# Patient Record
Sex: Male | Born: 1960 | Race: Black or African American | Hispanic: No | Smoking: Current every day smoker
Health system: Southern US, Community
[De-identification: ages and names within clinical notes are randomized; demographics above are authoritative.]

## PROBLEM LIST (undated history)

## (undated) DIAGNOSIS — R519 Headache, unspecified: Secondary | ICD-10-CM

## (undated) DIAGNOSIS — R51 Headache: Secondary | ICD-10-CM

## (undated) DIAGNOSIS — K219 Gastro-esophageal reflux disease without esophagitis: Secondary | ICD-10-CM

## (undated) DIAGNOSIS — R351 Nocturia: Secondary | ICD-10-CM

## (undated) DIAGNOSIS — R972 Elevated prostate specific antigen [PSA]: Secondary | ICD-10-CM

## (undated) DIAGNOSIS — Z973 Presence of spectacles and contact lenses: Secondary | ICD-10-CM

## (undated) DIAGNOSIS — C61 Malignant neoplasm of prostate: Secondary | ICD-10-CM

## (undated) DIAGNOSIS — I10 Essential (primary) hypertension: Secondary | ICD-10-CM

## (undated) HISTORY — PX: HERNIA REPAIR: SHX51

---

## 2002-01-31 ENCOUNTER — Encounter: Payer: Self-pay | Admitting: Emergency Medicine

## 2002-01-31 ENCOUNTER — Emergency Department (HOSPITAL_COMMUNITY): Admission: EM | Admit: 2002-01-31 | Discharge: 2002-01-31 | Payer: Self-pay | Admitting: *Deleted

## 2002-11-10 ENCOUNTER — Emergency Department (HOSPITAL_COMMUNITY): Admission: EM | Admit: 2002-11-10 | Discharge: 2002-11-10 | Payer: Self-pay | Admitting: Emergency Medicine

## 2008-01-19 ENCOUNTER — Emergency Department (HOSPITAL_COMMUNITY): Admission: EM | Admit: 2008-01-19 | Discharge: 2008-01-19 | Payer: Self-pay | Admitting: Emergency Medicine

## 2008-10-30 ENCOUNTER — Emergency Department (HOSPITAL_COMMUNITY): Admission: EM | Admit: 2008-10-30 | Discharge: 2008-10-30 | Payer: Self-pay | Admitting: Emergency Medicine

## 2008-11-04 ENCOUNTER — Emergency Department (HOSPITAL_COMMUNITY): Admission: EM | Admit: 2008-11-04 | Discharge: 2008-11-04 | Payer: Self-pay | Admitting: Emergency Medicine

## 2008-12-03 ENCOUNTER — Emergency Department (HOSPITAL_COMMUNITY): Admission: EM | Admit: 2008-12-03 | Discharge: 2008-12-03 | Payer: Self-pay | Admitting: Emergency Medicine

## 2014-10-09 ENCOUNTER — Other Ambulatory Visit: Payer: Self-pay | Admitting: Urology

## 2014-10-14 ENCOUNTER — Encounter (HOSPITAL_BASED_OUTPATIENT_CLINIC_OR_DEPARTMENT_OTHER): Payer: Self-pay | Admitting: *Deleted

## 2014-10-14 NOTE — Progress Notes (Addendum)
NPO AFTER MN. ARRIVE AT 0945. NEEDS ISTAT AND EKG. WILL DO FLEET ENEMA AM DOS. PT TO CALL BACK WITH NAME OF BP MED. NAME.  PT CALLED BACK WITH BP NAME.  HE WILL TAKE NORVASC AM DOS W/ SIP OF WATER.

## 2014-10-16 ENCOUNTER — Ambulatory Visit (HOSPITAL_BASED_OUTPATIENT_CLINIC_OR_DEPARTMENT_OTHER): Admission: RE | Admit: 2014-10-16 | Payer: 59 | Source: Ambulatory Visit | Admitting: Urology

## 2014-10-16 HISTORY — DX: Presence of spectacles and contact lenses: Z97.3

## 2014-10-16 HISTORY — DX: Essential (primary) hypertension: I10

## 2014-10-16 HISTORY — DX: Nocturia: R35.1

## 2014-10-16 HISTORY — DX: Gastro-esophageal reflux disease without esophagitis: K21.9

## 2014-10-16 HISTORY — DX: Elevated prostate specific antigen (PSA): R97.20

## 2014-10-16 SURGERY — BIOPSY, PROSTATE, RECTAL APPROACH, WITH US GUIDANCE
Anesthesia: General

## 2016-02-11 ENCOUNTER — Ambulatory Visit (INDEPENDENT_AMBULATORY_CARE_PROVIDER_SITE_OTHER): Payer: 59 | Admitting: Physician Assistant

## 2016-02-11 VITALS — BP 120/78 | HR 58 | Temp 100.2°F | Resp 16 | Ht 66.0 in | Wt 196.8 lb

## 2016-02-11 DIAGNOSIS — R059 Cough, unspecified: Secondary | ICD-10-CM

## 2016-02-11 DIAGNOSIS — R05 Cough: Secondary | ICD-10-CM | POA: Diagnosis not present

## 2016-02-11 DIAGNOSIS — J0100 Acute maxillary sinusitis, unspecified: Secondary | ICD-10-CM | POA: Diagnosis not present

## 2016-02-11 MED ORDER — AMOXICILLIN-POT CLAVULANATE 875-125 MG PO TABS: 1.0000 | ORAL_TABLET | Freq: Two times a day (BID) | ORAL | Status: DC

## 2016-02-11 MED ORDER — FLUTICASONE PROPIONATE 50 MCG/ACT NA SUSP: 2.0000 | Freq: Every day | NASAL | Status: DC

## 2016-02-11 MED ORDER — GUAIFENESIN ER 1200 MG PO TB12: 1.0000 | ORAL_TABLET | Freq: Two times a day (BID) | ORAL | Status: AC

## 2016-02-11 MED ORDER — HYDROCOD POLST-CPM POLST ER 10-8 MG/5ML PO SUER: 5.0000 mL | Freq: Two times a day (BID) | ORAL | Status: DC | PRN

## 2016-02-11 NOTE — Progress Notes (Signed)
   Erik Gill  MRN: [id number] DOB: 1961-11-29  Subjective:  Pt presents to clinic with cold symptoms for about 2 weeks.  Started with cough about 2 weeks and congestion but it has continued to get worse.  He is having headaches and sinus pressure and dizziness.  Home treatment - afrin, cough medications Flu vaccine - no  There are no active problems to display for this patient.   Current Outpatient Prescriptions on File Prior to Visit  Medication Sig Dispense Refill  . amLODipine (NORVASC) 10 MG tablet Take 10 mg by mouth every morning.    Marland Kitchen aspirin EC 81 MG tablet Take 81 mg by mouth daily.    . Ca Carbonate-Mag Hydroxide (ROLAIDS PO) Take by mouth as needed.     No current facility-administered medications on file prior to visit.    No Known Allergies  Review of Systems  Constitutional: Positive for chills. Negative for fever.  HENT: Positive for congestion, postnasal drip and rhinorrhea (yellow). Negative for dental problem and sore throat.   Gastrointestinal: Negative.   Musculoskeletal: Negative for myalgias.  Neurological: Positive for headaches.   Objective:  BP 120/78 mmHg  Pulse 58  Temp(Src) 100.2 F (37.9 C) (Oral)  Resp 16  Ht 5\' 6"  (1.676 m)  Wt 196 lb 12.8 oz (89.268 kg)  BMI 31.78 kg/m2  SpO2 98%  Physical Exam  Constitutional: He is oriented to person, place, and time and well-developed, well-nourished, and in no distress.  HENT:  Head: Normocephalic and atraumatic.  Right Ear: Hearing, tympanic membrane, external ear and ear canal normal.  Left Ear: Hearing, tympanic membrane, external ear and ear canal normal.  Nose: Mucosal edema (red) and rhinorrhea present.  Mouth/Throat: Uvula is midline, oropharynx is clear and moist and mucous membranes are normal.  Eyes: Conjunctivae are normal.  Neck: Normal range of motion.  Cardiovascular: Normal rate, regular rhythm and normal heart sounds.   Pulmonary/Chest: Effort normal and breath sounds normal.  He has no wheezes.  Lymphadenopathy:       Head (right side): No tonsillar adenopathy present.       Head (left side): No tonsillar adenopathy present.    He has no cervical adenopathy.       Right: No supraclavicular adenopathy present.       Left: No supraclavicular adenopathy present.  Neurological: He is alert and oriented to person, place, and time. Gait normal.  Skin: Skin is warm and dry.  Psychiatric: Mood, memory, affect and judgment normal.    Assessment and Plan :  Acute maxillary sinusitis, recurrence not specified - Plan: fluticasone (FLONASE) 50 MCG/ACT nasal spray, Guaifenesin (MUCINEX MAXIMUM STRENGTH) 1200 MG TB12, amoxicillin-clavulanate (AUGMENTIN) 875-125 MG tablet, Care order/instruction  Cough - Plan: chlorpheniramine-HYDROcodone (TUSSIONEX PENNKINETIC ER) 10-8 MG/5ML SUER   Symptomatic care discussed with pt to do in combination to the above medications.  He will stop the Afrin as he has already used it for 4 days.  Windell Hummingbird PA-C  [facility] 02/11/2016 7:25 PM

## 2016-02-11 NOTE — Patient Instructions (Signed)
Please push fluids.  Tylenol and Motrin for fever and body aches.    A humidifier can help especially when the air is dry -if you do not have a humidifier you can boil a pot of water on the stove in your home to help with the dry air.

## 2016-02-15 ENCOUNTER — Telehealth: Payer: Self-pay

## 2016-02-15 DIAGNOSIS — R059 Cough, unspecified: Secondary | ICD-10-CM

## 2016-02-15 DIAGNOSIS — R05 Cough: Secondary | ICD-10-CM

## 2016-02-15 NOTE — Telephone Encounter (Signed)
Pt was seen here on 02/11/16 by Weber for Acute maxillary sinusitis, recurrence not specified - Primary. He would like a refill on his chlorpheniramine-HYDROcodone [facility] ER) 10-8 MG/5ML Latanya Presser [id number]. He would like [place] to use Pharmacy:  [id number] - [place], [place]. CB # [phone]

## 2016-02-16 MED ORDER — HYDROCOD POLST-CPM POLST ER 10-8 MG/5ML PO SUER: 5.0000 mL | Freq: Two times a day (BID) | ORAL | Status: DC | PRN

## 2016-02-16 NOTE — Telephone Encounter (Signed)
Pt has used to much medication - he was given a 7 day supply that he used in 5 days -   I am happy to give him some more cough medication but this has to be picked up and he really needs to follow the instructions on the bottle - I have printed it out - if he does not want to pick up the Rx he can get some [facility] over the counter - he just has to ask for it behind the counter.

## 2016-02-16 NOTE — Telephone Encounter (Signed)
  Pt notified and understand.

## 2016-07-12 ENCOUNTER — Ambulatory Visit (INDEPENDENT_AMBULATORY_CARE_PROVIDER_SITE_OTHER): Payer: 59 | Admitting: Family Medicine

## 2016-07-12 VITALS — BP 134/86 | HR 85 | Temp 98.5°F | Resp 17 | Ht 68.5 in | Wt 206.0 lb

## 2016-07-12 DIAGNOSIS — R05 Cough: Secondary | ICD-10-CM | POA: Diagnosis not present

## 2016-07-12 DIAGNOSIS — J0100 Acute maxillary sinusitis, unspecified: Secondary | ICD-10-CM

## 2016-07-12 DIAGNOSIS — Z720 Tobacco use: Secondary | ICD-10-CM | POA: Diagnosis not present

## 2016-07-12 DIAGNOSIS — F17200 Nicotine dependence, unspecified, uncomplicated: Secondary | ICD-10-CM

## 2016-07-12 DIAGNOSIS — J069 Acute upper respiratory infection, unspecified: Secondary | ICD-10-CM

## 2016-07-12 DIAGNOSIS — R059 Cough, unspecified: Secondary | ICD-10-CM

## 2016-07-12 MED ORDER — GUAIFENESIN ER 600 MG PO TB12: 1200.0000 mg | ORAL_TABLET | Freq: Two times a day (BID) | ORAL | 1 refills | Status: DC

## 2016-07-12 MED ORDER — AMOXICILLIN-POT CLAVULANATE 875-125 MG PO TABS: 1.0000 | ORAL_TABLET | Freq: Two times a day (BID) | ORAL | 0 refills | Status: DC

## 2016-07-12 NOTE — Progress Notes (Signed)
Patient ID: Erik Gill, male    DOB: May 01, 1961, 55 y.o.   MRN: [id number]  PCP: No PCP Per Patient  Chief Complaint  Patient presents with  . Cough  . Sinusitis    Subjective:   HPI Presents for evaluation of headache and congestion times 1 week. Nagging headache unrelieved with BC powders and Sudafed.  Complains of post-nasal drainage, which worsens while lying flat.  Cough with small amount phlegm. Denies fever, wheezing, or shortness of breath. Patient is a current everyday smoker and reports a chronic history of recurrent sinus infections and upper respiratory infections.  . Social History   Social History  . Marital status: Single    Spouse name: N/A  . Number of children: N/A  . Years of education: N/A   Occupational History  . Not on file.   Social History Main Topics  . Smoking status: Current Some Day Smoker    Packs/day: 0.50    Years: 1.00    Types: Cigarettes  . Smokeless tobacco: Never Used     Comment: 0.5 PP2D  . Alcohol use No     Comment: OCCASIONAL  . Drug use: No  . Sexual activity: Not on file   Other Topics Concern  . Not on file   Social History Narrative  . No narrative on file   History reviewed. No pertinent family history.   Review of Systems  Constitutional: Negative.   HENT: Positive for congestion, postnasal drip and sinus pressure. Negative for ear pain, mouth sores and sore throat.        See HPI  Respiratory: Positive for cough.        See HPI  Cardiovascular: Negative.   Neurological: Positive for headaches.       See HPI     There are no active problems to display for this patient.    Prior to Admission medications   Medication Sig Start Date End Date Taking? Authorizing Provider  amLODipine (NORVASC) 10 MG tablet Take 10 mg by mouth every morning.   Yes Historical Provider, MD  amoxicillin-clavulanate (AUGMENTIN) 875-125 MG tablet Take 1 tablet by mouth 2 (two) times daily. 02/11/16  Yes Mancel Bale, PA-C    aspirin EC 81 MG tablet Take 81 mg by mouth daily.   Yes Historical Provider, MD  Ca Carbonate-Mag Hydroxide (ROLAIDS PO) Take by mouth as needed.   Yes Historical Provider, MD  fluticasone (FLONASE) 50 MCG/ACT nasal spray Place 2 sprays into both nostrils daily. 02/11/16  Yes Mancel Bale, PA-C  guaiFENesin (MUCINEX) 600 MG 12 hr tablet Take by mouth 2 (two) times daily.   Yes Historical Provider, MD  chlorpheniramine-HYDROcodone (TUSSIONEX PENNKINETIC ER) 10-8 MG/5ML SUER Take 5 mLs by mouth 2 (two) times daily as needed for cough. Patient not taking: Reported on 07/12/2016 02/16/16   Mancel Bale, PA-C     No Known Allergies     Objective:  Physical Exam  Constitutional: He is oriented to person, place, and time.  Cardiovascular: Normal rate, regular rhythm, normal heart sounds and intact distal pulses.   Pulmonary/Chest: Effort normal. No respiratory distress. He has no wheezes. He has no rales. He exhibits tenderness.  Dimnished lung sounds. Rhonchi present anterior upper chest wall.  Clear with cough.  Musculoskeletal: Normal range of motion.  Neurological: He is alert and oriented to person, place, and time.  Skin: Skin is warm and dry.    . Vitals:   07/12/16 1408  BP: 134/86  Pulse: 85  Resp: 17  Temp: 98.5 F (36.9 C)   Assessment & Plan:  1. Acute upper respiratory infection 2. Cough Encourage Smoking Cessation Start taking Guaifenesin 1200 mg, twice daily as needed.   3. Acute maxillary sinusitis, recurrence not specified If sinusitis symptoms continue to persist for 48 hours, facial pressure, headache, nasal congestion, fill prescription and start antibiotic - Amoxicillin-clavulanate (AUGMENTIN) 875-125 MG tablet; Take 1 tablet by mouth 2 (two) times daily.     Follow-up if symptoms worsen or do not improve.  Carroll Sage. Kenton Kingfisher, MSN, FNP-C [facility]

## 2016-07-12 NOTE — Patient Instructions (Addendum)
Start taking Guaifenesin 1200 mg twice daily for cough.  If symptoms do not resolve by Friday 07/13/16 or if you develop of fever, fill prescription for Augmentin antibiotic and start taking 1 tablet by mount two times daily.   IF you received an x-ray today, you will receive an invoice from [facility]. Please contact [facility] at [phone] with questions or concerns regarding your invoice.   IF you received labwork today, you will receive an invoice from [employer]. Please contact [facility] at [phone] with questions or concerns regarding your invoice.   Our billing staff will not be able to assist you with questions regarding bills from these companies.  You will be contacted with the lab results as soon as they are available. The fastest way to get your results is to activate your My Chart account. Instructions are located on the last page of this paperwork. If you have not heard from [place] regarding the results in 2 weeks, please contact this office.     Upper Respiratory Infection, Adult Most upper respiratory infections (URIs) are a viral infection of the air passages leading to the lungs. A URI affects the nose, throat, and upper air passages. The most common type of URI is nasopharyngitis and is typically referred to as "the common cold." URIs run their course and usually go away on their own. Most of the time, a URI does not require medical attention, but sometimes a bacterial infection in the upper airways can follow a viral infection. This is called a secondary infection. Sinus and middle ear infections are common types of secondary upper respiratory infections. Bacterial pneumonia can also complicate a URI. A URI can worsen asthma and chronic obstructive pulmonary disease (COPD). Sometimes, these complications can require emergency medical care and may be life threatening.  CAUSES Almost all URIs are caused by viruses. A virus is a  type of germ and can spread from one person to another.  RISKS FACTORS You may be at risk for a URI if:   You smoke.   You have chronic heart or lung disease.  You have a weakened defense (immune) system.   You are very young or very old.   You have nasal allergies or asthma.  You work in crowded or poorly ventilated areas.  You work in health care facilities or schools. SIGNS AND SYMPTOMS  Symptoms typically develop 2-3 days after you come in contact with a cold virus. Most viral URIs last 7-10 days. However, viral URIs from the influenza virus (flu virus) can last 14-18 days and are typically more severe. Symptoms may include:   Runny or stuffy (congested) nose.   Sneezing.   Cough.   Sore throat.   Headache.   Fatigue.   Fever.   Loss of appetite.   Pain in your forehead, behind your eyes, and over your cheekbones (sinus pain).  Muscle aches.  DIAGNOSIS  Your health care provider may diagnose a URI by:  Physical exam.  Tests to check that your symptoms are not due to another condition such as:  Strep throat.  Sinusitis.  Pneumonia.  Asthma. TREATMENT  A URI goes away on its own with time. It cannot be cured with medicines, but medicines may be prescribed or recommended to relieve symptoms. Medicines may help:  Reduce your fever.  Reduce your cough.  Relieve nasal congestion. HOME CARE INSTRUCTIONS   Take medicines only as directed by your health care provider.   Gargle warm saltwater or take  cough drops to comfort your throat as directed by your health care provider.  Use a warm mist humidifier or inhale steam from a shower to increase air moisture. This may make it easier to breathe.  Drink enough fluid to keep your urine clear or pale yellow.   Eat soups and other clear broths and maintain good nutrition.   Rest as needed.   Return to work when your temperature has returned to normal or as your health care provider  advises. You may need to stay home longer to avoid infecting others. You can also use a face mask and careful hand washing to prevent spread of the virus.  Increase the usage of your inhaler if you have asthma.   Do not use any tobacco products, including cigarettes, chewing tobacco, or electronic cigarettes. If you need help quitting, ask your health care provider. PREVENTION  The best way to protect yourself from getting a cold is to practice good hygiene.   Avoid oral or hand contact with people with cold symptoms.   Wash your hands often if contact occurs.  There is no clear evidence that vitamin C, vitamin E, echinacea, or exercise reduces the chance of developing a cold. However, it is always recommended to get plenty of rest, exercise, and practice good nutrition.  SEEK MEDICAL CARE IF:   You are getting worse rather than better.   Your symptoms are not controlled by medicine.   You have chills.  You have worsening shortness of breath.  You have brown or red mucus.  You have yellow or brown nasal discharge.  You have pain in your face, especially when you bend forward.  You have a fever.  You have swollen neck glands.  You have pain while swallowing.  You have white areas in the back of your throat. SEEK IMMEDIATE MEDICAL CARE IF:   You have severe or persistent:  Headache.  Ear pain.  Sinus pain.  Chest pain.  You have chronic lung disease and any of the following:  Wheezing.  Prolonged cough.  Coughing up blood.  A change in your usual mucus.  You have a stiff neck.  You have changes in your:  Vision.  Hearing.  Thinking.  Mood. MAKE SURE YOU:   Understand these instructions.  Will watch your condition.  Will get help right away if you are not doing well or get worse.   This information is not intended to replace advice given to you by your health care provider. Make sure you discuss any questions you have with your health care  provider.   Document Released: 05/16/2001 Document Revised: 04/06/2015 Document Reviewed: 02/25/2014 Elsevier Interactive Patient Education [employer].

## 2016-07-15 DIAGNOSIS — J329 Chronic sinusitis, unspecified: Secondary | ICD-10-CM | POA: Insufficient documentation

## 2016-07-15 DIAGNOSIS — F17200 Nicotine dependence, unspecified, uncomplicated: Secondary | ICD-10-CM | POA: Insufficient documentation

## 2016-07-15 DIAGNOSIS — I10 Essential (primary) hypertension: Secondary | ICD-10-CM | POA: Insufficient documentation

## 2016-10-23 ENCOUNTER — Ambulatory Visit (INDEPENDENT_AMBULATORY_CARE_PROVIDER_SITE_OTHER): Payer: 59 | Admitting: Urgent Care

## 2016-10-23 VITALS — BP 150/82 | HR 75 | Temp 98.6°F | Resp 20 | Ht 66.5 in | Wt 202.8 lb

## 2016-10-23 DIAGNOSIS — J029 Acute pharyngitis, unspecified: Secondary | ICD-10-CM | POA: Diagnosis not present

## 2016-10-23 DIAGNOSIS — I10 Essential (primary) hypertension: Secondary | ICD-10-CM

## 2016-10-23 DIAGNOSIS — J069 Acute upper respiratory infection, unspecified: Secondary | ICD-10-CM | POA: Diagnosis not present

## 2016-10-23 DIAGNOSIS — R05 Cough: Secondary | ICD-10-CM

## 2016-10-23 DIAGNOSIS — R059 Cough, unspecified: Secondary | ICD-10-CM

## 2016-10-23 DIAGNOSIS — R030 Elevated blood-pressure reading, without diagnosis of hypertension: Secondary | ICD-10-CM | POA: Diagnosis not present

## 2016-10-23 DIAGNOSIS — R0981 Nasal congestion: Secondary | ICD-10-CM

## 2016-10-23 DIAGNOSIS — B9789 Other viral agents as the cause of diseases classified elsewhere: Secondary | ICD-10-CM | POA: Diagnosis not present

## 2016-10-23 MED ORDER — HYDROCODONE-HOMATROPINE 5-1.5 MG/5ML PO SYRP: 5.0000 mL | ORAL_SOLUTION | Freq: Every evening | ORAL | 0 refills | Status: DC | PRN

## 2016-10-23 MED ORDER — PSEUDOEPHEDRINE HCL 60 MG PO TABS: 60.0000 mg | ORAL_TABLET | Freq: Three times a day (TID) | ORAL | 0 refills | Status: DC | PRN

## 2016-10-23 MED ORDER — BENZONATATE 100 MG PO CAPS: 100.0000 mg | ORAL_CAPSULE | Freq: Three times a day (TID) | ORAL | 0 refills | Status: DC | PRN

## 2016-10-23 NOTE — Progress Notes (Signed)
    MRN: [id number] DOB: 11-Jul-1961  Subjective:   Erik Gill is a 55 y.o. male presenting for chief complaint of Cough (some wheezing  x 2 days) and Sinusitis (congestion)  Reports 2 day history of mildly productive cough, nasal congestion, sinus pain, post-nasal drainage, scratchy throat, wheezing, chest congestion, subjective fever once. Has tried Mucinex, Allegra, Flonase, NyQuil with minimal relief. Denies fever, chest pain, shob, n/v, abdominal pain, rashes, body aches. Admits history of allergies. Did not have flu shot this year. Denies history of asthma. Smokes 1/2ppd ~10 years. Has occasional alcohol drink. His HTN managed by Dr. Kenton Kingfisher with [facility].  Erik Gill has a current medication list which includes the following prescription(s): amlodipine, aspirin ec, fexofenadine, fluticasone, and guaifenesin. Also has No Known Allergies.  Erik Gill  has a past medical history of Elevated PSA; GERD (gastroesophageal reflux disease); Hypertension; Nocturia; and Wears glasses. Also  has no past surgical history on file.  Objective:   Vitals: BP (!) 150/82 (BP Location: Right Arm, Patient Position: Sitting, Cuff Size: Large)   Pulse 75   Temp 98.6 F (37 C) (Oral)   Resp 20   Ht 5' 6.5" (1.689 m)   Wt 202 lb 12.8 oz (92 kg)   SpO2 98%   BMI 32.24 kg/m   BP Readings from Last 3 Encounters:  10/23/16 (!) 150/82  07/12/16 134/86  02/11/16 120/78    Physical Exam  Constitutional: He is oriented to person, place, and time. He appears well-developed and well-nourished.  HENT:  TM's intact bilaterally but no effusions or erythema. Nasal turbinates pink and moist without sinus tenderness. Postnasal drip present but without oropharyngeal exudates, erythema or abscesses.  Eyes: Right eye exhibits no discharge. Left eye exhibits no discharge. No scleral icterus.  Neck: Normal range of motion. Neck supple.  Cardiovascular: Normal rate, regular rhythm and intact distal pulses.  Exam reveals no  gallop and no friction rub.   No murmur heard. Pulmonary/Chest: No respiratory distress. He has no wheezes. He has no rales.  Lymphadenopathy:    He has no cervical adenopathy.  Neurological: He is alert and oriented to person, place, and time.  Skin: Skin is warm and dry.   Assessment and Plan :   1. Viral URI with cough 2. Cough 3. Sore throat 4. Nasal congestion - Start supportive care, Hycodan and Tessalon for cough suppression. Continue allergy management with Allegra and Flonase. - If no improvement or symptoms do not resolve return to clinic in 1 week  5. Elevated blood pressure reading 6. Essential hypertension - Recommended patient f/u with PCP for his BP management.  Jaynee Eagles, PA-C [facility] [phone] 10/23/2016 2:55 PM

## 2016-10-23 NOTE — Patient Instructions (Addendum)
Upper Respiratory Infection, Adult Most upper respiratory infections (URIs) are a viral infection of the air passages leading to the lungs. A URI affects the nose, throat, and upper air passages. The most common type of URI is nasopharyngitis and is typically referred to as "the common cold." URIs run their course and usually go away on their own. Most of the time, a URI does not require medical attention, but sometimes a bacterial infection in the upper airways can follow a viral infection. This is called a secondary infection. Sinus and middle ear infections are common types of secondary upper respiratory infections. Bacterial pneumonia can also complicate a URI. A URI can worsen asthma and chronic obstructive pulmonary disease (COPD). Sometimes, these complications can require emergency medical care and may be life threatening. What are the causes? Almost all URIs are caused by viruses. A virus is a type of germ and can spread from one person to another. What increases the risk? You may be at risk for a URI if:  You smoke.  You have chronic heart or lung disease.  You have a weakened defense (immune) system.  You are very young or very old.  You have nasal allergies or asthma.  You work in crowded or poorly ventilated areas.  You work in health care facilities or schools.  What are the signs or symptoms? Symptoms typically develop 2-3 days after you come in contact with a cold virus. Most viral URIs last 7-10 days. However, viral URIs from the influenza virus (flu virus) can last 14-18 days and are typically more severe. Symptoms may include:  Runny or stuffy (congested) nose.  Sneezing.  Cough.  Sore throat.  Headache.  Fatigue.  Fever.  Loss of appetite.  Pain in your forehead, behind your eyes, and over your cheekbones (sinus pain).  Muscle aches.  How is this diagnosed? Your health care provider may diagnose a URI by:  Physical exam.  Tests to check that your  symptoms are not due to another condition such as: ? Strep throat. ? Sinusitis. ? Pneumonia. ? Asthma.  How is this treated? A URI goes away on its own with time. It cannot be cured with medicines, but medicines may be prescribed or recommended to relieve symptoms. Medicines may help:  Reduce your fever.  Reduce your cough.  Relieve nasal congestion.  Follow these instructions at home:  Take medicines only as directed by your health care provider.  Gargle warm saltwater or take cough drops to comfort your throat as directed by your health care provider.  Use a warm mist humidifier or inhale steam from a shower to increase air moisture. This may make it easier to breathe.  Drink enough fluid to keep your urine clear or pale yellow.  Eat soups and other clear broths and maintain good nutrition.  Rest as needed.  Return to work when your temperature has returned to normal or as your health care provider advises. You may need to stay home longer to avoid infecting others. You can also use a face mask and careful hand washing to prevent spread of the virus.  Increase the usage of your inhaler if you have asthma.  Do not use any tobacco products, including cigarettes, chewing tobacco, or electronic cigarettes. If you need help quitting, ask your health care provider. How is this prevented? The best way to protect yourself from getting a cold is to practice good hygiene.  Avoid oral or hand contact with people with cold symptoms.  Wash your   occurs. There is no clear evidence that vitamin C, vitamin E, echinacea, or exercise reduces the chance of developing a cold. However, it is always recommended to get plenty of rest, exercise, and practice good nutrition. Contact a health care provider if:  You are getting worse rather than better.  Your symptoms are not controlled by medicine.  You have chills.  You have worsening shortness of breath.  You have brown  or red mucus.  You have yellow or brown nasal discharge.  You have pain in your face, especially when you bend forward.  You have a fever.  You have swollen neck glands.  You have pain while swallowing.  You have white areas in the back of your throat. Get help right away if:  You have severe or persistent:  Headache.  Ear pain.  Sinus pain.  Chest pain.  You have chronic lung disease and any of the following:  Wheezing.  Prolonged cough.  Coughing up blood.  A change in your usual mucus.  You have a stiff neck.  You have changes in your:  Vision.  Hearing.  Thinking.  Mood. This information is not intended to replace advice given to you by your health care provider. Make sure you discuss any questions you have with your health care provider. Document Released: 05/16/2001 Document Revised: 07/23/2016 Document Reviewed: 02/25/2014 Elsevier Interactive Patient Education  2017 [employer].     IF you received an x-ray today, you will receive an invoice from [facility]. Please contact [facility] at [phone] with questions or concerns regarding your invoice.   IF you received labwork today, you will receive an invoice from [facility] [employer]. Please contact [facility] at [phone] with questions or concerns regarding your invoice.   Our billing staff will not be able to assist you with questions regarding bills from these companies.  You will be contacted with the lab results as soon as they are available. The fastest way to get your results is to activate your My Chart account. Instructions are located on the last page of this paperwork. If you have not heard from [place] regarding the results in 2 weeks, please contact this office.

## 2016-11-28 ENCOUNTER — Ambulatory Visit (INDEPENDENT_AMBULATORY_CARE_PROVIDER_SITE_OTHER): Payer: 59

## 2016-11-28 ENCOUNTER — Ambulatory Visit (INDEPENDENT_AMBULATORY_CARE_PROVIDER_SITE_OTHER): Payer: 59 | Admitting: Physician Assistant

## 2016-11-28 VITALS — BP 150/84 | HR 86 | Temp 97.3°F | Resp 17 | Ht 66.5 in | Wt 208.0 lb

## 2016-11-28 DIAGNOSIS — M545 Low back pain, unspecified: Secondary | ICD-10-CM

## 2016-11-28 LAB — POCT URINALYSIS DIP (MANUAL ENTRY)
Bilirubin, UA: NEGATIVE
GLUCOSE UA: NEGATIVE
Ketones, POC UA: NEGATIVE
Leukocytes, UA: NEGATIVE
Nitrite, UA: NEGATIVE
PROTEIN UA: NEGATIVE
RBC UA: NEGATIVE
UROBILINOGEN UA: 0.2
pH, UA: 7

## 2016-11-28 LAB — POC MICROSCOPIC URINALYSIS (UMFC): MUCUS RE: ABSENT

## 2016-11-28 MED ORDER — CYCLOBENZAPRINE HCL 5 MG PO TABS: 5.0000 mg | ORAL_TABLET | Freq: Three times a day (TID) | ORAL | 0 refills | Status: DC | PRN

## 2016-11-28 MED ORDER — MELOXICAM 15 MG PO TABS: 15.0000 mg | ORAL_TABLET | Freq: Every day | ORAL | 1 refills | Status: DC

## 2016-11-28 NOTE — Progress Notes (Signed)
Erik Gill  MRN: [id number] DOB: 1961/01/10  Subjective:  Erik Gill is a 55 y.o. male seen in office today for a chief complaint of constant bilateral back pain x 5 days. Has associated intermittent radiculopathy. Denies hematuria, bladder/bowel incontinence, or acute injury. Pt does work for [employer] and is constantly lifting heavy objects at work. He was on vacation a week ago and then went back and noticed the pain soon after returning. It is worsened by sitting for long periods of time and made better with lying down. He has not tried anything for relief.   Review of Systems  Constitutional: Negative for chills and fever.  Gastrointestinal: Negative for diarrhea, nausea and vomiting.  Genitourinary: Negative for dysuria, flank pain and urgency.  Neurological: Negative for weakness and numbness.    Patient Active Problem List   Diagnosis Date Noted  . Sinusitis, chronic 07/15/2016  . Smoker 07/15/2016  . Essential hypertension 07/15/2016    Current Outpatient Prescriptions on File Prior to Visit  Medication Sig Dispense Refill  . amLODipine-benazepril (LOTREL) 10-40 MG capsule Take 1 capsule by mouth daily.    Marland Kitchen aspirin EC 81 MG tablet Take 81 mg by mouth daily.    Marland Kitchen CIALIS 20 MG tablet Take 1 tablet by mouth daily as needed.    . fexofenadine (ALLEGRA) 60 MG tablet Take 60 mg by mouth 2 (two) times daily.    . fluticasone (FLONASE) 50 MCG/ACT nasal spray Place 2 sprays into both nostrils daily. 16 g 6   No current facility-administered medications on file prior to visit.     No Known Allergies     Social History   Social History  . Marital status: Single    Spouse name: N/A  . Number of children: N/A  . Years of education: N/A   Occupational History  . Not on file.   Social History Main Topics  . Smoking status: Current Some Day Smoker    Packs/day: 0.50    Years: 1.00    Types: Cigarettes  . Smokeless tobacco: Never Used     Comment: 0.5  PP2D  . Alcohol use No     Comment: OCCASIONAL  . Drug use: No  . Sexual activity: Not on file   Other Topics Concern  . Not on file   Social History Narrative  . No narrative on file    Objective:  BP (!) 150/84   Pulse 86   Temp 97.3 F (36.3 C) (Oral)   Resp 17   Ht 5' 6.5" (1.689 m)   Wt 208 lb (94.3 kg)   SpO2 98%   BMI 33.07 kg/m   Physical Exam  Constitutional: He is oriented to person, place, and time and well-developed, well-nourished, and in no distress.  HENT:  Head: Normocephalic and atraumatic.  Eyes: Conjunctivae are normal.  Neck: Normal range of motion.  Cardiovascular: Exam reveals no gallop.   Pulmonary/Chest: Effort normal.  Abdominal: There is no CVA tenderness.  Musculoskeletal:       Lumbar back: He exhibits tenderness ( bilateral musculature) and bony tenderness. He exhibits normal range of motion and no swelling.  Neurological: He is alert and oriented to person, place, and time. He has normal strength. He has a normal Straight Leg Raise Test. Gait normal. Gait normal.  Reflex Scores:      Patellar reflexes are 2+ on the right side and 2+ on the left side. Skin: Skin is warm and dry.  Psychiatric: Affect normal.  Vitals reviewed.   Dg Lumbar Spine Complete  Result Date: 11/28/2016 CLINICAL DATA:  Back pain. EXAM: LUMBAR SPINE - COMPLETE 4+ VIEW COMPARISON:  No prior . FINDINGS: Diffuse multilevel degenerative change. No acute abnormality. 5 mm anterolisthesis L4-L5 . 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 Electronically Signed   By: Marcello Moores  Register   On: 11/28/2016 12:32    Results for orders placed or performed in visit on 11/28/16 (from the past 24 hour(s))  POCT urinalysis dipstick     Status: None   Collection Time: 11/28/16 12:27 PM  Result Value Ref Range   Color, UA yellow yellow   Clarity, UA clear clear   Glucose, UA negative negative   Bilirubin, UA negative negative   Ketones,  POC UA negative negative   Spec Grav, UA <=1.005    Blood, UA negative negative   pH, UA 7.0    Protein Ur, POC negative negative   Urobilinogen, UA 0.2    Nitrite, UA Negative Negative   Leukocytes, UA Negative Negative  POCT Microscopic Urinalysis ([facility])     Status: Abnormal   Collection Time: 11/28/16 12:33 PM  Result Value Ref Range   WBC,UR,HPF,POC None None WBC/hpf   RBC,UR,HPF,POC None None RBC/hpf   Bacteria None None, Too numerous to count   Mucus Absent Absent   Epithelial Cells, UR Per Microscopy Few (A) None, Too numerous to count cells/hpf     Assessment and Plan :   1. Acute bilateral low back pain without sciatica Will treat conservatively with NSAIDs, muscle relaxant, ice, rest, and stretching. - POCT urinalysis dipstick - POCT Microscopic Urinalysis ([facility]) - DG Lumbar Spine Complete; Future - meloxicam (MOBIC) 15 MG tablet; Take 1 tablet (15 mg total) by mouth daily.  Dispense: 30 tablet; Refill: 1 - cyclobenzaprine (FLEXERIL) 5 MG tablet; Take 1 tablet (5 mg total) by mouth 3 (three) times daily as needed for muscle spasms.  Dispense: 60 tablet; Refill: 0 -Return to clinic if symptoms worsen, do not improve in 2 weeks, or as needed   Tenna Delaine PA-C  [facility] 11/28/2016 12:50 PM

## 2016-11-28 NOTE — Patient Instructions (Addendum)
I recommend resting today. However, tomorrow I would begin walking and moving around as much as tolerated. Begin stretching in a couple of days. The worse thing you can do for low back pain is lie in bed all day or sit down all day. Use medications as needed.   Just to know, flexeril can cause side effects that may impair your thinking or reactions. Be careful if you drive or do anything that requires you to be awake and alert. void drinking alcohol, which can increase some of the side effects of Flexeril.  NSAIDs like meloxicam have common side effects of heartburn, stomach pain, indigestion, and headache. Could lead to renal insufficiency, stroke, or GI bleed if taken excess amounts outside of what is recommended on label long term.    You should avoid heavy lifting or strenuous repetitive activity to prevent recurrence of event. Experiment with both ice and heat and choose whichever feels best for you.  Use heat pad or ice pack, do not apply directly to skin, use barrier such as towel over the skin. Leave on for 15-20 minutes, 3-4 times a day.  Please perform exercises below. Stretches are to be performed for 2 sets, holding 10-15 seconds each. Recommended to perform this rehab twice daily within pain tolerance for 2 weeks.   FLEXION RANGE OF MOTION AND STRETCHING EXERCISES: STRETCH - Flexion, Single Knee to Chest   Lie on a firm bed or floor with both legs extended in front of you.  Keeping one leg in contact with the floor, bring your opposite knee to your chest. Hold your leg in place by either grabbing behind your thigh or at your knee.  Pull until you feel a gentle stretch in your lower back.   Slowly release your grasp and repeat the exercise with the opposite side.  STRETCH - Flexion, Double Knee to Chest   Lie on a firm bed or floor with both legs extended in front of you.  Keeping one leg in contact with the floor, bring your opposite knee to your chest.  Tense your stomach  muscles to support your back and then lift your other knee to your chest. Hold your legs in place by either grabbing behind your thighs or at your knees.  Pull both knees toward your chest until you feel a gentle stretch in your lower back.   Tense your stomach muscles and slowly return one leg at a time to the floor.  STRETCH - Low Trunk Rotation  Lie on a firm bed or floor. Keeping your legs in front of you, bend your knees so they are both pointed toward the ceiling and your feet are flat on the floor.  Extend your arms out to the side. This will stabilize your upper body by keeping your shoulders in contact with the floor.  Gently and slowly drop both knees together to one side until you feel a gentle stretch in your lower back.   Tense your stomach muscles to support your lower back as you bring your knees back to the starting position. Repeat the exercise to the other side.   EXTENSION RANGE OF MOTION AND FLEXIBILITY EXERCISES: STRETCH - Extension, Prone on Elbows   Lie on your stomach on the floor, a bed will be too soft. Place your palms about shoulder width apart and at the height of your head.  Place your elbows under your shoulders. If this is too painful, stack pillows under your chest.  Allow your body to relax   relax so that your hips drop lower and make contact more completely with the floor.  Slowly return to lying flat on the floor.  RANGE OF MOTION - Extension, Prone Press Ups  Lie on your stomach on the floor, a bed will be too soft. Place your palms about shoulder width apart and at the height of your head.  Keeping your back as relaxed as possible, slowly straighten your elbows while keeping your hips on the floor. You may adjust the placement of your hands to maximize your comfort. As you gain motion, your hands will come more underneath your shoulders.  Slowly return to lying flat on the floor.  RANGE OF MOTION- Quadruped, Neutral Spine   Assume a hands  and knees position on a firm surface. Keep your hands under your shoulders and your knees under your hips. You may place padding under your knees for comfort.  Drop your head and point your tail bone toward the ground below you. This will round out your lower back like an angry cat.    Slowly lift your head and release your tail bone so that your back sags into a large arch, like an old horse.  Repeat this until you feel limber in your lower back.  Now, find your "sweet spot." This will be the most comfortable position somewhere between the two previous positions. This is your neutral spine. Once you have found this position, tense your stomach muscles to support your lower back.  STRENGTHENING EXERCISES - Low Back Strain These exercises may help you when beginning to rehabilitate your injury. These exercises should be done near your "sweet spot." This is the neutral, low-back arch, somewhere between fully rounded and fully arched, that is your least painful position. When performed in this safe range of motion, these exercises can be used for people who have either a flexion or extension based injury. These exercises may resolve your symptoms with or without further involvement from your physician, physical therapist or athletic trainer. While completing these exercises, remember:   Muscles can gain both the endurance and the strength needed for everyday activities through controlled exercises.  Complete these exercises as instructed by your physician, physical therapist or athletic trainer. Increase the resistance and repetitions only as guided.  You may experience muscle soreness or fatigue, but the pain or discomfort you are trying to eliminate should never worsen during these exercises. If this pain does worsen, stop and make certain you are following the directions exactly. If the pain is still present after adjustments, discontinue the exercise until you can discuss the trouble with your  caregiver.  STRENGTHENING - Deep Abdominals, Pelvic Tilt  Lie on a firm bed or floor. Keeping your legs in front of you, bend your knees so they are both pointed toward the ceiling and your feet are flat on the floor.  Tense your lower abdominal muscles to press your lower back into the floor. This motion will rotate your pelvis so that your tail bone is scooping upwards rather than pointing at your feet or into the floor.  STRENGTHENING - Abdominals, Crunches   Lie on a firm bed or floor. Keeping your legs in front of you, bend your knees so they are both pointed toward the ceiling and your feet are flat on the floor. Cross your arms over your chest.  Slightly tip your chin down without bending your neck.  Tense your abdominals and slowly lift your trunk high enough to just clear your shoulder blades.  Lifting higher can put excessive stress on the lower back and does not further strengthen your abdominal muscles.  Control your return to the starting position.  STRENGTHENING - Quadruped, Opposite UE/LE Lift   Assume a hands and knees position on a firm surface. Keep your hands under your shoulders and your knees under your hips. You may place padding under your knees for comfort.  Find your neutral spine and gently tense your abdominal muscles so that you can maintain this position. Your shoulders and hips should form a rectangle that is parallel with the floor and is not twisted.  Keeping your trunk steady, lift your right hand no higher than your shoulder and then your left leg no higher than your hip. Make sure you are not holding your breath.   Continuing to keep your abdominal muscles tense and your back steady, slowly return to your starting position. Repeat with the opposite arm and leg.  STRENGTHENING - Lower Abdominals, Double Knee Lift  Lie on a firm bed or floor. Keeping your legs in front of you, bend your knees so they are both pointed toward the ceiling and your feet are  flat on the floor.  Tense your abdominal muscles to brace your lower back and slowly lift both of your knees until they come over your hips. Be certain not to hold your breath.  POSTURE AND BODY MECHANICS CONSIDERATIONS - Low Back Strain Keeping correct posture when sitting, standing or completing your activities will reduce the stress put on different body tissues, allowing injured tissues a chance to heal and limiting painful experiences. The following are general guidelines for improved posture. Your physician or physical therapist will provide you with any instructions specific to your needs. While reading these guidelines, remember:  The exercises prescribed by your provider will help you have the flexibility and strength to maintain correct postures.  The correct posture provides the best environment for your joints to work. All of your joints have less wear and tear when properly supported by a spine with good posture. This means you will experience a healthier, less painful body.  Correct posture must be practiced with all of your activities, especially prolonged sitting and standing. Correct posture is as important when doing repetitive low-stress activities (typing) as it is when doing a single heavy-load activity (lifting). RESTING POSITIONS Consider which positions are most painful for you when choosing a resting position. If you have pain with flexion-based activities (sitting, bending, stooping, squatting), choose a position that allows you to rest in a less flexed posture. You would want to avoid curling into a fetal position on your side. If your pain worsens with extension-based activities (prolonged standing, working overhead), avoid resting in an extended position such as sleeping on your stomach. Most people will find more comfort when they rest with their spine in a more neutral position, neither too rounded nor too arched. Lying on a non-sagging bed on your side with a pillow  between your knees, or on your back with a pillow under your knees will often provide some relief. Keep in mind, being in any one position for a prolonged period of time, no matter how correct your posture, can still lead to stiffness. PROPER SITTING POSTURE In order to minimize stress and discomfort on your spine, you must sit with correct posture. Sitting with good posture should be effortless for a healthy body. Returning to good posture is a gradual process. Many people can work toward this most comfortably by using various   supports until they have the flexibility and strength to maintain this posture on their own. When sitting with proper posture, your ears will fall over your shoulders and your shoulders will fall over your hips. You should use the back of the chair to support your upper back. Your lower back will be in a neutral position, just slightly arched. You may place a small pillow or folded towel at the base of your lower back for support.  When working at a desk, create an environment that supports good, upright posture. Without extra support, muscles tire, which leads to excessive strain on joints and other tissues. Keep these recommendations in mind: CHAIR:  A chair should be able to slide under your desk when your back makes contact with the back of the chair. This allows you to work closely.  The chair's height should allow your eyes to be level with the upper part of your monitor and your hands to be slightly lower than your elbows. BODY POSITION  Your feet should make contact with the floor. If this is not possible, use a foot rest.  Keep your ears over your shoulders. This will reduce stress on your neck and lower back. INCORRECT SITTING POSTURES  If you are feeling tired and unable to assume a healthy sitting posture, do not slouch or slump. This puts excessive strain on your back tissues, causing more damage and pain. Healthier options include:  Using more support, like a  lumbar pillow.  Switching tasks to something that requires you to be upright or walking.  Talking a brief walk.  Lying down to rest in a neutral-spine position. PROLONGED STANDING WHILE SLIGHTLY LEANING FORWARD  When completing a task that requires you to lean forward while standing in one place for a long time, place either foot up on a stationary 2-4 inch high object to help maintain the best posture. When both feet are on the ground, the lower back tends to lose its slight inward curve. If this curve flattens (or becomes too large), then the back and your other joints will experience too much stress, tire more quickly, and can cause pain. CORRECT STANDING POSTURES Proper standing posture should be assumed with all daily activities, even if they only take a few moments, like when brushing your teeth. As in sitting, your ears should fall over your shoulders and your shoulders should fall over your hips. You should keep a slight tension in your abdominal muscles to brace your spine. Your tailbone should point down to the ground, not behind your body, resulting in an over-extended swayback posture.  INCORRECT STANDING POSTURES  Common incorrect standing postures include a forward head, locked knees and/or an excessive swayback. WALKING Walk with an upright posture. Your ears, shoulders and hips should all line-up. PROLONGED ACTIVITY IN A FLEXED POSITION When completing a task that requires you to bend forward at your waist or lean over a low surface, try to find a way to stabilize 3 out of 4 of your limbs. You can place a hand or elbow on your thigh or rest a knee on the surface you are reaching across. This will provide you more stability so that your muscles do not fatigue as quickly. By keeping your knees relaxed, or slightly bent, you will also reduce stress across your lower back. CORRECT LIFTING TECHNIQUES DO :   Assume a wide stance. This will provide you more stability and the opportunity  to get as close as possible to the object which you   lifting.  Tense your abdominals to brace your spine. Bend at the knees and hips. Keeping your back locked in a neutral-spine position, lift using your leg muscles. Lift with your legs, keeping your back straight.  Test the weight of unknown objects before attempting to lift them.  Try to keep your elbows locked down at your sides in order get the best strength from your shoulders when carrying an object.  Always ask for help when lifting heavy or awkward objects. INCORRECT LIFTING TECHNIQUES DO NOT:   Lock your knees when lifting, even if it is a small object.  Bend and twist. Pivot at your feet or move your feet when needing to change directions.  Assume that you can safely pick up even a paper clip without proper posture.       IF you received an x-ray today, you will receive an invoice from [facility]. Please contact [facility] at [phone] with questions or concerns regarding your invoice.   IF you received labwork today, you will receive an invoice from [facility]. Please contact [facility] at [phone] with questions or concerns regarding your invoice.   Our billing staff will not be able to assist you with questions regarding bills from these companies.  You will be contacted with the lab results as soon as they are available. The fastest way to get your results is to activate your My Chart account. Instructions are located on the last page of this paperwork. If you have not heard from us regarding the results in 2 weeks, please contact this office.     

## 2016-12-14 ENCOUNTER — Other Ambulatory Visit: Payer: Self-pay | Admitting: Physician Assistant

## 2016-12-14 DIAGNOSIS — J0100 Acute maxillary sinusitis, unspecified: Secondary | ICD-10-CM

## 2017-01-30 ENCOUNTER — Other Ambulatory Visit: Payer: Self-pay | Admitting: Physician Assistant

## 2017-01-30 DIAGNOSIS — J0100 Acute maxillary sinusitis, unspecified: Secondary | ICD-10-CM

## 2017-04-11 ENCOUNTER — Other Ambulatory Visit: Payer: Self-pay | Admitting: Physician Assistant

## 2017-04-11 DIAGNOSIS — J0100 Acute maxillary sinusitis, unspecified: Secondary | ICD-10-CM

## 2017-05-22 ENCOUNTER — Other Ambulatory Visit: Payer: Self-pay | Admitting: Urgent Care

## 2017-05-22 DIAGNOSIS — J0100 Acute maxillary sinusitis, unspecified: Secondary | ICD-10-CM

## 2017-06-18 ENCOUNTER — Other Ambulatory Visit: Payer: Self-pay | Admitting: Urgent Care

## 2017-06-18 DIAGNOSIS — J0100 Acute maxillary sinusitis, unspecified: Secondary | ICD-10-CM

## 2017-07-03 ENCOUNTER — Other Ambulatory Visit: Payer: Self-pay | Admitting: Urgent Care

## 2017-07-03 DIAGNOSIS — J0100 Acute maxillary sinusitis, unspecified: Secondary | ICD-10-CM

## 2017-07-10 ENCOUNTER — Other Ambulatory Visit: Payer: Self-pay | Admitting: Urgent Care

## 2017-07-10 DIAGNOSIS — J0100 Acute maxillary sinusitis, unspecified: Secondary | ICD-10-CM

## 2017-08-15 ENCOUNTER — Other Ambulatory Visit (HOSPITAL_COMMUNITY): Payer: Self-pay | Admitting: Urology

## 2017-08-15 ENCOUNTER — Other Ambulatory Visit: Payer: Self-pay | Admitting: Urology

## 2017-08-15 DIAGNOSIS — R972 Elevated prostate specific antigen [PSA]: Secondary | ICD-10-CM

## 2017-08-23 ENCOUNTER — Encounter (HOSPITAL_BASED_OUTPATIENT_CLINIC_OR_DEPARTMENT_OTHER): Payer: Self-pay | Admitting: *Deleted

## 2017-08-23 NOTE — Progress Notes (Signed)
Pt instructed npo pmn 9/24 to [facility] 9/26 @ 0600.  Needs istat , ?ekg on arrival.  Pt aware to do fleets enema am of surgery.

## 2017-08-28 ENCOUNTER — Other Ambulatory Visit: Payer: Self-pay | Admitting: General Surgery

## 2017-08-28 NOTE — H&P (Signed)
Urology Preoperative H&P   Chief Complaint: Elevated PSA  History of Present Illness: Erik Gill is a 56 y.o. male with an elevated PSA value of 23.2 (06/2017).  He denies urinary urgency/freqeuncy, recurrent UTIs, dysuria or hematuria.  He has a strong family history of prostate cancer involving his father and his brother.  The patient was previously scheduled to undergo a prostate biopsy in 2015 when his PSA value was 10.6 (09/2014).    Past Medical History:  Diagnosis Date  . Elevated PSA   . GERD (gastroesophageal reflux disease)   . Headache    sinus related  . Hypertension   . Nocturia   . Wears glasses     Past Surgical History:  Procedure Laterality Date  . HERNIA REPAIR Right     Allergies: No Known Allergies  History reviewed. No pertinent family history.  Social History:  reports that he has been smoking Cigarettes.  He has a 0.50 pack-year smoking history. He has never used smokeless tobacco. He reports that he drinks alcohol. He reports that he does not use drugs.  ROS: A complete review of systems was performed.  All systems are negative except for pertinent findings as noted.  Physical Exam:  Vital signs in last 24 hours:   Constitutional:  Alert and oriented, No acute distress Cardiovascular: Regular rate and rhythm, No JVD Respiratory: Normal respiratory effort, Lungs clear bilaterally GI: Abdomen is soft, nontender, nondistended, no abdominal masses GU: No CVA tenderness Lymphatic: No lymphadenopathy Neurologic: Grossly intact, no focal deficits Psychiatric: Normal mood and affect  Laboratory Data:  No results for input(s): WBC, HGB, HCT, PLT in the last 72 hours.  No results for input(s): NA, K, CL, GLUCOSE, BUN, CALCIUM, CREATININE in the last 72 hours.  Invalid input(s): CO3   No results found for this or any previous visit (from the past 24 hour(s)). No results found for this or any previous visit (from the past 240 hour(s)).  Renal  Function: No results for input(s): CREATININE in the last 168 hours. CrCl cannot be calculated (No order found.).  Radiologic Imaging: No results found.  I independently reviewed the above imaging studies.  Assessment and Plan Erik Gill is a 56 y.o. male with an elevated PSA value   -The risks, benefits and alternatives of transrectal ultrasound guided prostate needle biopsy was discussed with the patient.  Risks included, but are not limited to persistent bleeding per rectum, hematuria, hematospermia, UTI/urosepsis requiring prolonged antibiotics, urinary retention and dysuria.  The patient voices understanding and wishes to proceed.    Ellison Hughs, MD 08/28/2017, 1:39 PM  [facility] Pager: [phone]

## 2017-08-28 NOTE — Anesthesia Preprocedure Evaluation (Signed)
Anesthesia Evaluation  Patient identified by MRN, date of birth, ID band Patient awake    Reviewed: Allergy & Precautions, NPO status , Patient's Chart, lab work & pertinent test results  Airway Mallampati: II  TM Distance: >3 FB Neck ROM: Full    Dental no notable dental hx.    Pulmonary neg pulmonary ROS, Current Smoker,    Pulmonary exam normal breath sounds clear to auscultation       Cardiovascular hypertension, Normal cardiovascular exam Rhythm:Regular Rate:Normal     Neuro/Psych negative neurological ROS  negative psych ROS   GI/Hepatic negative GI ROS, Neg liver ROS,   Endo/Other  negative endocrine ROS  Renal/GU negative Renal ROS  negative genitourinary   Musculoskeletal negative musculoskeletal ROS (+)   Abdominal   Peds negative pediatric ROS (+)  Hematology negative hematology ROS (+)   Anesthesia Other Findings   Reproductive/Obstetrics negative OB ROS                             Anesthesia Physical Anesthesia Plan  ASA: II  Anesthesia Plan: MAC   Post-op Pain Management:    Induction: Intravenous  PONV Risk Score and Plan: 0  Airway Management Planned: Simple Face Mask  Additional Equipment:   Intra-op Plan:   Post-operative Plan:   Informed Consent: I have reviewed the patients History and Physical, chart, labs and discussed the procedure including the risks, benefits and alternatives for the proposed anesthesia with the patient or authorized representative who has indicated his/her understanding and acceptance.   Dental advisory given  Plan Discussed with: CRNA and Surgeon  Anesthesia Plan Comments:         Anesthesia Quick Evaluation

## 2017-08-29 ENCOUNTER — Encounter (HOSPITAL_BASED_OUTPATIENT_CLINIC_OR_DEPARTMENT_OTHER): Payer: Self-pay | Admitting: *Deleted

## 2017-08-29 ENCOUNTER — Ambulatory Visit (HOSPITAL_BASED_OUTPATIENT_CLINIC_OR_DEPARTMENT_OTHER): Payer: 59 | Admitting: Anesthesiology

## 2017-08-29 ENCOUNTER — Ambulatory Visit (HOSPITAL_COMMUNITY)
Admission: RE | Admit: 2017-08-29 | Discharge: 2017-08-29 | Disposition: A | Discharge status: Home / Self Care | Payer: 59 | Source: Ambulatory Visit | Attending: Urology | Admitting: Urology

## 2017-08-29 ENCOUNTER — Encounter (HOSPITAL_BASED_OUTPATIENT_CLINIC_OR_DEPARTMENT_OTHER)
Admission: RE | Disposition: A | Discharge status: Home / Self Care | Payer: Self-pay | Source: Ambulatory Visit | Attending: Urology

## 2017-08-29 DIAGNOSIS — K219 Gastro-esophageal reflux disease without esophagitis: Secondary | ICD-10-CM | POA: Diagnosis not present

## 2017-08-29 DIAGNOSIS — Z8042 Family history of malignant neoplasm of prostate: Secondary | ICD-10-CM | POA: Diagnosis not present

## 2017-08-29 DIAGNOSIS — R972 Elevated prostate specific antigen [PSA]: Secondary | ICD-10-CM | POA: Diagnosis present

## 2017-08-29 DIAGNOSIS — C61 Malignant neoplasm of prostate: Secondary | ICD-10-CM | POA: Insufficient documentation

## 2017-08-29 DIAGNOSIS — I10 Essential (primary) hypertension: Secondary | ICD-10-CM | POA: Insufficient documentation

## 2017-08-29 HISTORY — PX: PROSTATE BIOPSY: SHX241

## 2017-08-29 HISTORY — DX: Headache: R51

## 2017-08-29 HISTORY — DX: Headache, unspecified: R51.9

## 2017-08-29 LAB — POCT I-STAT 4, (NA,K, GLUC, HGB,HCT)
Glucose, Bld: 117 mg/dL — ABNORMAL HIGH (ref 65–99)
HEMATOCRIT: 42 % (ref 39.0–52.0)
HEMOGLOBIN: 14.3 g/dL (ref 13.0–17.0)
Potassium: 4 mmol/L (ref 3.5–5.1)
SODIUM: 140 mmol/L (ref 135–145)

## 2017-08-29 SURGERY — BIOPSY, PROSTATE, RECTAL APPROACH, WITH US GUIDANCE
Anesthesia: Monitor Anesthesia Care | Site: Prostate

## 2017-08-29 MED ORDER — FENTANYL CITRATE (PF) 100 MCG/2ML IJ SOLN
INTRAMUSCULAR | Status: AC
Start: 2017-08-29 — End: 2017-08-29
  Filled 2017-08-29: qty 2

## 2017-08-29 MED ORDER — CIPROFLOXACIN IN D5W 400 MG/200ML IV SOLN
400.0000 mg | Freq: Once | INTRAVENOUS | Status: AC
  Administered 2017-08-29: 400 mg via INTRAVENOUS
  Filled 2017-08-29: qty 200

## 2017-08-29 MED ORDER — CEFTRIAXONE SODIUM 2 G IJ SOLR
INTRAMUSCULAR | Status: AC
  Filled 2017-08-29: qty 2

## 2017-08-29 MED ORDER — LIDOCAINE HCL 2 % IJ SOLN
INTRAMUSCULAR | Status: DC | PRN
  Administered 2017-08-29: 10 mL

## 2017-08-29 MED ORDER — LIDOCAINE 2% (20 MG/ML) 5 ML SYRINGE
INTRAMUSCULAR | Status: DC | PRN
  Administered 2017-08-29: 40 mg via INTRAVENOUS

## 2017-08-29 MED ORDER — PROPOFOL 500 MG/50ML IV EMUL
INTRAVENOUS | Status: AC
  Filled 2017-08-29: qty 50

## 2017-08-29 MED ORDER — DEXTROSE 5 % IV SOLN
1.0000 g | Freq: Once | INTRAVENOUS | Status: AC
  Administered 2017-08-29: 2 g via INTRAVENOUS
  Filled 2017-08-29: qty 10

## 2017-08-29 MED ORDER — HYDROCODONE-ACETAMINOPHEN 5-325 MG PO TABS: 1.0000 | ORAL_TABLET | Freq: Four times a day (QID) | ORAL | 0 refills | Status: DC | PRN

## 2017-08-29 MED ORDER — DEXTROSE 5 % IV SOLN
INTRAVENOUS | Status: AC
  Filled 2017-08-29: qty 50

## 2017-08-29 MED ORDER — DEXAMETHASONE SODIUM PHOSPHATE 4 MG/ML IJ SOLN
INTRAMUSCULAR | Status: DC | PRN
  Administered 2017-08-29: 5 mg via INTRAVENOUS

## 2017-08-29 MED ORDER — ONDANSETRON HCL 4 MG/2ML IJ SOLN
INTRAMUSCULAR | Status: AC
  Filled 2017-08-29: qty 2

## 2017-08-29 MED ORDER — CIPROFLOXACIN IN D5W 400 MG/200ML IV SOLN
INTRAVENOUS | Status: AC
  Filled 2017-08-29: qty 200

## 2017-08-29 MED ORDER — ARTIFICIAL TEARS OPHTHALMIC OINT
TOPICAL_OINTMENT | OPHTHALMIC | Status: AC
  Filled 2017-08-29: qty 3.5

## 2017-08-29 MED ORDER — FENTANYL CITRATE (PF) 100 MCG/2ML IJ SOLN
25.0000 ug | INTRAMUSCULAR | Status: DC | PRN
  Filled 2017-08-29: qty 1

## 2017-08-29 MED ORDER — SODIUM CHLORIDE 0.9 % IV SOLN
INTRAVENOUS | Status: DC
  Administered 2017-08-29: 06:00:00 via INTRAVENOUS
  Filled 2017-08-29: qty 1000

## 2017-08-29 MED ORDER — LIDOCAINE 2% (20 MG/ML) 5 ML SYRINGE
INTRAMUSCULAR | Status: AC
  Filled 2017-08-29: qty 5

## 2017-08-29 MED ORDER — FENTANYL CITRATE (PF) 100 MCG/2ML IJ SOLN
INTRAMUSCULAR | Status: DC | PRN
  Administered 2017-08-29: 25 ug via INTRAVENOUS

## 2017-08-29 MED ORDER — PROPOFOL 500 MG/50ML IV EMUL
INTRAVENOUS | Status: DC | PRN
  Administered 2017-08-29: 300 ug/kg/min via INTRAVENOUS

## 2017-08-29 MED ORDER — MIDAZOLAM HCL 5 MG/5ML IJ SOLN
INTRAMUSCULAR | Status: DC | PRN
  Administered 2017-08-29: 2 mg via INTRAVENOUS

## 2017-08-29 MED ORDER — PROMETHAZINE HCL 25 MG/ML IJ SOLN
6.2500 mg | INTRAMUSCULAR | Status: DC | PRN
  Filled 2017-08-29: qty 1

## 2017-08-29 MED ORDER — FLEET ENEMA 7-19 GM/118ML RE ENEM
1.0000 | ENEMA | Freq: Once | RECTAL | Status: DC
  Filled 2017-08-29: qty 1

## 2017-08-29 MED ORDER — DEXAMETHASONE SODIUM PHOSPHATE 10 MG/ML IJ SOLN
INTRAMUSCULAR | Status: AC
  Filled 2017-08-29: qty 1

## 2017-08-29 MED ORDER — EPHEDRINE 5 MG/ML INJ
INTRAVENOUS | Status: AC
  Filled 2017-08-29: qty 10

## 2017-08-29 MED ORDER — ONDANSETRON HCL 4 MG/2ML IJ SOLN
INTRAMUSCULAR | Status: DC | PRN
  Administered 2017-08-29: 4 mg via INTRAVENOUS

## 2017-08-29 MED ORDER — MIDAZOLAM HCL 2 MG/2ML IJ SOLN
INTRAMUSCULAR | Status: AC
  Filled 2017-08-29: qty 2

## 2017-08-29 SURGICAL SUPPLY — 11 items
CLOTH BEACON ORANGE TIMEOUT ST (SAFETY) IMPLANT
INST BIOPSY MAXCORE 18GX25 (NEEDLE) ×2 IMPLANT
INSTR BIOPSY MAXCORE 18GX20 (NEEDLE) IMPLANT
KIT RM TURNOVER CYSTO AR (KITS) ×2 IMPLANT
NDL SAFETY ECLIPSE 18X1.5 (NEEDLE) ×1 IMPLANT
NEEDLE HYPO 18GX1.5 SHARP (NEEDLE) ×1
NEEDLE SPNL 22GX7 QUINCKE BK (NEEDLE) ×2 IMPLANT
NS IRRIG 500ML POUR BTL (IV SOLUTION) ×2 IMPLANT
PAD PREP 24X48 CUFFED NSTRL (MISCELLANEOUS) IMPLANT
SYR CONTROL 10ML LL (SYRINGE) ×2 IMPLANT
UNDERPAD 30X30 INCONTINENT (UNDERPADS AND DIAPERS) ×2 IMPLANT

## 2017-08-29 NOTE — Anesthesia Postprocedure Evaluation (Signed)
Anesthesia Post Note  Patient: Erik Gill  Procedure(s) Performed: Procedure(s) (LRB): BIOPSY TRANSRECTAL ULTRASONIC PROSTATE (TUBP) (N/A)     Patient location during evaluation: PACU Anesthesia Type: MAC Level of consciousness: awake and alert Pain management: pain level controlled Vital Signs Assessment: post-procedure vital signs reviewed and stable Respiratory status: spontaneous breathing, nonlabored ventilation, respiratory function stable and patient connected to nasal cannula oxygen Cardiovascular status: stable and blood pressure returned to baseline Postop Assessment: no apparent nausea or vomiting Anesthetic complications: no    Last Vitals:  Vitals:   08/29/17 0830 08/29/17 0903  BP: (!) 159/99 (!) 172/97  Pulse: 71 78  Resp: 15 16  Temp:  36.5 C  SpO2: 94% 100%    Last Pain:  Vitals:   08/29/17 0903  TempSrc: Oral                 [name]

## 2017-08-29 NOTE — Discharge Instructions (Signed)
Transrectal Ultrasound-Guided Biopsy A transrectal ultrasound-guided biopsy is a procedure to remove samples of tissue from your prostate using ultrasound images to guide the procedure. The procedure is usually done to evaluate the prostate gland of men who have an elevated prostate-specific antigen (PSA). PSA is a blood test to screen for prostate cancer. The biopsy samples are taken to check for prostate cancer. Tell a health care provider about:  Any allergies you have.  All medicines you are taking, including vitamins, herbs, eye drops, creams, and over-the-counter medicines.  Any problems you or family members have had with anesthetic medicines.  Any blood disorders you have.  Any surgeries you have had.  Any medical conditions you have. What are the risks? Generally, this is a safe procedure. However, as with any procedure, problems can occur. Possible problems include:  Infection of your prostate.  Bleeding from your rectum or blood in your urine.  Difficulty urinating.  Nerve damage (this is usually temporary).  Damage to surrounding structures such as blood vessels, organs, and muscles, which would require other procedures.  What happens before the procedure?  Do not eat or drink anything after midnight on the night before the procedure or as directed by your health care provider.  Take medicines only as directed by your health care provider.  Your health care provider may have you stop taking certain medicines 5-7 days before the procedure.  You will be given an enema before the procedure. During an enema, a liquid is injected into your rectum to clear out waste.  You may have lab tests the day of your procedure.  Plan to have someone take you home after the procedure. What happens during the procedure?  You will be given medicine to help you relax (sedative) before the procedure. An IV tube will be inserted into one of your veins and used to give fluids and  medicine.  You will be given antibiotic medicine to reduce the risk of an infection.  You will be placed on your side for the procedure.  A probe with lubricated gel will be placed into your rectum, and images will be taken of your prostate and surrounding structures.  Numbing medicine will be injected into the prostate before the biopsy samples are taken.  A biopsy needle will then be inserted and guided to your prostate with the use of the ultrasound images.  Samples of prostate tissue will be taken, and the needle will then be removed.  The biopsy samples will be sent to a lab to be analyzed. Results are usually back in 2-3 days. What happens after the procedure?  You will be taken to a recovery area where you will be monitored.  You may have some discomfort in the rectal area. You will be given pain medicines to control this.  You may be allowed to go home the same day, or you may need to stay in the hospital overnight. This information is not intended to replace advice given to you by your health care provider. Make sure you discuss any questions you have with your health care provider. Document Released: 04/06/2014 Document Revised: 04/27/2016 Document Reviewed: 07/09/2013 Elsevier Interactive Patient Education  2018 [facility] Instructions  Activity: Get plenty of rest for the remainder of the day. A responsible individual must stay with you for 24 hours following the procedure.  For the next 24 hours, DO NOT: -Drive a car -Paediatric nurse -Drink alcoholic beverages -Take any medication unless instructed by  your physician -Make any legal decisions or sign important papers.  Meals: Start with liquid foods such as gelatin or soup. Progress to regular foods as tolerated. Avoid greasy, spicy, heavy foods. If nausea and/or vomiting occur, drink only clear liquids until the nausea and/or vomiting subsides. Call your physician if vomiting  continues.  Special Instructions/Symptoms: Your throat may feel dry or sore from the anesthesia or the breathing tube placed in your throat during surgery. If this causes discomfort, gargle with warm salt water. The discomfort should disappear within 24 hours.  If you had a scopolamine patch placed behind your ear for the management of post- operative nausea and/or vomiting:  1. The medication in the patch is effective for 72 hours, after which it should be removed.  Wrap patch in a tissue and discard in the trash. Wash hands thoroughly with soap and water. 2. You may remove the patch earlier than 72 hours if you experience unpleasant side effects which may include dry mouth, dizziness or visual disturbances. 3. Avoid touching the patch. Wash your hands with soap and water after contact with the patch.

## 2017-08-29 NOTE — Interval H&P Note (Signed)
History and Physical Interval Note:  08/29/2017 7:26 AM  Erik Gill  has presented today for surgery, with the diagnosis of ELEVATED PROSTATE SPECIFIC ANTIGEN  The various methods of treatment have been discussed with the patient and family. After consideration of risks, benefits and other options for treatment, the patient has consented to  Procedure(s): BIOPSY TRANSRECTAL ULTRASONIC PROSTATE (TUBP) (N/A) as a surgical intervention .  The patient's history has been reviewed, patient examined, no change in status, stable for surgery.  I have reviewed the patient's chart and labs.  Questions were answered to the patient's satisfaction.     [name] [name]

## 2017-08-29 NOTE — Op Note (Signed)
Operative Note  Preoperative diagnosis:  1. Elevated PSA  Postoperative diagnosis: 1. Elevated PSA  Procedure(s): 1. Transrectal ultrasound guided prostate biopsy  Surgeon: Ellison Hughs, MD  Assistants: None  Anesthesia: MAC  Complications: None  EBL: <5 mL  Specimens: 1. 12 prostate biopsies  Drains/Catheters: 1. None  Intraoperative findings: Prostate volume= 23.5 grams  Indication: Erik Gill is a 56 y.o. male with an elevated PSA value of 23.2 (06/2017).  He denies urinary urgency/freqeuncy, recurrent UTIs, dysuria or hematuria.  He has a strong family history of prostate cancer involving his father and his brother.  The patient was previously scheduled to undergo a prostate biopsy in 2015 when his PSA value was 10.6 (09/2014).  Description of procedure:  After informed consent was obtained, the patient was brought to the operating room and MAC/local anesthesia was administered. The patient was then placed in the right lateral decubitus position and prepped in the usual fashion. A timeout was performed. A rectal ultrasound probe was then carefully inserted into the rectum. A prostate nerve block was then administered with 2% lidocaine without epinephrine. Measurements of the prostate were then obtained revealing a volume of 23.5 g. Total 12 biopsies were then taken, 2 from each sextant and sent to pathology for analysis. The ultrasound probe was then removed. There did not appear to be any overt signs of rectal bleeding at the conclusion of the case. He tolerated the procedure well and was transferred to the postanesthesia in stable condition.  Plan:  Follow up on 09/13/2017 to discuss his biopsy results.

## 2017-08-29 NOTE — Transfer of Care (Signed)
  Last Vitals:  Vitals:   08/29/17 0600  BP: 138/90  Pulse: 91  Resp: 19  Temp: 37.2 C  SpO2: 100%    Last Pain:  Vitals:   08/29/17 0600  TempSrc: Oral      Patients Stated Pain Goal: 6 (08/29/17 3009)  Immediate Anesthesia Transfer of Care Note  Patient: Erik Gill  Procedure(s) Performed: Procedure(s) (LRB): BIOPSY TRANSRECTAL ULTRASONIC PROSTATE (TUBP) (N/A)  Patient Location: PACU  Anesthesia Type: MAC   Level of Consciousness: awake, alert  and oriented  Airway & Oxygen Therapy: Patient Spontanous Breathing and Patient connected to nasal cannula oxygen  Post-op Assessment: Report given to PACU RN and Post -op Vital signs reviewed and stable  Post vital signs: Reviewed and stable  Complications: No apparent anesthesia complications

## 2017-08-30 ENCOUNTER — Encounter (HOSPITAL_BASED_OUTPATIENT_CLINIC_OR_DEPARTMENT_OTHER): Payer: Self-pay | Admitting: Urology

## 2017-09-18 ENCOUNTER — Encounter: Payer: Self-pay | Admitting: Radiation Oncology

## 2017-09-26 ENCOUNTER — Other Ambulatory Visit (HOSPITAL_COMMUNITY): Payer: Self-pay | Admitting: Urology

## 2017-09-26 DIAGNOSIS — C61 Malignant neoplasm of prostate: Secondary | ICD-10-CM

## 2017-10-08 ENCOUNTER — Ambulatory Visit
Admission: RE | Admit: 2017-10-08 | Discharge: 2017-10-08 | Disposition: A | Discharge status: Home / Self Care | Payer: 59 | Source: Ambulatory Visit | Attending: Radiation Oncology | Admitting: Radiation Oncology

## 2017-10-08 ENCOUNTER — Encounter: Payer: Self-pay | Admitting: Radiation Oncology

## 2017-10-08 VITALS — BP 156/94 | HR 75 | Temp 98.4°F | Ht 68.0 in | Wt 198.6 lb

## 2017-10-08 DIAGNOSIS — Z9889 Other specified postprocedural states: Secondary | ICD-10-CM | POA: Insufficient documentation

## 2017-10-08 DIAGNOSIS — F17210 Nicotine dependence, cigarettes, uncomplicated: Secondary | ICD-10-CM | POA: Insufficient documentation

## 2017-10-08 DIAGNOSIS — R351 Nocturia: Secondary | ICD-10-CM | POA: Diagnosis not present

## 2017-10-08 DIAGNOSIS — I10 Essential (primary) hypertension: Secondary | ICD-10-CM | POA: Insufficient documentation

## 2017-10-08 DIAGNOSIS — Z79891 Long term (current) use of opiate analgesic: Secondary | ICD-10-CM | POA: Diagnosis not present

## 2017-10-08 DIAGNOSIS — Z7982 Long term (current) use of aspirin: Secondary | ICD-10-CM | POA: Insufficient documentation

## 2017-10-08 DIAGNOSIS — C61 Malignant neoplasm of prostate: Secondary | ICD-10-CM

## 2017-10-08 DIAGNOSIS — Z79899 Other long term (current) drug therapy: Secondary | ICD-10-CM | POA: Insufficient documentation

## 2017-10-08 DIAGNOSIS — Z8042 Family history of malignant neoplasm of prostate: Secondary | ICD-10-CM | POA: Insufficient documentation

## 2017-10-08 DIAGNOSIS — K219 Gastro-esophageal reflux disease without esophagitis: Secondary | ICD-10-CM | POA: Insufficient documentation

## 2017-10-08 DIAGNOSIS — Z809 Family history of malignant neoplasm, unspecified: Secondary | ICD-10-CM

## 2017-10-08 HISTORY — DX: Malignant neoplasm of prostate: C61

## 2017-10-08 NOTE — Progress Notes (Signed)
See progress note under physician encounter. 

## 2017-10-08 NOTE — Progress Notes (Addendum)
GU Location of Tumor / Histology: prostatic adenocarcinoma  If Prostate Cancer, Gleason Score is (4 + 3) and PSA is (23.1). Prostate volume: 23.2 as of 06/2017.   Erik Gill PSA was 9.62 in March 2015. He cancelled prostate u/s and biopsy 4 times. Dr. Azalia Bilis (PCP) referred the patient back to [facility], Dr. Gilford Rile, in September 2018 for further evaluation of elevated PSA.  Diagnosis 1. Prostate, needle biopsy(ies), right base lateral - BENIGN PROSTATIC TISSUE - NO CARCINOMA IDENTIFIED 2. Prostate, needle biopsy(ies), right base medial - BENIGN PROSTATIC TISSUE - NO CARCINOMA IDENTIFIED 3. Prostate, needle biopsy(ies), right mid lateral - BENIGN PROSTATIC TISSUE - NO CARCINOMA IDENTIFIED 4. Prostate, needle biopsy(ies), right mid medial - PROSTATIC ADENOCARCINOMA, GLEASON SCORE 3+4=7 (GRADE GROUP 2), INVOLVING 10% OF THE BIOPSY - SEE COMMENT 5. Prostate, needle biopsy(ies), right apex lateral - PROSTATIC ADENOCARCINOMA, GLEASON SCORE 4+3=7 (GRADE GROUP 3), INVOLVING 90% OF THE BIOPSY - SEE COMMENT 6. Prostate, needle biopsy(ies), right apex medial - BENIGN PROSTATIC TISSUE - NO CARCINOMA IDENTIFIED 7. Prostate, needle biopsy(ies), left base lateral - BENIGN PROSTATIC TISSUE - NO CARCINOMA IDENTIFIED 8. Prostate, needle biopsy(ies), left base medial - PROSTATIC ADENOCARCINOMA, GLEASON SCORE 3+3=6 (GRADE GROUP 1), INVOLVING 40% OF THE BIOPSY DISCONTINUOUSLY 9. Prostate, needle biopsy(ies), left mid lateral - PROSTATIC ADENOCARCINOMA, GLEASON SCORE 3+3=6 (GRADE GROUP 1), INVOLVING 10% OF THE BIOPSY DISCONTINUOUSLY 10. Prostate, needle biopsy(ies), left mid medial - PROSTATIC ADENOCARCINOMA, GLEASON SCORE 3+3=6 (GRADE GROUP 1), INVOLVING <5% OF THE BIOPSY 11. Prostate, needle biopsy(ies), left apex lateral 12. Prostate, needle biopsy(ies), left apex medial - PROSTATIC ADENOCARCINOMA, GLEASON SCORE 3+3=6 (GRADE GROUP 1), INVOLVING 10% OF THE  BIOPSY  Past/Anticipated interventions by urology, if any: prostate biopsy, ordered CT of abdomen (not done yet), bone scan scheduled for 10/23/2017  Past/Anticipated interventions by medical oncology, if any: no  Weight changes, if any: no  Bowel/Bladder complaints, if any: Some urgency/frequency throughout the day, nocturia x 3-4, ED. Denies urinary leakage or incontinence. Denies dysuria or hematuria.   Nausea/Vomiting, if any: no  Pain issues, if any: Denies.   SAFETY ISSUES:  Prior radiation? no  Pacemaker/ICD? no  Possible current pregnancy? no  Is the patient on methotrexate?no  Current Complaints / other details:  56 year old male. 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 Father and two brothers with hx of prostate cancer. Younger brother had a prostatectomy and so did his first cousin. Patient reports his older brother had radiation. Single. Smokes occasionally. Has 2 daughters and 2 sons. Productive cough with clear sputum.

## 2017-10-08 NOTE — Progress Notes (Signed)
Radiation Oncology         [phone] ________________________________  Initial Outpatient Consultation  Name: Erik Gill MRN: [id number]  Date: 10/08/2017  DOB: 03/24/1961  IF:[id number], Gwyndolyn Saxon, MD  Davis Gourd*   REFERRING PHYSICIAN: Davis Gourd*  DIAGNOSIS: 56 y.o. gentleman with high risk, Stage T1c adenocarcinoma of the prostate with Gleason Score of 4+3, and PSA of 23.1    ICD-10-CM   1. Malignant neoplasm of prostate ([place]) C61     HISTORY OF PRESENT ILLNESS: Erik Gill is a 56 y.o. male seen at the request of Dr. Lovena Neighbours for a diagnosis of prostate cancer. He was noted to have an elevated PSA of 23.1 in July of this year by his primary care physician, Dr. Shirline Frees. He was previously seen for elevated PSA in 2015 and canceled his prostate biopsy four times. His PSA was in the 9-10 range at that time. Accordingly, he was referred for evaluation in urology by Dr. Ellison Hughs on 08/10/2017, where a digital rectal examination was performed at that time revealing no prostate nodules.  The patient proceeded to transrectal ultrasound with 12 biopsies of the prostate on 08/29/2017.  The prostate volume measured 23.55 cc.  Out of 12 core biopsies, 6 were positive.  The maximum Gleason score was 4+3, and this was seen in right apex lateral. 3+4 was seen in right mid medial. 3+3 was seen in left base medial, left mid lateral, left mid medial, and left apex medial.  The patient reviewed the biopsy results with his urologist, and imaging was recommended. Bone scan is scheduled for 10/23/2017 with CT Abdomen/Pelvis pending. The patient has kindly been referred today for discussion of potential radiation treatment options. He is reluctant to have surgery at this time.  PREVIOUS RADIATION THERAPY: No  PAST MEDICAL HISTORY:  Past Medical History:  Diagnosis Date  . Elevated PSA   . GERD (gastroesophageal reflux disease)   . Headache    sinus related  .  Hypertension   . Nocturia   . Prostate cancer ([place])   . Wears glasses       PAST SURGICAL HISTORY: Past Surgical History:  Procedure Laterality Date  . HERNIA REPAIR Right     FAMILY HISTORY:  Family History  Problem Relation Age of Onset  . Cancer Father        prostate  . Cancer Brother        prostate  . Cancer Brother        prostate  . Cancer Cousin        prostate cancer/first cousin    SOCIAL HISTORY:  Social History   Socioeconomic History  . Marital status: Single    Spouse name: Not on file  . Number of children: Not on file  . Years of education: Not on file  . Highest education level: Not on file  Social Needs  . Financial resource strain: Not on file  . Food insecurity - worry: Not on file  . Food insecurity - inability: Not on file  . Transportation needs - medical: Not on file  . Transportation needs - non-medical: Not on file  Occupational History    Comment: works night shift  Tobacco Use  . Smoking status: Current Some Day Smoker    Packs/day: 0.50    Years: 1.00    Pack years: 0.50    Types: Cigarettes  . Smokeless tobacco: Never Used  . Tobacco comment: 0.5 PP2D  Substance and Sexual Activity  .  Alcohol use: Yes    Alcohol/week: 0.0 oz    Comment: OCCASIONAL  . Drug use: No  . Sexual activity: Yes  Other Topics Concern  . Not on file  Social History Narrative   Father and two brothers with hx of prostate cancer. Single. Has 2 daughters and 2 sons.  The patient is single and lives in [place]. He works night shift in a Stage [employer].   ALLERGIES: Patient has no known allergies.  MEDICATIONS:  Current Outpatient Medications  Medication Sig Dispense Refill  . amLODipine-benazepril (LOTREL) 10-40 MG capsule Take 1 capsule by mouth daily.    Marland Kitchen aspirin EC 81 MG tablet Take 81 mg by mouth daily.    Marland Kitchen CIALIS 20 MG tablet Take 1 tablet by mouth daily as needed.    . fexofenadine (ALLEGRA) 60 MG tablet Take 60 mg by mouth 2  (two) times daily as needed.     . fluticasone (FLONASE) 50 MCG/ACT nasal spray USE 2 SPRAYS INTO BOTH NOSTRILS DAILY 16 g 0  . HYDROcodone-acetaminophen (NORCO/VICODIN) 5-325 MG tablet Take 1 tablet by mouth every 6 (six) hours as needed for moderate pain. 3 tablet 0   No current facility-administered medications for this encounter.     REVIEW OF SYSTEMS:  On review of systems, the patient reports that he is doing well overall but is feeling very anxious about his diagnosis and treatment. He denies any chest pain, shortness of breath, fevers, chills, night sweats, or unintended weight changes. He reports a productive cough with clear sputum that he relates to intermittent sinus issues for which he takes Allegra and herbal remedies. He denies any bowel disturbances, and denies abdominal pain, nausea or vomiting. He denies any new musculoskeletal or joint aches or pains. His IPSS was 15, indicating moderate urinary symptoms, including some urgency and frequency throughout the day as well as nocturia x 3-4. He denies any dysuria, hematuria, leakage or incontinence. He is able to complete sexual activity with most attempts with Cialis. A complete review of systems is obtained and is otherwise negative.    PHYSICAL EXAM:  Wt Readings from Last 3 Encounters:  10/08/17 198 lb 9.6 oz (90.1 kg)  08/29/17 199 lb (90.3 kg)  11/28/16 208 lb (94.3 kg)   Temp Readings from Last 3 Encounters:  10/08/17 98.4 F (36.9 C)  08/29/17 97.7 F (36.5 C) (Oral)  11/28/16 97.3 F (36.3 C) (Oral)   BP Readings from Last 3 Encounters:  10/08/17 (!) 156/94  08/29/17 (!) 172/97  11/28/16 (!) 150/84   Pulse Readings from Last 3 Encounters:  10/08/17 75  08/29/17 78  11/28/16 86      In general this is a well appearing African-American man in no acute distress. He is alert and oriented x4 and appropriate throughout the examination. HEENT reveals that the patient is normocephalic, atraumatic. Skin is intact  without any evidence of gross lesions. Cardiovascular exam reveals a regular rate and rhythm, no clicks rubs or murmurs are auscultated. Chest is clear to auscultation bilaterally. Lymphatic assessment is performed and does not reveal any adenopathy in the cervical, supraclavicular, axillary, or inguinal chains. Abdomen has active bowel sounds in all quadrants and is intact. The abdomen is soft, non tender, non distended. No edema is note of the lower extremities.   KPS = 100  100 - Normal; no complaints; no evidence of disease. 90   - Able to carry on normal activity; minor signs or symptoms of disease. 80   -  Normal activity with effort; some signs or symptoms of disease. 78   - Cares for self; unable to carry on normal activity or to do active work. 60   - Requires occasional assistance, but is able to care for most of his personal needs. 50   - Requires considerable assistance and frequent medical care. 32   - Disabled; requires special care and assistance. 63   - Severely disabled; hospital admission is indicated although death not imminent. 34   - Very sick; hospital admission necessary; active supportive treatment necessary. 10   - Moribund; fatal processes progressing rapidly. 0     - Dead  Karnofsky DA, Abelmann [place], Craver LS and Burchenal [facility] [phone]) The use of the nitrogen mustards in the palliative treatment of carcinoma: with particular reference to bronchogenic carcinoma Cancer 1 634-56  LABORATORY DATA:  Lab Results  Component Value Date   HGB 14.3 08/29/2017   HCT 42.0 08/29/2017   Lab Results  Component Value Date   NA 140 08/29/2017   K 4.0 08/29/2017   No results found for: ALT, AST, GGT, ALKPHOS, BILITOT   RADIOGRAPHY: No results found.    IMPRESSION/PLAN: 1. 56 y.o. gentleman with high risk, Stage T1c adenocarcinoma of the prostate with Gleason Score of 4+3, and PSA of 23.1. Dr. Tammi Klippel discussed the patient's workup and outlines the nature of prostate cancer in  this setting. The patient's PSA puts him into the high risk group. Accordingly, he is eligible for  2 years of ADT with 8 weeks of external radiation or 2 years of ADT with brachytherapy boost followed by 5 weeks of external radiation. We discussed the available radiation techniques, and focused on the details and logistics and delivery. We also discussed the placement of SpaceOAR gel at the time of either fiducial marker placement alone in the OR, versus with seed implant and fiducial marker placement. We discussed the role of waiting to start external radiotherapy 2 months after ADT injections if he chose 8 weeks of treatment. We discussed and outlined the risks, benefits, short and long-term effects associated with radiotherapy and compared and contrasted these with prostatectomy. He is not ready to commit to treatment. We will follow up with the results of his scans that are due on 10/23/17, and with Dr. Lovena Neighbours regarding his decisions moving forward. 2. Possible genetic predisposition to malignancy. The patient is offered genetic counseling referral. He's not ready to commit to this at this time either.    Carola Rhine, [facility]   Page Me  Seen with  _____________________________________  Sheral Apley Tammi Klippel, M.D.  This document serves as a record of services personally performed by Tyler Pita, MD and Shona Simpson, PA-C. It was created on their behalf by Rae Lips, a trained medical scribe. The creation of this record is based on the scribe's personal observations and the providers' statements to them. This document has been checked and approved by the attending providers.

## 2017-10-18 ENCOUNTER — Telehealth: Payer: Self-pay | Admitting: Medical Oncology

## 2017-10-18 NOTE — Telephone Encounter (Signed)
I called [name] to introduce myself as the prostate nurse navigator and my role. I was unable to meet him the day he consulted with Dr. Tammi Klippel. He is scheduled for a bone scan 10/23/17 and he confirmed he is aware of this appointment.We discussed the importance of treatment with his PSA and Gleason. He voiced understanding. I will continue to follow. I asked him to call me with questions or concerns.

## 2017-10-23 ENCOUNTER — Encounter (HOSPITAL_COMMUNITY)
Admission: RE | Admit: 2017-10-23 | Discharge: 2017-10-23 | Disposition: A | Discharge status: Home / Self Care | Payer: 59 | Source: Ambulatory Visit | Attending: Urology | Admitting: Urology

## 2017-10-23 DIAGNOSIS — C61 Malignant neoplasm of prostate: Secondary | ICD-10-CM

## 2017-10-23 MED ORDER — TECHNETIUM TC 99M MEDRONATE IV KIT
21.7000 | PACK | Freq: Once | INTRAVENOUS | Status: AC | PRN
  Administered 2017-10-23: 21.7 via INTRAVENOUS

## 2017-11-01 ENCOUNTER — Telehealth: Payer: Self-pay | Admitting: Radiation Oncology

## 2017-11-01 NOTE — Telephone Encounter (Addendum)
I called the patient to review his scans and to find out if he is ready to proceed with 8 wks plus ADT versus ADT plus 5 weeks and boost. I had to leave a message asking him to return my call.

## 2017-11-08 ENCOUNTER — Telehealth: Payer: Self-pay | Admitting: Radiation Oncology

## 2017-11-08 NOTE — Telephone Encounter (Signed)
-----   Message from Hayden Pedro, PA-C sent at 10/08/2017  1:43 PM EST ----- Regarding: 10/23/17 Follow up to see results of scans. 8 wks plus ADT versus ADT plus 5 weeks and boost.

## 2017-11-08 NOTE — Telephone Encounter (Signed)
I called the patient and left a message asking him to call me back at his convenience.

## 2017-11-14 ENCOUNTER — Telehealth: Payer: Self-pay | Admitting: Radiation Oncology

## 2017-11-14 NOTE — Telephone Encounter (Addendum)
I called to follow up on results of scans, but could not reach the patient or his mother. His options include 8 wks XRT plus ADT versus ADT plus 5 weeks XRT and seed boost. I left another message asking him to return my call.

## 2017-11-29 ENCOUNTER — Telehealth: Payer: Self-pay | Admitting: Radiation Oncology

## 2017-11-29 NOTE — Telephone Encounter (Signed)
LM for pt to return my call regarding decision making for treatment.

## 2017-12-11 ENCOUNTER — Telehealth: Payer: Self-pay | Admitting: Medical Oncology

## 2017-12-11 NOTE — Telephone Encounter (Signed)
Left a voice mail requesting a return call to discuss his  treatment decision. I stressed the importance of making a decision and not delaying.

## 2017-12-26 ENCOUNTER — Telehealth: Payer: Self-pay | Admitting: Radiation Oncology

## 2017-12-26 NOTE — Telephone Encounter (Signed)
LM again for patient to check to see if he has made a decision regarding treatment for his prostate cancer.

## 2018-02-22 ENCOUNTER — Telehealth: Payer: Self-pay | Admitting: Medical Oncology

## 2018-02-22 NOTE — Telephone Encounter (Signed)
Left message requesting a return call to discuss treatment for his prostate cancer. I stressed the importance of receiving treatment sooner than later.

## 2018-03-07 ENCOUNTER — Telehealth: Payer: Self-pay | Admitting: Medical Oncology

## 2018-03-07 NOTE — Telephone Encounter (Signed)
Left a message requesting a return call to discuss treatment for his prostate cancer. I stressed the importance of getting treatment and not to continue to delay.

## 2018-04-25 ENCOUNTER — Telehealth: Payer: Self-pay | Admitting: Radiation Oncology

## 2018-04-25 NOTE — Telephone Encounter (Signed)
Yes please

## 2018-04-25 NOTE — Telephone Encounter (Signed)
-----   Message from Hayden Pedro, [place] sent at 04/25/2018  4:29 PM EDT ----- Do you mind trying to help me reach out to him too? He won't return my calls ----- Message ----- From: Hayden Pedro, PA-C Sent: 11/29/2017   9:02 AM To: Hayden Pedro, PA-C Subject: 12/13/17                                         ----- Message ----- From: Hayden Pedro, PA-C Sent: 11/14/2017   3:52 PM To: Hayden Pedro, PA-C Subject: 11/26/17                                       Meeting with borden to discuss surgery on 11/20/17 ----- Message ----- From: Hayden Pedro, PA-C Sent: 10/08/2017   1:43 PM To: Hayden Pedro, PA-C Subject: 10/23/17                                       Follow up to see results of scans. 8 wks plus ADT versus ADT plus 5 weeks and boost.

## 2018-04-25 NOTE — Telephone Encounter (Signed)
Patient works night shift. Waited as late in the day as possible to phone his home. No answer. Left detailed message with my direct line. Explained if he did not reach me to please leave a detailed message about his decision. Expressed our concern. Awaiting return call.

## 2018-05-01 ENCOUNTER — Encounter: Payer: Self-pay | Admitting: Radiation Oncology

## 2018-06-11 ENCOUNTER — Telehealth: Payer: Self-pay | Admitting: Medical Oncology

## 2018-06-11 NOTE — Telephone Encounter (Signed)
Left message requesting patient return my call to discuss treatment for his prostate cancer.  I stressed the importance of treatment and I will help him get an appointment for radiation or surgery.

## 2019-01-18 ENCOUNTER — Encounter: Payer: Self-pay | Admitting: Osteopathic Medicine

## 2019-01-18 ENCOUNTER — Ambulatory Visit: Payer: Managed Care, Other (non HMO) | Admitting: Osteopathic Medicine

## 2019-01-18 ENCOUNTER — Other Ambulatory Visit: Payer: Self-pay

## 2019-01-18 VITALS — BP 137/80 | HR 77 | Temp 98.5°F | Resp 20 | Ht 66.54 in | Wt 202.8 lb

## 2019-01-18 DIAGNOSIS — L501 Idiopathic urticaria: Secondary | ICD-10-CM

## 2019-01-18 MED ORDER — METHYLPREDNISOLONE SODIUM SUCC 125 MG IJ SOLR
125.0000 mg | Freq: Once | INTRAMUSCULAR | Status: AC
  Administered 2019-01-18: 125 mg via INTRAMUSCULAR

## 2019-01-18 MED ORDER — HYDROXYZINE HCL 50 MG PO TABS: 25.0000 mg | ORAL_TABLET | Freq: Three times a day (TID) | ORAL | 0 refills | Status: DC | PRN

## 2019-01-18 MED ORDER — TRIAMCINOLONE ACETONIDE 0.1 % EX CREA: 1.0000 "application " | TOPICAL_CREAM | Freq: Two times a day (BID) | CUTANEOUS | 0 refills | Status: DC

## 2019-01-18 NOTE — Progress Notes (Signed)
HPI: Erik Gill is a 58 y.o. male who  has a past medical history of Elevated PSA, GERD (gastroesophageal reflux disease), Headache, Hypertension, Nocturia, Prostate cancer (Erik Gill), and Wears glasses.  he presents to [facility] today, 01/18/19,  for chief complaint of:  Skin problem  . Context: no known exposures to new foods/meds, no new lotions, soaps, detergents, etc  . Location: arm, hand, abdomen, chest  . Quality: itching . Duration: 3-4 days . Modifying factors: old tube triamcinolone ointment was helping a bit      Past medical history, surgical history, and family history reviewed.  Current medication list and allergy/intolerance information reviewed.   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     Meds ordered this encounter  Medications  . methylPREDNISolone sodium succinate (SOLU-MEDROL) 125 mg/2 mL injection 125 mg  . triamcinolone cream (KENALOG) 0.1 %    Sig: Apply 1 application topically 2 (two) times daily. To affected area(s) as needed    Dispense:  30 g    Refill:  0  . hydrOXYzine (ATARAX/VISTARIL) 50 MG tablet    Sig: Take 0.5-1 tablets (25-50 mg total) by mouth 3 (three) times daily as needed for itching. For itching.    Dispense:  30 tablet    Refill:  0    Patient Instructions   Can take prescribed medications as directed: Triamcinolone cream as needed to itching spots of skin Hydroxyzine as needed for itching / sleep   Can also add Zyrtec if itching is mild.   Can use lotions: anything for Eczema as these are non-scented and will moisturize the best - Aveeno, CeraVe, Cetaphil.   If not getting better, would consider biopsy of rash +/- referral to dermatologist. Please let us know if you're not getting better!       If you have lab work done today you will be contacted with your lab results within the next 2 weeks.  If you have not  heard from [place] then please contact us. The fastest way to get your results is to register for My Chart.   IF you received an x-ray today, you will receive an invoice from [facility]. Please contact [facility] at [phone] with questions or concerns regarding your invoice.   IF you received labwork today, you will receive an invoice from [facility]. Please contact [facility] at [phone] with questions or concerns regarding your invoice.   Our billing staff will not be able to assist you with questions regarding bills from these companies.  You will be contacted with the lab results as soon as they are available. The fastest way to get your results is to activate your My Chart account. Instructions are located on the last page of this paperwork. If you have not heard from [place] regarding the results in 2 weeks, please contact this office.       Follow-up plan: Return if symptoms worsen or fail to improve.                                            ############################################ ############################################ ############################################ ############################################    Outpatient Encounter Medications as of 01/18/2019  Medication Sig Note  . amLODipine-benazepril (LOTREL) 10-40 MG capsule Take 1 capsule by mouth daily. 10/23/2016: Received from: External Pharmacy  .  aspirin EC 81 MG tablet Take 81 mg by mouth daily.   Marland Kitchen CIALIS 20 MG tablet Take 1 tablet by mouth daily as needed. 10/23/2016: Received from: External Pharmacy  . fexofenadine (ALLEGRA) 60 MG tablet Take 60 mg by mouth 2 (two) times daily as needed.    . fluticasone (FLONASE) 50 MCG/ACT nasal spray USE 2 SPRAYS INTO BOTH NOSTRILS DAILY   . HYDROcodone-acetaminophen (NORCO/VICODIN) 5-325 MG tablet Take 1 tablet by mouth every 6 (six) hours as needed for moderate pain.   . [DISCONTINUED] triamcinolone ointment  (KENALOG) 0.5 % Apply 1 application topically 2 (two) times daily.   . hydrOXYzine (ATARAX/VISTARIL) 50 MG tablet Take 0.5-1 tablets (25-50 mg total) by mouth 3 (three) times daily as needed for itching. For itching.   . triamcinolone cream (KENALOG) 0.1 % Apply 1 application topically 2 (two) times daily. To affected area(s) as needed   . [EXPIRED] methylPREDNISolone sodium succinate (SOLU-MEDROL) 125 mg/2 mL injection 125 mg     No facility-administered encounter medications on file as of 01/18/2019.    No Known Allergies    Review of Systems:  Constitutional: No recent illness  Cardiac: No  chest pain, No  pressure, No palpitations  Respiratory:  No  shortness of breath. No  Cough  Musculoskeletal: No new myalgia/arthralgia  Skin: +Rash  Neurologic: No  weakness, No  Dizziness   Exam:  BP 137/80   Pulse 77   Temp 98.5 F (36.9 C) (Oral)   Resp 20   Ht 5' 6.54" (1.69 m)   Wt 202 lb 12.8 oz (92 kg)   SpO2 98%   BMI 32.21 kg/m   Constitutional: VS see above. General Appearance: alert, well-developed, well-nourished, NAD  Eyes: Normal lids and conjunctive, non-icteric sclera  Ears, Nose, Mouth, Throat: MMM, Normal external inspection ears/nares/mouth/lips/gums.  Neck: No masses, trachea midline.   Respiratory: Normal respiratory effort.  Musculoskeletal: Gait normal. Symmetric and independent movement of all extremities  Neurological: Normal balance/coordination. No tremor.  Skin: warm, dry, intact. Some excoriations on hands d/t scratching. Very faint maculopapular rash on upper arms, chest.   Psychiatric: Normal judgment/insight. Normal mood and affect. Oriented x3.   Visit summary with medication list and pertinent instructions was printed for patient to review, advised to alert [place] if any changes needed. All questions at time of visit were answered - patient instructed to contact office with any additional concerns. ER/RTC precautions were reviewed with the  patient and understanding verbalized.   Follow-up plan: Return if symptoms worsen or fail to improve.     Please note: voice recognition software was used to produce this document, and typos may escape review. Please contact Dr. Sheppard Coil for any needed clarifications.

## 2019-01-18 NOTE — Patient Instructions (Addendum)
Can take prescribed medications as directed: Triamcinolone cream as needed to itching spots of skin Hydroxyzine as needed for itching / sleep   Can also add Zyrtec if itching is mild.   Can use lotions: anything for Eczema as these are non-scented and will moisturize the best - Aveeno, CeraVe, Cetaphil.   If not getting better, would consider biopsy of rash +/- referral to dermatologist. Please let us know if you're not getting better!       If you have lab work done today you will be contacted with your lab results within the next 2 weeks.  If you have not heard from [place] then please contact us. The fastest way to get your results is to register for My Chart.   IF you received an x-ray today, you will receive an invoice from [facility]. Please contact [facility] at [phone] with questions or concerns regarding your invoice.   IF you received labwork today, you will receive an invoice from [facility]. Please contact [facility] at [phone] with questions or concerns regarding your invoice.   Our billing staff will not be able to assist you with questions regarding bills from these companies.  You will be contacted with the lab results as soon as they are available. The fastest way to get your results is to activate your My Chart account. Instructions are located on the last page of this paperwork. If you have not heard from [place] regarding the results in 2 weeks, please contact this office.

## 2019-10-03 ENCOUNTER — Other Ambulatory Visit (HOSPITAL_COMMUNITY): Payer: Self-pay | Admitting: Urology

## 2019-10-03 ENCOUNTER — Other Ambulatory Visit: Payer: Self-pay | Admitting: Urology

## 2019-10-03 DIAGNOSIS — C61 Malignant neoplasm of prostate: Secondary | ICD-10-CM

## 2019-10-15 ENCOUNTER — Ambulatory Visit (HOSPITAL_COMMUNITY)
Admission: RE | Admit: 2019-10-15 | Discharge: 2019-10-15 | Disposition: A | Discharge status: Home / Self Care | Payer: Managed Care, Other (non HMO) | Source: Ambulatory Visit | Attending: Urology | Admitting: Urology

## 2019-10-15 ENCOUNTER — Other Ambulatory Visit: Payer: Self-pay

## 2019-10-15 DIAGNOSIS — C61 Malignant neoplasm of prostate: Secondary | ICD-10-CM | POA: Diagnosis not present

## 2019-10-15 MED ORDER — TECHNETIUM TC 99M MEDRONATE IV KIT
21.2000 | PACK | Freq: Once | INTRAVENOUS | Status: AC | PRN
  Administered 2019-10-15: 21.2 via INTRAVENOUS

## 2021-02-14 ENCOUNTER — Ambulatory Visit (HOSPITAL_COMMUNITY)
Admission: EM | Admit: 2021-02-14 | Discharge: 2021-02-14 | Disposition: A | Discharge status: Home / Self Care | Payer: Managed Care, Other (non HMO) | Attending: Urgent Care | Admitting: Urgent Care

## 2021-02-14 ENCOUNTER — Other Ambulatory Visit: Payer: Self-pay

## 2021-02-14 ENCOUNTER — Ambulatory Visit (INDEPENDENT_AMBULATORY_CARE_PROVIDER_SITE_OTHER): Payer: Managed Care, Other (non HMO)

## 2021-02-14 ENCOUNTER — Encounter (HOSPITAL_COMMUNITY): Payer: Self-pay | Admitting: Emergency Medicine

## 2021-02-14 DIAGNOSIS — F17200 Nicotine dependence, unspecified, uncomplicated: Secondary | ICD-10-CM

## 2021-02-14 DIAGNOSIS — R059 Cough, unspecified: Secondary | ICD-10-CM

## 2021-02-14 DIAGNOSIS — R0989 Other specified symptoms and signs involving the circulatory and respiratory systems: Secondary | ICD-10-CM

## 2021-02-14 DIAGNOSIS — J3089 Other allergic rhinitis: Secondary | ICD-10-CM

## 2021-02-14 DIAGNOSIS — R0981 Nasal congestion: Secondary | ICD-10-CM

## 2021-02-14 MED ORDER — MONTELUKAST SODIUM 10 MG PO TABS: 10.0000 mg | ORAL_TABLET | Freq: Every day | ORAL | 0 refills | Status: DC

## 2021-02-14 MED ORDER — PREDNISONE 10 MG PO TABS: 30.0000 mg | ORAL_TABLET | Freq: Every day | ORAL | 0 refills | Status: DC

## 2021-02-14 NOTE — ED Provider Notes (Signed)
[place]   MRN: [id number] DOB: 01-25-1961  Subjective:   Erik Gill is a 60 y.o. male presenting for 2 to 3-day history of acute onset recurrent sinus congestion, postnasal drainage, cough, head and chest congestion.  Denies chest pain, shortness of breath.  Patient just came back from [place], was there for about 10 days.  States that he had a hard time with his allergies out there.  Takes Zyrtec every day, has also used Mucinex.  He is COVID vaccinated, has had his booster.  States that he did and at home test and was negative, does not want a test here in the clinic.  He is also a smoker, smokes 1/2-1ppd.  No current facility-administered medications for this encounter.  Current Outpatient Medications:  .  amLODipine-benazepril (LOTREL) 10-40 MG capsule, Take 1 capsule by mouth daily., Disp: , Rfl:  .  aspirin EC 81 MG tablet, Take 81 mg by mouth daily., Disp: , Rfl:  .  CIALIS 20 MG tablet, Take 1 tablet by mouth daily as needed., Disp: , Rfl:  .  fexofenadine (ALLEGRA) 60 MG tablet, Take 60 mg by mouth 2 (two) times daily as needed. , Disp: , Rfl:  .  fluticasone (FLONASE) 50 MCG/ACT nasal spray, USE 2 SPRAYS INTO BOTH NOSTRILS DAILY, Disp: 16 g, Rfl: 0 .  hydrOXYzine (ATARAX/VISTARIL) 50 MG tablet, Take 0.5-1 tablets (25-50 mg total) by mouth 3 (three) times daily as needed for itching. For itching., Disp: 30 tablet, Rfl: 0 .  triamcinolone cream (KENALOG) 0.1 %, Apply 1 application topically 2 (two) times daily. To affected area(s) as needed, Disp: 30 g, Rfl: 0   No Known Allergies  Past Medical History:  Diagnosis Date  . Elevated PSA   . GERD (gastroesophageal reflux disease)   . Headache    sinus related  . Hypertension   . Nocturia   . Prostate cancer ([place])   . Wears glasses      Past Surgical History:  Procedure Laterality Date  . HERNIA REPAIR Right   . PROSTATE BIOPSY N/A 08/29/2017   Procedure: BIOPSY TRANSRECTAL ULTRASONIC PROSTATE  (TUBP);  Surgeon: Ceasar Mons, MD;  Location: [facility];  Service: Urology;  Laterality: N/A;    Family History  Problem Relation Age of Onset  . Cancer Father        prostate  . Cancer Brother        prostate  . Cancer Brother        prostate  . Cancer Cousin        prostate cancer/first cousin    Social History   Tobacco Use  . Smoking status: Current Some Day Smoker    Packs/day: 0.50    Years: 1.00    Pack years: 0.50    Types: Cigarettes  . Smokeless tobacco: Never Used  . Tobacco comment: 0.5 PP2D  Vaping Use  . Vaping Use: Never used  Substance Use Topics  . Alcohol use: Yes    Alcohol/week: 0.0 standard drinks    Comment: OCCASIONAL  . Drug use: No    ROS   Objective:   Vitals: BP (!) 184/116 (BP Location: Right Arm)   Pulse 69   Temp 98 F (36.7 C) (Oral)   Resp 18   SpO2 97%   BP Readings from Last 3 Encounters:  02/14/21 (!) 184/116  01/18/19 137/80  10/08/17 (!) 156/94   BP 154/102 on recheck by PA-Pier Laux.   Physical Exam  Constitutional:      General: He is not in acute distress.    Appearance: Normal appearance. He is well-developed. He is not ill-appearing, toxic-appearing or diaphoretic.  HENT:     Head: Normocephalic and atraumatic.     Right Ear: External ear normal.     Left Ear: External ear normal.     Nose: Congestion and rhinorrhea present.     Mouth/Throat:     Mouth: Mucous membranes are moist.     Pharynx: No oropharyngeal exudate or posterior oropharyngeal erythema.  Eyes:     General: No scleral icterus.       Right eye: No discharge.        Left eye: No discharge.     Extraocular Movements: Extraocular movements intact.     Conjunctiva/sclera: Conjunctivae normal.     Pupils: Pupils are equal, round, and reactive to light.  Cardiovascular:     Rate and Rhythm: Normal rate and regular rhythm.     Heart sounds: Normal heart sounds. No murmur heard. No friction rub. No gallop.    Pulmonary:     Effort: Pulmonary effort is normal. No respiratory distress.     Breath sounds: Normal breath sounds. No stridor. No wheezing, rhonchi or rales.  Neurological:     Mental Status: He is alert and oriented to person, place, and time.     Motor: No weakness.     Coordination: Coordination normal.     Gait: Gait normal.     Deep Tendon Reflexes: Reflexes normal.  Psychiatric:        Mood and Affect: Mood normal.        Behavior: Behavior normal.        Thought Content: Thought content normal.        Judgment: Judgment normal.     DG Chest 2 View  Result Date: 02/14/2021 CLINICAL DATA:  Cough, congestion EXAM: CHEST - 2 VIEW COMPARISON:  01/19/2008 chest radiograph. FINDINGS: In Stable cardiomediastinal silhouette with normal heart size. No pneumothorax. No pleural effusion. Lungs appear clear, with no acute consolidative airspace disease and no pulmonary edema. IMPRESSION: No active cardiopulmonary disease. Electronically Signed   By: Ilona Sorrel M.D.   On: 02/14/2021 11:46    Assessment and Plan :   PDMP not reviewed this encounter.  1. Allergic rhinitis due to other allergic trigger, unspecified seasonality   2. Cough   3. Chest congestion   4. Smoker     Recommended oral prednisone course to help with his acute on chronic sinusitis, rhinitis.  Emphasized need to quit smoking.  Maintain Zyrtec, add Singulair.  Patient refused COVID-19 testing. Counseled patient on potential for adverse effects with medications prescribed/recommended today, ER and return-to-clinic precautions discussed, patient verbalized understanding.    Jaynee Eagles, PA-C 02/14/21 1236

## 2021-02-14 NOTE — ED Triage Notes (Signed)
Pt presents with cough, drainage and head congestion xs 2 days. States recently traveled to [place] for [employer] and symptoms started after returning.   States has used Mucinex DM and Tylenol sinus with some relief.

## 2021-09-29 ENCOUNTER — Other Ambulatory Visit (HOSPITAL_COMMUNITY): Payer: Self-pay | Admitting: Urology

## 2021-09-29 DIAGNOSIS — C61 Malignant neoplasm of prostate: Secondary | ICD-10-CM

## 2021-10-06 ENCOUNTER — Encounter (HOSPITAL_COMMUNITY)
Admission: RE | Admit: 2021-10-06 | Discharge: 2021-10-06 | Disposition: A | Discharge status: Home / Self Care | Payer: Managed Care, Other (non HMO) | Source: Ambulatory Visit | Attending: Urology | Admitting: Urology

## 2021-10-06 ENCOUNTER — Inpatient Hospital Stay (HOSPITAL_COMMUNITY): Admission: RE | Admit: 2021-10-06 | Payer: Managed Care, Other (non HMO) | Source: Ambulatory Visit

## 2021-10-06 ENCOUNTER — Other Ambulatory Visit: Payer: Self-pay

## 2021-10-06 DIAGNOSIS — C61 Malignant neoplasm of prostate: Secondary | ICD-10-CM | POA: Diagnosis present

## 2021-10-06 MED ORDER — PIFLIFOLASTAT F 18 (PYLARIFY) INJECTION
9.0000 | Freq: Once | INTRAVENOUS | Status: AC
  Administered 2021-10-06: 9.53 via INTRAVENOUS

## 2021-10-13 ENCOUNTER — Other Ambulatory Visit: Payer: Self-pay | Admitting: Urology

## 2021-10-13 DIAGNOSIS — C61 Malignant neoplasm of prostate: Secondary | ICD-10-CM

## 2021-11-01 NOTE — Progress Notes (Addendum)
PCP - Dr. Shirline Frees  [facility] Cardiologist - no  PPM/ICD -  Device Orders -  Rep Notified -   Chest x-ray - 02-14-21 epic EKG -  Stress Test -  ECHO -  Cardiac Cath -   Sleep Study -  CPAP -   Fasting Blood Sugar -  Checks Blood Sugar _____ times a day  Blood Thinner Instructions:81mg  asa stop 5 days Aspirin Instructions:  ERAS Protcol - PRE-SURGERY Ensure or G2-   COVID TEST- 11-10-21 COVID vaccine -  Activity--Able to walk a flight of stairs without SOB Anesthesia review: HTN  Patient denies shortness of breath, fever, cough and chest pain at PAT appointment   All instructions explained to the patient, with a verbal understanding of the material. Patient agrees to go over the instructions while at home for a better understanding. Patient also instructed to self quarantine after being tested for COVID-19. The opportunity to ask questions was provided.

## 2021-11-01 NOTE — Patient Instructions (Signed)
DUE TO COVID-19 ONLY ONE VISITOR IS ALLOWED TO COME WITH YOU AND STAY IN THE WAITING ROOM ONLY DURING PRE OP AND PROCEDURE DAY OF SURGERY.   Up to two visitors ages 16+ are allowed at one time in a patient's room.  The visitors may rotate out with other people throughout the day.  Additionally, up to two children between the ages of 69 and 59 are allowed and do not count toward the number of allowed visitors.  Children within this age range must be accompanied by an adult visitor.  One adult visitor may remain with the patient overnight and must be in the room by 8 PM.  YOU NEED TO HAVE A COVID 19 TEST ON___12-8-22____ between 8am-3pm______, THIS TEST MUST BE DONE BEFORE SURGERY,     Please bring completed form with you to the COVID testing site   COVID TESTING SITE [place] TEST IS COMPLETED,  PLEASE Wear a mask when in public           Your procedure is scheduled on: 11-14-21   Report to [facility] Main  Entrance   Report to admitting at      [place]  AM     Call this number if you have problems the morning of surgery [phone]   Remember: Follow a clear liquid diet the day before surgery . Follow bowel prep per MD orders. Miralax 17 g in 4 oz of water by noon day before surgery. 1 fleets enema the night before surgery    CLEAR LIQUID DIET                                                                    water Black Coffee and tea, regular and decaf No Creamer                            Plain Jell-O any favor except red or purple                                  Fruit ices (not with fruit pulp)                                      Iced Popsicles                                     Carbonated beverages, regular and diet                                    Cranberry, grape and apple juices Sports drinks like Gatorade Lightly seasoned clear broth or consume(fat free) Sugar, honey syrup  Sample  Menu Breakfast  Lunch                                     Supper Cranberry juice                    Beef broth                            Chicken broth Jell-O                                     Grape juice                           Apple juice Coffee or tea                        Jell-O                                      Popsicle                                                Coffee or tea                        Coffee or tea  _____________________________________________________________________     BRUSH YOUR TEETH MORNING OF SURGERY AND RINSE YOUR MOUTH OUT, NO CHEWING GUM CANDY OR MINTS.     Take these medicines the morning of surgery with A SIP OF WATER: metoprolol                                 You may not have any metal on your body including hair pins and              piercings  Do not wear jewelry, lotions, powders,perfumes,        deodorant                     Men may shave face and neck.   Do not bring valuables to the hospital. [place].  Contacts, dentures or bridgework may not be worn into surgery.  You may bring a small overnight bag with you     Patients discharged the day of surgery will not be allowed to drive home. IF YOU ARE HAVING SURGERY AND GOING HOME THE SAME DAY, YOU MUST HAVE AN ADULT TO DRIVE YOU HOME AND BE WITH YOU FOR 24 HOURS. YOU MAY GO HOME BY TAXI OR UBER OR ORTHERWISE, BUT AN ADULT MUST ACCOMPANY YOU HOME AND STAY WITH YOU FOR 24 HOURS.  Name and phone number of your driver:  Special Instructions: N/A              Please read over the following fact sheets you were given: _____________________________________________________________________  [place] - Preparing for Surgery Before surgery, you can play an important role.  Because skin is not sterile, your skin needs to be as free of germs as possible.  You can reduce the number of germs on your skin  by washing with CHG (chlorahexidine gluconate) soap before surgery.  CHG is an antiseptic cleaner which kills germs and bonds with the skin to continue killing germs even after washing. Please DO NOT use if you have an allergy to CHG or antibacterial soaps.  If your skin becomes reddened/irritated stop using the CHG and inform your nurse when you arrive at Short Stay. Do not shave (including legs and underarms) for at least 48 hours prior to the first CHG shower.  You may shave your face/neck. Please follow these instructions carefully:  1.  Shower with CHG Soap the night before surgery and the  morning of Surgery.  2.  If you choose to wash your hair, wash your hair first as usual with your  normal  shampoo.  3.  After you shampoo, rinse your hair and body thoroughly to remove the  shampoo.                           4.  Use CHG as you would any other liquid soap.  You can apply chg directly  to the skin and wash                       Gently with a scrungie or clean washcloth.  5.  Apply the CHG Soap to your body ONLY FROM THE NECK DOWN.   Do not use on face/ open                           Wound or open sores. Avoid contact with eyes, ears mouth and genitals (private parts).                       Wash face,  Genitals (private parts) with your normal soap.             6.  Wash thoroughly, paying special attention to the area where your surgery  will be performed.  7.  Thoroughly rinse your body with warm water from the neck down.  8.  DO NOT shower/wash with your normal soap after using and rinsing off  the CHG Soap.                9.  Pat yourself dry with a clean towel.            10.  Wear clean pajamas.            11.  Place clean sheets on your bed the night of your first shower and do not  sleep with pets. Day of Surgery : Do not apply any lotions/deodorants the morning of surgery.  Please wear clean clothes to the hospital/surgery center.  FAILURE TO FOLLOW THESE INSTRUCTIONS MAY RESULT IN  THE CANCELLATION OF YOUR SURGERY PATIENT SIGNATURE_________________________________  NURSE SIGNATURE__________________________________  ________________________________________________________________________    Erik Gill  An incentive spirometer is a tool that can help keep your lungs clear and active. This tool measures how well you are filling your lungs with each breath. Taking long deep breaths may help reverse or decrease the chance of developing breathing (pulmonary) problems (especially infection) following: A long period of  time when you are unable to move or be active. BEFORE THE PROCEDURE  If the spirometer includes an indicator to show your best effort, your nurse or respiratory therapist will set it to a desired goal. If possible, sit up straight or lean slightly forward. Try not to slouch. Hold the incentive spirometer in an upright position. INSTRUCTIONS FOR USE  Sit on the edge of your bed if possible, or sit up as far as you can in bed or on a chair. Hold the incentive spirometer in an upright position. Breathe out normally. Place the mouthpiece in your mouth and seal your lips tightly around it. Breathe in slowly and as deeply as possible, raising the piston or the ball toward the top of the column. Hold your breath for 3-5 seconds or for as long as possible. Allow the piston or ball to fall to the bottom of the column. Remove the mouthpiece from your mouth and breathe out normally. Rest for a few seconds and repeat Steps 1 through 7 at least 10 times every 1-2 hours when you are awake. Take your time and take a few normal breaths between deep breaths. The spirometer may include an indicator to show your best effort. Use the indicator as a goal to work toward during each repetition. After each set of 10 deep breaths, practice coughing to be sure your lungs are clear. If you have an incision (the cut made at the time of surgery), support your incision when  coughing by placing a pillow or rolled up towels firmly against it. Once you are able to get out of bed, walk around indoors and cough well. You may stop using the incentive spirometer when instructed by your caregiver.  RISKS AND COMPLICATIONS Take your time so you do not get dizzy or light-headed. If you are in pain, you may need to take or ask for pain medication before doing incentive spirometry. It is harder to take a deep breath if you are having pain. AFTER USE Rest and breathe slowly and easily. It can be helpful to keep track of a log of your progress. Your caregiver can provide you with a simple table to help with this. If you are using the spirometer at home, follow these instructions: [place] IF:  You are having difficultly using the spirometer. You have trouble using the spirometer as often as instructed. Your pain medication is not giving enough relief while using the spirometer. You develop fever of 100.5 F (38.1 C) or higher. SEEK IMMEDIATE MEDICAL CARE IF:  You cough up bloody sputum that had not been present before. You develop fever of 102 F (38.9 C) or greater. You develop worsening pain at or near the incision site. MAKE SURE YOU:  Understand these instructions. Will watch your condition. Will get help right away if you are not doing well or get worse. Document Released: 04/02/2007 Document Revised: 02/12/2012 Document Reviewed: 06/03/2007 [facility] Patient Information 2014 [place], [place].   ________________________________________________________________________

## 2021-11-03 ENCOUNTER — Encounter (HOSPITAL_COMMUNITY): Payer: Self-pay

## 2021-11-03 ENCOUNTER — Encounter (HOSPITAL_COMMUNITY)
Admission: RE | Admit: 2021-11-03 | Discharge: 2021-11-03 | Disposition: A | Discharge status: Home / Self Care | Payer: Managed Care, Other (non HMO) | Source: Ambulatory Visit | Attending: Urology | Admitting: Urology

## 2021-11-03 ENCOUNTER — Ambulatory Visit: Payer: Managed Care, Other (non HMO) | Attending: Urology | Admitting: Physical Therapy

## 2021-11-03 ENCOUNTER — Encounter: Payer: Self-pay | Admitting: Physical Therapy

## 2021-11-03 ENCOUNTER — Other Ambulatory Visit: Payer: Self-pay

## 2021-11-03 DIAGNOSIS — I10 Essential (primary) hypertension: Secondary | ICD-10-CM | POA: Diagnosis not present

## 2021-11-03 DIAGNOSIS — Z01818 Encounter for other preprocedural examination: Secondary | ICD-10-CM | POA: Insufficient documentation

## 2021-11-03 DIAGNOSIS — M6281 Muscle weakness (generalized): Secondary | ICD-10-CM | POA: Insufficient documentation

## 2021-11-03 LAB — CBC
HCT: 41.5 % (ref 39.0–52.0)
Hemoglobin: 13.8 g/dL (ref 13.0–17.0)
MCH: 27.7 pg (ref 26.0–34.0)
MCHC: 33.3 g/dL (ref 30.0–36.0)
MCV: 83.2 fL (ref 80.0–100.0)
Platelets: 291 10*3/uL (ref 150–400)
RBC: 4.99 MIL/uL (ref 4.22–5.81)
RDW: 14.7 % (ref 11.5–15.5)
WBC: 8.9 10*3/uL (ref 4.0–10.5)
nRBC: 0 % (ref 0.0–0.2)

## 2021-11-03 LAB — BASIC METABOLIC PANEL
Anion gap: 8 (ref 5–15)
BUN: 16 mg/dL (ref 6–20)
CO2: 26 mmol/L (ref 22–32)
Calcium: 9.6 mg/dL (ref 8.9–10.3)
Chloride: 100 mmol/L (ref 98–111)
Creatinine, Ser: 1.15 mg/dL (ref 0.61–1.24)
GFR, Estimated: >60 mL/min (ref >60)
Glucose, Bld: 106 mg/dL — ABNORMAL HIGH (ref 70–99)
Potassium: 4 mmol/L (ref 3.5–5.1)
Sodium: 134 mmol/L — ABNORMAL LOW (ref 135–145)

## 2021-11-03 LAB — TYPE AND SCREEN
ABO/RH(D): O POS
Antibody Screen: NEGATIVE

## 2021-11-03 NOTE — Therapy (Signed)
[facility] @ [place], [place], [address] Phone: [phone]   Fax:  [phone]  Physical Therapy Evaluation  Patient Details  Name: Erik Gill MRN: [id number] Date of Birth: 09/29/1961 Referring Provider (PT): Raynelle Bring, MD  Encounter Date: 11/03/2021   PT End of Session - 11/03/21 1233     Visit Number 1    Date for PT Re-Evaluation 01/26/22    PT Start Time 1101    PT Stop Time 1145    PT Time Calculation (min) 44 min    Activity Tolerance Patient tolerated treatment well    Behavior During Therapy [facility] for tasks assessed/performed             Past Medical History:  Diagnosis Date   Elevated PSA    GERD (gastroesophageal reflux disease)    Headache    sinus related   Hypertension    Nocturia    Prostate cancer ([place])    Wears glasses     Past Surgical History:  Procedure Laterality Date   HERNIA REPAIR Right    PROSTATE BIOPSY N/A 08/29/2017   Procedure: BIOPSY TRANSRECTAL ULTRASONIC PROSTATE (TUBP);  Surgeon: Ceasar Mons, MD;  Location: [facility];  Service: Urology;  Laterality: N/A;    There were no vitals filed for this visit.    Subjective Assessment - 11/03/21 1107     Subjective Pt is having prosatectomy 11/14/21. Pt does some lifting at work and sometimes lifting up to 20-25 lb.  He is wanting to know what do after surgery.    Currently in Pain? No/denies                [facility] PT Assessment - 11/04/21 0001       Assessment   Medical Diagnosis C61 (ICD-10-CM) - Malignant neoplasm of prostate    Referring Provider (PT) Raynelle Bring, MD    Onset Date/Surgical Date --   surgery 11/14/21     Precautions   Precautions None      Restrictions   Weight Bearing Restrictions No      Balance Screen   Has the patient fallen in the past 6 months No      [place] residence    Living Arrangements  Spouse/significant other      Prior Function   Level of Independence Independent    Vocation Full time employment      Cognition   Overall Cognitive Status Within Functional Limits for tasks assessed      ROM / Strength   AROM / PROM / Strength AROM;PROM;Strength      AROM   Overall AROM Comments lumbar normal      PROM   Overall PROM Comments bil hip ER 75%      Strength   Overall Strength Comments hip grossly 5/5      Flexibility   Soft Tissue Assessment /Muscle Length yes    Hamstrings 80% Lt; 90% Rt      Palpation   Palpation comment bulging ab and holding breath with kegel and core activation - did training to do correctly      Ambulation/Gait   Gait Pattern Within Functional Limits                        Objective measurements completed on examination: See above findings.     Pelvic Floor Special Questions - 11/04/21 0001  External Palpation external outside of clothing teaching how to kegel and correctly do exercises              [facility] Adult PT Treatment/Exercise - 11/04/21 0001       Self-Care   Self-Care Other Self-Care Comments    Other Self-Care Comments  post prostatectomy aftercare; basic strength and ex; log roll                     PT Education - 11/03/21 1232     Education Details Access Code: [email or web address], intial protocol    Person(s) Educated Patient    Methods Explanation;Demonstration;Tactile cues;Verbal cues;Handout    Comprehension Verbalized understanding;Returned demonstration              PT Short Term Goals - 11/03/21 1233       PT SHORT TERM GOAL #1   Title ind with initial exercise plan for post prostatectomy    Time 1    Period Days    Status Achieved    Target Date 11/03/21               PT Long Term Goals - 11/04/21 0751       PT LONG TERM GOAL #1   Title Pt will be ind with advanced HEP to return to work related activities without leakage    Time 12    Period Weeks     Status New    Target Date 01/26/22      PT LONG TERM GOAL #2   Title Pt will be knowledgeable on how to manage pain post surgery and not have pain limiting any normal activities    Time 12    Period Weeks    Status New    Target Date 01/27/22                    Plan - 11/04/21 0753     Clinical Impression Statement Pt presents to clinic due to upcoming surgery for prostate removal.  Pt has tight hamstrings and decreased hip ER. Pt has some tension in diaphragm and using diaphragm when engaging the core and pelvic floor. Pt was educated in post surgical protocol and basic exercises, log rolling, hydration and bowel and bladder health.  Pt is recommended to schedule appointment to re-assess after surgery to ensure he is strengthening correctly and able to safely return to normal activities.    Personal Factors and Comorbidities Comorbidity 1    Comorbidities prostatectomy    Examination-Activity Limitations Lift;Carry;Reach Overhead    Examination-Participation Restrictions Community Activity;Occupation    Stability/Clinical Decision Making Evolving/Moderate complexity    Clinical Decision Making Low    PT Frequency 1x / week   maybe one more prior to surgery then will not return until post surgery   PT Duration 12 weeks    PT Treatment/Interventions ADLs/Self Care Home Management;Biofeedback;Cryotherapy;Electrical Stimulation;Moist Heat;Therapeutic activities;Therapeutic exercise;Neuromuscular re-education;Taping;Dry needling;Passive range of motion;Patient/family education;Manual techniques    PT Next Visit Plan re-assess and progress HEP    Consulted and Agree with Plan of Care Patient             Patient will benefit from skilled therapeutic intervention in order to improve the following deficits and impairments:  Postural dysfunction, Impaired flexibility, Decreased strength, Decreased coordination  Visit Diagnosis: Muscle weakness (generalized)     Problem  List Patient Active Problem List   Diagnosis Date Noted   Malignant neoplasm of prostate ([place]) 10/08/2017   Sinusitis,  chronic 07/15/2016   Smoker 07/15/2016   Essential hypertension 07/15/2016    Erik Gill, PT 11/04/2021, 9:14 AM  [facility] @ [place], [place], [address] Phone: [phone]   Fax:  [phone]  Name: Erik Gill MRN: [id number] Date of Birth: 07-26-1961

## 2021-11-03 NOTE — Patient Instructions (Addendum)
Pre-Prostectomy Physical Therapy Program  Water Intake: You should be drinking 8 glasses of water to decrease the irritants in the bladder and to promote healing. Reduce the number of bladder irritants 0-1 glasses per day. Incontinence Products: Briefs-underwear that have a padded area in the front Penile Clamp- clamp you place on the penis that is cushioned to stop the flow of urine Incontinence pads that is disposable Men's acticuf- pouch that goes around penis to manage light to moderate incontinence. Strengthening Program: Gluteal squeeze-lie on your back or sit in a chair. Squeeze your buttocks for 5 seconds 5 reps. every hour. Transverse Abdominus Contraction - lay on your back, push your belly button down to the mat, hands in front and curl upward to your shoulder blade 5x 3x per day. Pelvic floor contraction Piriformis stretch- supine and hip rotation Sitting hamstring stretch  After surgery  Catheter- after surgery a catheter will stay in for 7-10 days. After the catheter is removed you are able to resume the gluteal contraction, abdominal contractions and pelvic floor contractions Bed positioning- pillows to support right hip and knee in sidely. Caution: 1. Blood in urine 2. Increased pain in right leg at night   Access Code: [email or web address] URL: [email or web address] Date: 11/03/2021 Prepared by: Jari Favre  Exercises Hooklying Small March - 1 x daily - 7 x weekly - 2 sets - 10 reps Supine Hip Adductor Squeeze with Small Ball - 3 x daily - 7 x weekly - 1 sets - 10 reps - 3 sec hold Supine Bridge with Mini Swiss Ball Between Knees - 1 x daily - 7 x weekly - 3 sets - 10 reps Supine Hamstring Stretch with Strap - 1 x daily - 7 x weekly - 1 sets - 3 reps - 30 sec hold Supine Figure 4 Piriformis Stretch - 1 x daily - 7 x weekly - 1 sets - 3 reps - 30 sec hold Supine Gluteal Sets - 3 x daily - 7 x weekly - 1 sets - 10 reps - 5 sec hold Supine Transversus  Abdominis Bracing with Pelvic Floor Contraction - 1 x daily - 7 x weekly - 1 sets - 15 reps - 3 sec hold

## 2021-11-05 ENCOUNTER — Inpatient Hospital Stay: Admission: RE | Admit: 2021-11-05 | Payer: Managed Care, Other (non HMO) | Source: Ambulatory Visit

## 2021-11-05 DIAGNOSIS — R972 Elevated prostate specific antigen [PSA]: Secondary | ICD-10-CM | POA: Insufficient documentation

## 2021-11-08 ENCOUNTER — Other Ambulatory Visit: Payer: Self-pay | Admitting: Urology

## 2021-11-08 DIAGNOSIS — C61 Malignant neoplasm of prostate: Secondary | ICD-10-CM

## 2021-11-09 ENCOUNTER — Other Ambulatory Visit: Payer: Managed Care, Other (non HMO)

## 2021-11-09 ENCOUNTER — Ambulatory Visit
Admission: RE | Admit: 2021-11-09 | Discharge: 2021-11-09 | Disposition: A | Discharge status: Home / Self Care | Payer: Managed Care, Other (non HMO) | Source: Ambulatory Visit | Attending: Urology | Admitting: Urology

## 2021-11-09 ENCOUNTER — Other Ambulatory Visit: Payer: Self-pay

## 2021-11-09 DIAGNOSIS — C61 Malignant neoplasm of prostate: Secondary | ICD-10-CM

## 2021-11-09 MED ORDER — GADOBENATE DIMEGLUMINE 529 MG/ML IV SOLN
20.0000 mL | Freq: Once | INTRAVENOUS | Status: AC | PRN
  Administered 2021-11-09: 20 mL via INTRAVENOUS

## 2021-11-10 ENCOUNTER — Other Ambulatory Visit: Payer: Self-pay | Admitting: Urology

## 2021-11-10 LAB — SARS CORONAVIRUS 2 (TAT 6-24 HRS): SARS Coronavirus 2: NEGATIVE

## 2021-11-11 NOTE — H&P (Signed)
Office Visit Report     11/08/2021   --------------------------------------------------------------------------------   Erik Gill  MRN: [id number]  DOB: [birth year], 60 year old Male  SSN: [id number]   PRIMARY CARE:    REFERRING:  Minette Brine, MD  PROVIDER:  Raynelle Bring, M.D.  LOCATION:  [facility] [phone]     --------------------------------------------------------------------------------   CC/HPI: CC: Prostate Cancer   Erik Gill is a 60 year old gentleman with a strong family history of prostate cancer with a cousin and two brothers who have had prostate cancer. He reportedly had an elevated PSA of 10.6 in 2015 and was recommended to have a prostate biopsy but declined. His PSA further increased to 23.1 and he was seen by Dr. Aleen Campi and underwent a TRUS biopsy on 08/29/17 confirming Gleason 4+3=7 adenocarcinoma of the prostate with 7 out of 12 biopsy cores positive for cancer. He was seen by me in December of 2018 in consultation and was recommended to undergo therapy of curative intent for his high-risk prostate cancer. He has also been evaluated by Dr. Tammi Klippel to discuss radiotherapy options. He ultimately did not proceed with treatment as recommended. He finally followed up with Dr. Lovena Neighbours again in October of 2020 at which time his PSA was found to be further elevated at 38.1. He did undergo repeat staging imaging with a bone scan and CT scan of the abdomen and pelvis. The studies fortunately did not demonstrate evidence of obvious metastatic disease. He again saw me in consultation in December 2020 and was strongly advised to proceed with curative therapy. He again elected not to proceed with treatment as recommended. He has not followed up with urology since December 2020 and was seen by his PCP in July at which time his PSA was now noted to have increased to 74.37. He has remained fearful of proceeding with treatment opposed now sufficiently  concern to want to consider therapy if he has not progressed to incurable, metastatic disease. He presented to me again in September 2022 and eventually agreed to proceed with a PSMA PET scan. His repeat PSA was 59.8 and a PSMA PET scan in November 2022 was negative for metastatic disease. He confirmed his decision to proceed with surgical therapy and follows up today in preparation and to review his recent MRI of the prostate to determine appropriateness of nerve sparing.   Unfortunately, he was scheduled to have his MRI performed on 11/05/2021. He apparently did not show for this appointment.   Family history: Two brothers.   Imaging studies:  CT scan abd/pelvis (10/15/19): No metastatic disease, incidental coronary artery calcifications.  Bone scan (10/15/19): Scattered uptake consistent with degenerative changes without any strong suspicion of metastatic lesions.   PMH: He has a history of hypertension.  PSH: Inguinal hernia repair.   TNM stage: cT1c N0 M0  PSA: 74.37  Gleason score: 4+3=7  Biopsy (08/29/17): 7/12 cores positive  Left: L lateral apex (<5%, 3+3=6), L apex (10%, 3+3=6), L lateral mid (10%, 3+3=6), L mid (<5%, 3+3=6), L base (40%, 3+3=6)  Right: R lateral apex (90%, 4+3=7), R mid (10%, 3+4=7)  Prostate volume: 23.5 cc    Urinary function: IPSS is 16.  Erectile function: SHIM score is 12. He estimates that he can get an adequate erection for intercourse approximately 3 or 4 times out of 10. He will occasionally use Cialis with some help a tries not to use this all the time. He is concerned about his erectile  function.     ALLERGIES: No Allergies    MEDICATIONS: Alegra  Aspirin Ec 81 mg tablet, delayed release  Benzonatate  Cialis 20 mg tablet Oral  Lotrel 5 mg-20 mg capsule Oral     GU PSH: Locm 300-399Mg /Ml Iodine,1Ml - 2020, 2018 Prostate Needle Biopsy - 2018       PSH Notes: Inguinal Hernia Repair   NON-GU PSH: No Non-GU PSH    GU PMH: Prostate Cancer -  08/23/2021, [place] High Risk prostate cancer., - 2018, - 2018 ED due to arterial insufficiency - 2018 Elevated PSA - 2018, Elevated prostate specific antigen (PSA), - 2015 BPH w/LUTS, Benign localized prostatic hyperplasia with lower urinary tract symptoms (LUTS) - 2015    NON-GU PMH: Encounter for general adult medical examination without abnormal findings, Encounter for preventive health examination - 2015 Personal history of other diseases of the circulatory system, History of hypertension - 2015 Hypertension    FAMILY HISTORY: 2 daughters - Daughter 2 sons - Son Hypertension - Runs in Family malignant neoplasm of brain - Runs In Family   SOCIAL HISTORY: Marital Status: Single Preferred Language: English; Ethnicity: Not Hispanic Or Latino; Race: Black or African American Current Smoking Status: Patient smokes occasionally. Has smoked since 08/04/2014. Smokes 1/2 pack per day.   Tobacco Use Assessment Completed: Used Tobacco in last 30 days? Drinks 2 drinks per day.  Does not drink caffeine.     Notes: Former smoker, Father deceased, Single, Occupation, History of smoking, Caffeine use, Number of children, Alcohol use, Mother alive and healthy   REVIEW OF SYSTEMS:    GU Review Male:   Patient denies frequent urination, hard to postpone urination, burning/ pain with urination, get up at night to urinate, leakage of urine, stream starts and stops, trouble starting your streams, and have to strain to urinate .  Gastrointestinal (Upper):   Patient denies nausea and vomiting.  Gastrointestinal (Lower):   Patient denies diarrhea and constipation.  Constitutional:   Patient denies fever, night sweats, weight loss, and fatigue.  Skin:   Patient denies itching and skin rash/ lesion.  Eyes:   Patient denies blurred vision and double vision.  Ears/ Nose/ Throat:   Patient denies sore throat and sinus problems.  Hematologic/Lymphatic:   Patient denies swollen glands and easy bruising.   Cardiovascular:   Patient denies leg swelling and chest pains.  Respiratory:   Patient denies cough and shortness of breath.  Endocrine:   Patient denies excessive thirst.  Musculoskeletal:   Patient denies back pain and joint pain.  Neurological:   Patient denies headaches and dizziness.  Psychologic:   Patient denies depression and anxiety.   VITAL SIGNS:      11/08/2021 08:06 AM  Weight 207 lb / 93.89 kg  Height 68 in / 172.72 cm  BP 188/109 mmHg  Pulse 80 /min  Temperature 99.1 F / 37.2 C  BMI 31.5 kg/m   MULTI-SYSTEM PHYSICAL EXAMINATION:    Constitutional: Well-nourished. No physical deformities. Normally developed. Good grooming.  Neck: Neck symmetrical, not swollen. Normal tracheal position.  Respiratory: No labored breathing, no use of accessory muscles. Clear bilaterally.  Cardiovascular: Normal temperature, normal extremity pulses, no swelling, no varicosities. Regular rate and rhythm.  Lymphatic: No enlargement of neck, axillae, groin.  Skin: No paleness, no jaundice, no cyanosis. No lesion, no ulcer, no rash.  Neurologic / Psychiatric: Oriented to time, oriented to place, oriented to person. No depression, no anxiety, no agitation.  Gastrointestinal: No mass, no tenderness, no  rigidity, non obese abdomen.  Eyes: Normal conjunctivae. Normal eyelids.  Ears, Nose, Mouth, and Throat: Left ear no scars, no lesions, no masses. Right ear no scars, no lesions, no masses. Nose no scars, no lesions, no masses. Normal hearing. Normal lips.  Musculoskeletal: Normal gait and station of head and neck.     Complexity of Data:  Lab Test Review:   PSA  Records Review:   Pathology Reports, Previous Patient Records  X-Ray Review: PET Scan: Reviewed Films.     08/23/21 10/03/19 09/16/19 09/04/14  PSA  Total PSA 59.80 ng/mL 38.10 ng/mL 35.22 ng/dl 10.63    Notes:                     CLINICAL DATA: Prostate carcinoma with biochemical recurrence.   EXAM:  NUCLEAR MEDICINE PET SKULL  BASE TO THIGH   TECHNIQUE:  9.53 mCi F18 Piflufolastat (Pylarify) was injected intravenously.  Full-ring PET imaging was performed from the skull base to thigh  after the radiotracer. CT data was obtained and used for attenuation  correction and anatomic localization.   COMPARISON: None.   FINDINGS:  NECK   No radiotracer activity in neck lymph nodes.   Incidental CT finding: None   CHEST   None   Incidental CT finding: None   ABDOMEN/PELVIS   Prostate: Hypermetabolic focus within the central LEFT lobe of the  prostate gland with SUV max equal 19.4. Activity within the RIGHT  lobe is posterior towards the apical region with SUV max equal 12.5.   Lymph nodes: No abnormal radiotracer accumulation within pelvic or  abdominal nodes.   Liver: No evidence of liver metastasis   Incidental CT finding: None   SKELETON   No focal activity to suggest skeletal metastasis.   IMPRESSION:  1. Two focal areas of intense radiotracer activity within the  prostate gland are consistent with primary prostate carcinoma.  2. No evidence of metastatic adenopathy in the pelvis or periaortic  retroperitoneum.  3. No evidence of visceral metastasis or skeletal metastasis.    Electronically Signed  By: Suzy Bouchard M.D.  On: 10/07/2021 12:46   PROCEDURES:          Urinalysis Dipstick Dipstick Cont'd  Color: Straw Bilirubin: Neg mg/dL  Appearance: Clear Ketones: Neg mg/dL  Specific Gravity: 1.015 Blood: Neg ery/uL  pH: 6.5 Protein: Neg mg/dL  Glucose: Neg mg/dL Urobilinogen: 0.2 mg/dL    Nitrites: Neg    Leukocyte Esterase: Neg leu/uL    ASSESSMENT:      ICD-10 Details  1 GU:   Prostate Cancer - C61    PLAN:           Schedule Return Visit/Planned Activity: Keep Scheduled Appointment          Document Letter(s):  Created for Patient: Clinical Summary         Notes:   1. High risk prostate cancer: Unfortunately, Erik Gill did not proceed with his MRI of the  prostate prior to his appointment today. We were able to get this rescheduled in [facility] for tomorrow. We discussed the importance of this with regard to plans for nerve sparing. Considering his high risk disease and delayed treatment, he understands that if he did not have an MRI of the prostate, it would be difficult for me to recommend a nerve sparing procedure. He confirmed that he will plan to proceed with his MRI and definitive plans regarding nerve sparing will be pending this result.   We have  otherwise reviewed our goals of trying to provide him optimal chances for cancer cure, return of urinary function, and what we can do for regaining possible erectile function although he understands the lower odds for this. We discussed surgical therapy for prostate cancer including the different available surgical approaches. We discussed, in detail, the risks and expectations of surgery with regard to cancer control, urinary control, and erectile function as well as the expected postoperative recovery process. Additional risks of surgery including but not limited to bleeding, infection, hernia formation, nerve damage, lymphocele formation, bowel/rectal injury potentially necessitating colostomy, damage to the urinary tract resulting in urine leakage, urethral stricture, and the cardiopulmonary risks such as myocardial infarction, stroke, death, venothromboembolism, etc. were explained. The risk of open surgical conversion for robotic/laparoscopic prostatectomy was also discussed.   He confirms his decision to proceed with surgery next week as scheduled. He will proceed with a robot-assisted laparoscopic radical prostatectomy and bilateral pelvic lymphadenectomy with final plans regarding nerve sparing pending his MRI result tomorrow.   CC: Dr. Azalia Bilis        Next Appointment:      Next Appointment: 11/14/2021 11:30 AM    Appointment Type: Surgery     Location: [facility] [phone]    Provider: Raynelle Bring, M.D.    Reason for Visit: WL/EXT REC RA LAP RAD PROSTATECTOMY LEVEL 2, BPLA WITH AMANDA      E & M CODES: We spent 42 minutes dedicated to evaluation and management time, including face to face interaction, discussions on coordination of care, documentation, result review, and discussion with others as applicable.     * Signed by Raynelle Bring, M.D. on 11/08/21 at 1:30 PM (EST)*

## 2021-11-14 ENCOUNTER — Observation Stay (HOSPITAL_COMMUNITY)
Admission: RE | Admit: 2021-11-14 | Discharge: 2021-11-15 | Disposition: A | Discharge status: Home / Self Care | Payer: Managed Care, Other (non HMO) | Attending: Urology | Admitting: Urology

## 2021-11-14 ENCOUNTER — Ambulatory Visit (HOSPITAL_COMMUNITY): Payer: Managed Care, Other (non HMO) | Admitting: Anesthesiology

## 2021-11-14 ENCOUNTER — Encounter (HOSPITAL_COMMUNITY): Payer: Self-pay | Admitting: Urology

## 2021-11-14 ENCOUNTER — Encounter (HOSPITAL_COMMUNITY)
Admission: RE | Disposition: A | Discharge status: Home / Self Care | Payer: Self-pay | Source: Home / Self Care | Attending: Urology

## 2021-11-14 DIAGNOSIS — C61 Malignant neoplasm of prostate: Principal | ICD-10-CM | POA: Insufficient documentation

## 2021-11-14 DIAGNOSIS — I10 Essential (primary) hypertension: Secondary | ICD-10-CM | POA: Diagnosis not present

## 2021-11-14 DIAGNOSIS — Z7982 Long term (current) use of aspirin: Secondary | ICD-10-CM | POA: Diagnosis not present

## 2021-11-14 DIAGNOSIS — Z79899 Other long term (current) drug therapy: Secondary | ICD-10-CM | POA: Diagnosis not present

## 2021-11-14 DIAGNOSIS — Z8042 Family history of malignant neoplasm of prostate: Secondary | ICD-10-CM | POA: Insufficient documentation

## 2021-11-14 DIAGNOSIS — K30 Functional dyspepsia: Secondary | ICD-10-CM

## 2021-11-14 DIAGNOSIS — F17210 Nicotine dependence, cigarettes, uncomplicated: Secondary | ICD-10-CM | POA: Insufficient documentation

## 2021-11-14 HISTORY — PX: ROBOT ASSISTED LAPAROSCOPIC RADICAL PROSTATECTOMY: SHX5141

## 2021-11-14 HISTORY — PX: LYMPHADENECTOMY: SHX5960

## 2021-11-14 LAB — HEMOGLOBIN AND HEMATOCRIT, BLOOD
HCT: 40.8 % (ref 39.0–52.0)
Hemoglobin: 13 g/dL (ref 13.0–17.0)

## 2021-11-14 LAB — ABO/RH: ABO/RH(D): O POS

## 2021-11-14 SURGERY — XI ROBOTIC ASSISTED LAPAROSCOPIC RADICAL PROSTATECTOMY LEVEL 1
Anesthesia: General

## 2021-11-14 MED ORDER — ROCURONIUM BROMIDE 10 MG/ML (PF) SYRINGE
PREFILLED_SYRINGE | INTRAVENOUS | Status: AC
  Filled 2021-11-14: qty 10

## 2021-11-14 MED ORDER — SODIUM CHLORIDE 0.9 % IV BOLUS
1000.0000 mL | Freq: Once | INTRAVENOUS | Status: AC
  Administered 2021-11-14: 1000 mL via INTRAVENOUS

## 2021-11-14 MED ORDER — BUPIVACAINE-EPINEPHRINE (PF) 0.25% -1:200000 IJ SOLN
INTRAMUSCULAR | Status: AC
  Filled 2021-11-14: qty 30

## 2021-11-14 MED ORDER — ROCURONIUM BROMIDE 10 MG/ML (PF) SYRINGE
PREFILLED_SYRINGE | INTRAVENOUS | Status: DC | PRN
  Administered 2021-11-14: 20 mg via INTRAVENOUS
  Administered 2021-11-14: 100 mg via INTRAVENOUS
  Administered 2021-11-14: 20 mg via INTRAVENOUS

## 2021-11-14 MED ORDER — BELLADONNA ALKALOIDS-OPIUM 16.2-60 MG RE SUPP: 1.0000 | Freq: Four times a day (QID) | RECTAL | Status: DC | PRN

## 2021-11-14 MED ORDER — DIPHENHYDRAMINE HCL 12.5 MG/5ML PO ELIX: 12.5000 mg | ORAL_SOLUTION | Freq: Four times a day (QID) | ORAL | Status: DC | PRN

## 2021-11-14 MED ORDER — BENAZEPRIL HCL 10 MG PO TABS
40.0000 mg | ORAL_TABLET | Freq: Every day | ORAL | Status: DC
  Administered 2021-11-14 – 2021-11-15 (×2): 40 mg via ORAL
  Filled 2021-11-14 (×2): qty 4

## 2021-11-14 MED ORDER — FLUTICASONE PROPIONATE 50 MCG/ACT NA SUSP
2.0000 | Freq: Every day | NASAL | Status: DC | PRN
  Filled 2021-11-14: qty 16

## 2021-11-14 MED ORDER — AMISULPRIDE (ANTIEMETIC) 5 MG/2ML IV SOLN: 10.0000 mg | Freq: Once | INTRAVENOUS | Status: DC | PRN

## 2021-11-14 MED ORDER — SULFAMETHOXAZOLE-TRIMETHOPRIM 800-160 MG PO TABS: 1.0000 | ORAL_TABLET | Freq: Two times a day (BID) | ORAL | 0 refills | Status: DC

## 2021-11-14 MED ORDER — CEFAZOLIN SODIUM-DEXTROSE 2-4 GM/100ML-% IV SOLN
2.0000 g | Freq: Once | INTRAVENOUS | Status: AC
  Administered 2021-11-14: 2 g via INTRAVENOUS
  Filled 2021-11-14: qty 100

## 2021-11-14 MED ORDER — LORATADINE 10 MG PO TABS
10.0000 mg | ORAL_TABLET | Freq: Every day | ORAL | Status: DC
  Administered 2021-11-14 – 2021-11-15 (×2): 10 mg via ORAL
  Filled 2021-11-14 (×2): qty 1

## 2021-11-14 MED ORDER — ACETAMINOPHEN 500 MG PO TABS
1000.0000 mg | ORAL_TABLET | Freq: Once | ORAL | Status: AC
  Administered 2021-11-14: 1000 mg via ORAL
  Filled 2021-11-14: qty 2

## 2021-11-14 MED ORDER — EPHEDRINE SULFATE-NACL 50-0.9 MG/10ML-% IV SOSY
PREFILLED_SYRINGE | INTRAVENOUS | Status: DC | PRN
  Administered 2021-11-14: 10 mg via INTRAVENOUS

## 2021-11-14 MED ORDER — LIDOCAINE HCL (PF) 2 % IJ SOLN
INTRAMUSCULAR | Status: AC
  Filled 2021-11-14: qty 5

## 2021-11-14 MED ORDER — ACETAMINOPHEN 325 MG PO TABS
650.0000 mg | ORAL_TABLET | ORAL | Status: DC | PRN
  Administered 2021-11-14: 650 mg via ORAL
  Filled 2021-11-14: qty 2

## 2021-11-14 MED ORDER — MIDAZOLAM HCL 5 MG/5ML IJ SOLN
INTRAMUSCULAR | Status: DC | PRN
  Administered 2021-11-14: 2 mg via INTRAVENOUS

## 2021-11-14 MED ORDER — KETAMINE HCL-SODIUM CHLORIDE 100-0.9 MG/10ML-% IV SOSY
PREFILLED_SYRINGE | INTRAVENOUS | Status: AC
  Filled 2021-11-14: qty 10

## 2021-11-14 MED ORDER — TRAMADOL HCL 50 MG PO TABS: 50.0000 mg | ORAL_TABLET | Freq: Four times a day (QID) | ORAL | 0 refills | Status: DC | PRN

## 2021-11-14 MED ORDER — METOPROLOL SUCCINATE ER 25 MG PO TB24
25.0000 mg | ORAL_TABLET | Freq: Every day | ORAL | Status: DC
  Administered 2021-11-15: 25 mg via ORAL
  Filled 2021-11-14: qty 1

## 2021-11-14 MED ORDER — BACITRACIN-NEOMYCIN-POLYMYXIN 400-5-5000 EX OINT: 1.0000 "application " | TOPICAL_OINTMENT | Freq: Three times a day (TID) | CUTANEOUS | Status: DC | PRN

## 2021-11-14 MED ORDER — STERILE WATER FOR IRRIGATION IR SOLN
Status: DC | PRN
  Administered 2021-11-14: 1000 mL

## 2021-11-14 MED ORDER — FENTANYL CITRATE (PF) 250 MCG/5ML IJ SOLN
INTRAMUSCULAR | Status: DC | PRN
  Administered 2021-11-14: 50 ug via INTRAVENOUS
  Administered 2021-11-14: 100 ug via INTRAVENOUS

## 2021-11-14 MED ORDER — KETOROLAC TROMETHAMINE 15 MG/ML IJ SOLN
15.0000 mg | Freq: Four times a day (QID) | INTRAMUSCULAR | Status: DC
  Administered 2021-11-14 – 2021-11-15 (×3): 15 mg via INTRAVENOUS
  Filled 2021-11-14 (×3): qty 1

## 2021-11-14 MED ORDER — CEFAZOLIN SODIUM-DEXTROSE 1-4 GM/50ML-% IV SOLN
1.0000 g | Freq: Three times a day (TID) | INTRAVENOUS | Status: AC
  Administered 2021-11-14 – 2021-11-15 (×2): 1 g via INTRAVENOUS
  Filled 2021-11-14 (×2): qty 50

## 2021-11-14 MED ORDER — ONDANSETRON HCL 4 MG/2ML IJ SOLN
INTRAMUSCULAR | Status: AC
  Filled 2021-11-14: qty 2

## 2021-11-14 MED ORDER — LIDOCAINE HCL (PF) 2 % IJ SOLN
INTRAMUSCULAR | Status: DC | PRN
  Administered 2021-11-14: 1.5 mg/kg/h via INTRADERMAL

## 2021-11-14 MED ORDER — DEXAMETHASONE SODIUM PHOSPHATE 10 MG/ML IJ SOLN
INTRAMUSCULAR | Status: DC | PRN
  Administered 2021-11-14: 10 mg via INTRAVENOUS

## 2021-11-14 MED ORDER — PROPOFOL 10 MG/ML IV BOLUS
INTRAVENOUS | Status: AC
  Filled 2021-11-14: qty 20

## 2021-11-14 MED ORDER — NAPHAZOLINE-PHENIRAMINE 0.025-0.3 % OP SOLN
1.0000 [drp] | Freq: Every day | OPHTHALMIC | Status: DC | PRN
  Filled 2021-11-14: qty 15

## 2021-11-14 MED ORDER — LIDOCAINE 2% (20 MG/ML) 5 ML SYRINGE
INTRAMUSCULAR | Status: DC | PRN
Start: 2021-11-14 — End: 2021-11-14
  Administered 2021-11-14: 100 mg via INTRAVENOUS

## 2021-11-14 MED ORDER — LACTATED RINGERS IV SOLN
INTRAVENOUS | Status: DC | PRN
  Administered 2021-11-14: 1000 mL

## 2021-11-14 MED ORDER — DIPHENHYDRAMINE HCL 50 MG/ML IJ SOLN: 12.5000 mg | Freq: Four times a day (QID) | INTRAMUSCULAR | Status: DC | PRN

## 2021-11-14 MED ORDER — DOCUSATE SODIUM 100 MG PO CAPS
100.0000 mg | ORAL_CAPSULE | Freq: Two times a day (BID) | ORAL | Status: DC
  Administered 2021-11-14 – 2021-11-15 (×2): 100 mg via ORAL
  Filled 2021-11-14 (×2): qty 1

## 2021-11-14 MED ORDER — FLEET ENEMA 7-19 GM/118ML RE ENEM: 1.0000 | ENEMA | Freq: Once | RECTAL | Status: DC

## 2021-11-14 MED ORDER — AMLODIPINE BESYLATE 10 MG PO TABS
10.0000 mg | ORAL_TABLET | Freq: Every day | ORAL | Status: DC
  Administered 2021-11-14 – 2021-11-15 (×2): 10 mg via ORAL
  Filled 2021-11-14 (×2): qty 1

## 2021-11-14 MED ORDER — ZOLPIDEM TARTRATE 5 MG PO TABS: 5.0000 mg | ORAL_TABLET | Freq: Every evening | ORAL | Status: DC | PRN

## 2021-11-14 MED ORDER — PHENYLEPHRINE HCL (PRESSORS) 10 MG/ML IV SOLN
INTRAVENOUS | Status: AC
  Filled 2021-11-14: qty 1

## 2021-11-14 MED ORDER — LIDOCAINE HCL (PF) 2 % IJ SOLN
INTRAMUSCULAR | Status: AC
  Filled 2021-11-14: qty 15

## 2021-11-14 MED ORDER — PHENYLEPHRINE HCL-NACL 20-0.9 MG/250ML-% IV SOLN
INTRAVENOUS | Status: DC | PRN
  Administered 2021-11-14: 50 ug/min via INTRAVENOUS

## 2021-11-14 MED ORDER — LACTATED RINGERS IV SOLN: INTRAVENOUS | Status: DC

## 2021-11-14 MED ORDER — PHENYLEPHRINE 40 MCG/ML (10ML) SYRINGE FOR IV PUSH (FOR BLOOD PRESSURE SUPPORT)
PREFILLED_SYRINGE | INTRAVENOUS | Status: AC
  Filled 2021-11-14: qty 10

## 2021-11-14 MED ORDER — PROPOFOL 10 MG/ML IV BOLUS
INTRAVENOUS | Status: DC | PRN
  Administered 2021-11-14: 180 mg via INTRAVENOUS

## 2021-11-14 MED ORDER — HEPARIN SODIUM (PORCINE) 1000 UNIT/ML IJ SOLN
INTRAMUSCULAR | Status: AC
  Filled 2021-11-14: qty 1

## 2021-11-14 MED ORDER — DOCUSATE SODIUM 100 MG PO CAPS: 100.0000 mg | ORAL_CAPSULE | Freq: Two times a day (BID) | ORAL | Status: DC

## 2021-11-14 MED ORDER — ORAL CARE MOUTH RINSE
15.0000 mL | Freq: Once | OROMUCOSAL | Status: AC
  Administered 2021-11-14: 15 mL via OROMUCOSAL

## 2021-11-14 MED ORDER — FENTANYL CITRATE (PF) 250 MCG/5ML IJ SOLN
INTRAMUSCULAR | Status: AC
  Filled 2021-11-14: qty 5

## 2021-11-14 MED ORDER — SODIUM CHLORIDE 0.9 % IR SOLN
Status: DC | PRN
  Administered 2021-11-14: 1000 mL via INTRAVESICAL

## 2021-11-14 MED ORDER — BUPIVACAINE-EPINEPHRINE 0.25% -1:200000 IJ SOLN
INTRAMUSCULAR | Status: DC | PRN
  Administered 2021-11-14: 30 mL

## 2021-11-14 MED ORDER — MIDAZOLAM HCL 2 MG/2ML IJ SOLN
INTRAMUSCULAR | Status: AC
  Filled 2021-11-14: qty 2

## 2021-11-14 MED ORDER — KETAMINE HCL 10 MG/ML IJ SOLN
INTRAMUSCULAR | Status: DC | PRN
  Administered 2021-11-14: 10 mg via INTRAVENOUS
  Administered 2021-11-14: 20 mg via INTRAVENOUS
  Administered 2021-11-14: 10 mg via INTRAVENOUS

## 2021-11-14 MED ORDER — FENTANYL CITRATE PF 50 MCG/ML IJ SOSY
25.0000 ug | PREFILLED_SYRINGE | INTRAMUSCULAR | Status: DC | PRN
  Administered 2021-11-14 (×2): 25 ug via INTRAVENOUS

## 2021-11-14 MED ORDER — PHENYLEPHRINE 40 MCG/ML (10ML) SYRINGE FOR IV PUSH (FOR BLOOD PRESSURE SUPPORT)
PREFILLED_SYRINGE | INTRAVENOUS | Status: DC | PRN
  Administered 2021-11-14 (×4): 80 ug via INTRAVENOUS
  Administered 2021-11-14 (×2): 40 ug via INTRAVENOUS

## 2021-11-14 MED ORDER — ALBUMIN HUMAN 5 % IV SOLN: INTRAVENOUS | Status: DC | PRN

## 2021-11-14 MED ORDER — KCL IN DEXTROSE-NACL 20-5-0.45 MEQ/L-%-% IV SOLN
INTRAVENOUS | Status: DC
  Filled 2021-11-14 (×3): qty 1000

## 2021-11-14 MED ORDER — POLYETHYLENE GLYCOL 3350 17 G PO PACK: 17.0000 g | PACK | Freq: Every day | ORAL | Status: DC

## 2021-11-14 MED ORDER — FENTANYL CITRATE PF 50 MCG/ML IJ SOSY
PREFILLED_SYRINGE | INTRAMUSCULAR | Status: AC
  Filled 2021-11-14: qty 1

## 2021-11-14 MED ORDER — CELECOXIB 200 MG PO CAPS
200.0000 mg | ORAL_CAPSULE | Freq: Once | ORAL | Status: AC
  Administered 2021-11-14: 200 mg via ORAL
  Filled 2021-11-14: qty 1

## 2021-11-14 MED ORDER — SUGAMMADEX SODIUM 200 MG/2ML IV SOLN
INTRAVENOUS | Status: DC | PRN
  Administered 2021-11-14: 200 mg via INTRAVENOUS

## 2021-11-14 MED ORDER — ONDANSETRON HCL 4 MG/2ML IJ SOLN
INTRAMUSCULAR | Status: DC | PRN
  Administered 2021-11-14: 4 mg via INTRAVENOUS

## 2021-11-14 MED ORDER — PROMETHAZINE HCL 25 MG/ML IJ SOLN: 6.2500 mg | INTRAMUSCULAR | Status: DC | PRN

## 2021-11-14 MED ORDER — CHLORHEXIDINE GLUCONATE 0.12 % MT SOLN: 15.0000 mL | Freq: Once | OROMUCOSAL | Status: AC

## 2021-11-14 MED ORDER — MORPHINE SULFATE (PF) 2 MG/ML IV SOLN: 2.0000 mg | INTRAVENOUS | Status: DC | PRN

## 2021-11-14 MED ORDER — ALUM & MAG HYDROXIDE-SIMETH 200-200-20 MG/5ML PO SUSP
15.0000 mL | Freq: Four times a day (QID) | ORAL | Status: DC | PRN
  Administered 2021-11-14: 15 mL via ORAL
  Filled 2021-11-14: qty 30

## 2021-11-14 MED ORDER — ONDANSETRON HCL 4 MG/2ML IJ SOLN: 4.0000 mg | INTRAMUSCULAR | Status: DC | PRN

## 2021-11-14 SURGICAL SUPPLY — 62 items
APPLICATOR COTTON TIP 6 STRL (MISCELLANEOUS) ×2 IMPLANT
APPLICATOR COTTON TIP 6IN STRL (MISCELLANEOUS) ×3
BAG COUNTER SPONGE SURGICOUNT (BAG) IMPLANT
CATH FOLEY 2WAY SLVR 18FR 30CC (CATHETERS) ×3 IMPLANT
CATH ROBINSON RED A/P 16FR (CATHETERS) ×3 IMPLANT
CATH ROBINSON RED A/P 8FR (CATHETERS) ×3 IMPLANT
CATH TIEMANN FOLEY 18FR 5CC (CATHETERS) ×3 IMPLANT
CHLORAPREP W/TINT 26 (MISCELLANEOUS) ×3 IMPLANT
CLIP LIGATING HEM O LOK PURPLE (MISCELLANEOUS) ×6 IMPLANT
COVER SURGICAL LIGHT HANDLE (MISCELLANEOUS) ×3 IMPLANT
COVER TIP SHEARS 8 DVNC (MISCELLANEOUS) ×2 IMPLANT
COVER TIP SHEARS 8MM DA VINCI (MISCELLANEOUS) ×3
CUTTER ECHEON FLEX ENDO 45 340 (ENDOMECHANICALS) ×3 IMPLANT
DECANTER SPIKE VIAL GLASS SM (MISCELLANEOUS) ×3 IMPLANT
DERMABOND ADVANCED (GAUZE/BANDAGES/DRESSINGS) ×1
DERMABOND ADVANCED .7 DNX12 (GAUZE/BANDAGES/DRESSINGS) ×2 IMPLANT
DRAIN CHANNEL RND F F (WOUND CARE) IMPLANT
DRAPE ARM DVNC X/XI (DISPOSABLE) ×8 IMPLANT
DRAPE COLUMN DVNC XI (DISPOSABLE) ×2 IMPLANT
DRAPE DA VINCI XI ARM (DISPOSABLE) ×12
DRAPE DA VINCI XI COLUMN (DISPOSABLE) ×3
DRAPE SURG IRRIG POUCH 19X23 (DRAPES) ×3 IMPLANT
DRSG TEGADERM 4X4.75 (GAUZE/BANDAGES/DRESSINGS) ×3 IMPLANT
ELECT PENCIL ROCKER SW 15FT (MISCELLANEOUS) ×3 IMPLANT
ELECT REM PT RETURN 15FT ADLT (MISCELLANEOUS) ×3 IMPLANT
GAUZE 4X4 16PLY ~~LOC~~+RFID DBL (SPONGE) ×3 IMPLANT
GAUZE SPONGE 4X4 12PLY STRL (GAUZE/BANDAGES/DRESSINGS) ×3 IMPLANT
GLOVE SURG ENC MOIS LTX SZ6.5 (GLOVE) ×3 IMPLANT
GLOVE SURG ENC TEXT LTX SZ7.5 (GLOVE) ×6 IMPLANT
GOWN STRL REUS W/TWL LRG LVL3 (GOWN DISPOSABLE) ×9 IMPLANT
HOLDER FOLEY CATH W/STRAP (MISCELLANEOUS) ×3 IMPLANT
IRRIG SUCT STRYKERFLOW 2 WTIP (MISCELLANEOUS) ×3
IRRIGATION SUCT STRKRFLW 2 WTP (MISCELLANEOUS) ×2 IMPLANT
IV LACTATED RINGERS 1000ML (IV SOLUTION) ×3 IMPLANT
KIT TURNOVER KIT A (KITS) IMPLANT
NDL SAFETY ECLIPSE 18X1.5 (NEEDLE) ×2 IMPLANT
NEEDLE HYPO 18GX1.5 SHARP (NEEDLE) ×3
PACK ROBOT UROLOGY CUSTOM (CUSTOM PROCEDURE TRAY) ×3 IMPLANT
PENCIL SMOKE EVACUATOR (MISCELLANEOUS) IMPLANT
RELOAD STAPLE 45 4.1 GRN THCK (STAPLE) ×2 IMPLANT
SEAL CANN UNIV 5-8 DVNC XI (MISCELLANEOUS) ×8 IMPLANT
SEAL XI 5MM-8MM UNIVERSAL (MISCELLANEOUS) ×12
SET TUBE SMOKE EVAC HIGH FLOW (TUBING) ×3 IMPLANT
SOLUTION ELECTROLUBE (MISCELLANEOUS) ×3 IMPLANT
STAPLE RELOAD 45 GRN (STAPLE) ×2 IMPLANT
STAPLE RELOAD 45MM GREEN (STAPLE) ×3
SUT ETHILON 3 0 PS 1 (SUTURE) ×3 IMPLANT
SUT MNCRL 3 0 RB1 (SUTURE) ×2 IMPLANT
SUT MNCRL 3 0 VIOLET RB1 (SUTURE) ×2 IMPLANT
SUT MNCRL AB 4-0 PS2 18 (SUTURE) ×6 IMPLANT
SUT MONOCRYL 3 0 RB1 (SUTURE) ×6
SUT PDS PLUS 0 (SUTURE) ×6
SUT PDS PLUS AB 0 CT-2 (SUTURE) ×4 IMPLANT
SUT VIC AB 0 CT1 27 (SUTURE) ×3
SUT VIC AB 0 CT1 27XBRD ANTBC (SUTURE) ×2 IMPLANT
SUT VIC AB 0 UR5 27 (SUTURE) ×3 IMPLANT
SUT VIC AB 2-0 SH 27 (SUTURE) ×3
SUT VIC AB 2-0 SH 27X BRD (SUTURE) ×2 IMPLANT
SYR 27GX1/2 1ML LL SAFETY (SYRINGE) ×3 IMPLANT
TOWEL OR NON WOVEN STRL DISP B (DISPOSABLE) ×3 IMPLANT
TROCAR XCEL NON-BLD 5MMX100MML (ENDOMECHANICALS) IMPLANT
WATER STERILE IRR 1000ML POUR (IV SOLUTION) ×3 IMPLANT

## 2021-11-14 NOTE — Discharge Instructions (Signed)

## 2021-11-14 NOTE — Transfer of Care (Signed)
Immediate Anesthesia Transfer of Care Note  Patient: Erik Gill  Procedure(s) Performed: XI ROBOTIC ASSISTED LAPAROSCOPIC RADICAL PROSTATECTOMY LEVEL 1 LYMPHADENECTOMY, PELVIC (Bilateral)  Patient Location: PACU  Anesthesia Type:General  Level of Consciousness: awake and alert   Airway & Oxygen Therapy: Patient Spontanous Breathing and Patient connected to face mask oxygen  Post-op Assessment: Report given to RN and Post -op Vital signs reviewed and stable  Post vital signs: Reviewed and stable  Last Vitals:  Vitals Value Taken Time  BP 174/99 11/14/21 1522  Temp    Pulse 81 11/14/21 1523  Resp 27 11/14/21 1523  SpO2 100 % 11/14/21 1523  Vitals shown include unvalidated device data.  Last Pain:  Vitals:   11/14/21 0921  TempSrc: Oral         Complications: No notable events documented.

## 2021-11-14 NOTE — Progress Notes (Signed)
Upon pre-op patient verbalized he didn't complete full liquid diet yesterday. He had all liquids except for 2 [place] sandwiches yesterday at 1600. He did to enema. Dr. Alinda Money made aware and wants to proceed with procedure.

## 2021-11-14 NOTE — Anesthesia Postprocedure Evaluation (Signed)
Anesthesia Post Note  Patient: Erik Gill  Procedure(s) Performed: XI ROBOTIC ASSISTED LAPAROSCOPIC RADICAL PROSTATECTOMY LEVEL 1 LYMPHADENECTOMY, PELVIC (Bilateral)     Patient location during evaluation: PACU Anesthesia Type: General Level of consciousness: awake Pain management: pain level controlled Vital Signs Assessment: post-procedure vital signs reviewed and stable Respiratory status: spontaneous breathing Cardiovascular status: stable Postop Assessment: no apparent nausea or vomiting Anesthetic complications: no   No notable events documented.  Last Vitals:  Vitals:   11/14/21 1545 11/14/21 1600  BP: (!) 169/92 (!) 171/97  Pulse: 76 81  Resp: 19 19  Temp:    SpO2: 99% 96%    Last Pain:  Vitals:   11/14/21 1600  TempSrc:   PainSc: 3                  Chantrice Hagg

## 2021-11-14 NOTE — Anesthesia Procedure Notes (Signed)
Procedure Name: Intubation Date/Time: 11/14/2021 12:03 PM Performed by: Myna Bright, CRNA Pre-anesthesia Checklist: Patient identified, Emergency Drugs available, Suction available and Patient being monitored Patient Re-evaluated:Patient Re-evaluated prior to induction Oxygen Delivery Method: Circle system utilized Preoxygenation: Pre-oxygenation with 100% oxygen Induction Type: IV induction Ventilation: Mask ventilation without difficulty Laryngoscope Size: Mac and 4 Grade View: Grade II Tube type: Oral Tube size: 7.5 mm Number of attempts: 1 Airway Equipment and Method: Stylet Placement Confirmation: ETT inserted through vocal cords under direct vision, positive ETCO2 and breath sounds checked- equal and bilateral Secured at: 22 cm Tube secured with: Tape Dental Injury: Teeth and Oropharynx as per pre-operative assessment

## 2021-11-14 NOTE — Interval H&P Note (Signed)
History and Physical Interval Note:  11/14/2021 10:54 AM  Erik Gill  has presented today for surgery, with the diagnosis of PROSTATE CANCER.  The various methods of treatment have been discussed with the patient and family. After consideration of risks, benefits and other options for treatment, the patient has consented to  Procedure(s): XI ROBOTIC ASSISTED LAPAROSCOPIC RADICAL PROSTATECTOMY LEVEL 1 (N/A) LYMPHADENECTOMY, PELVIC (Bilateral) as a surgical intervention.  The patient's history has been reviewed, patient examined, no change in status, stable for surgery.  I have reviewed the patient's chart and labs.  Questions were answered to the patient's satisfaction.     [employer]

## 2021-11-14 NOTE — Anesthesia Preprocedure Evaluation (Addendum)
Anesthesia Evaluation  Patient identified by MRN, date of birth, ID band Patient awake    Reviewed: Allergy & Precautions, Patient's Chart, lab work & pertinent test results, reviewed documented beta blocker date and time   History of Anesthesia Complications Negative for: history of anesthetic complications  Airway Mallampati: II       Dental  (+) Dental Advisory Given   Pulmonary Current SmokerPatient did not abstain from smoking.,    Pulmonary exam normal        Cardiovascular hypertension, Pt. on medications and Pt. on home beta blockers negative cardio ROS Normal cardiovascular exam     Neuro/Psych negative neurological ROS     GI/Hepatic Neg liver ROS, GERD  Medicated,  Endo/Other  negative endocrine ROS  Renal/GU negative Renal ROS     Musculoskeletal negative musculoskeletal ROS (+)   Abdominal   Peds  Hematology negative hematology ROS (+)   Anesthesia Other Findings   Reproductive/Obstetrics                            Anesthesia Physical Anesthesia Plan  ASA: 2  Anesthesia Plan: General   Post-op Pain Management: Celebrex PO (pre-op) and Tylenol PO (pre-op)   Induction: Intravenous  PONV Risk Score and Plan: 3 and Ondansetron, Dexamethasone and Midazolam  Airway Management Planned: Oral ETT  Additional Equipment:   Intra-op Plan:   Post-operative Plan: Extubation in OR  Informed Consent: I have reviewed the patients History and Physical, chart, labs and discussed the procedure including the risks, benefits and alternatives for the proposed anesthesia with the patient or authorized representative who has indicated his/her understanding and acceptance.     Dental advisory given  Plan Discussed with: Anesthesiologist and CRNA  Anesthesia Plan Comments:        Anesthesia Quick Evaluation

## 2021-11-14 NOTE — Op Note (Signed)
Preoperative diagnosis: Clinically localized adenocarcinoma of the prostate (clinical stage T1c N0 M0)  Postoperative diagnosis: Clinically localized adenocarcinoma of the prostate (clinical stage T1c N0 M0)  Procedure:  Robotic assisted laparoscopic radical prostatectomy (bilateral nerve sparing) Bilateral robotic assisted laparoscopic pelvic lymphadenectomy  Surgeon: Pryor Curia. M.D.  Assistant: Debbrah Alar, PA-C  An assistant was required for this surgical procedure.  The duties of the assistant included but were not limited to suctioning, passing suture, camera manipulation, retraction. This procedure would not be able to be performed without an Environmental consultant.  Resdient: Dr. Aldine Contes  Anesthesia: General  Complications: None  EBL: 150 mL  IVF:  2000 mL crystalloid, 250 cc colloid  Specimens: Prostate and seminal vesicles Right pelvic lymph nodes Left pelvic lymph nodes  Disposition of specimens: Pathology  Drains: 20 Fr coude catheter # 19 Blake pelvic drain  Indication: Erik Gill is a 60 y.o. year old patient with clinically localized prostate cancer.  After a thorough review of the management options for treatment of prostate cancer, he elected to proceed with surgical therapy and the above procedure(s).  We have discussed the potential benefits and risks of the procedure, side effects of the proposed treatment, the likelihood of the patient achieving the goals of the procedure, and any potential problems that might occur during the procedure or recuperation. Informed consent has been obtained.  Description of procedure:  The patient was taken to the operating room and a general anesthetic was administered. He was given preoperative antibiotics, placed in the dorsal lithotomy position, and prepped and draped in the usual sterile fashion. Next a preoperative timeout was performed. A urethral catheter was placed into the bladder and a site was selected near  the umbilicus for placement of the camera port. This was placed using a standard open Hassan technique which allowed entry into the peritoneal cavity under direct vision and without difficulty. An 8 mm robotic port was placed and a pneumoperitoneum established. The camera was then used to inspect the abdomen and there was no evidence of any intra-abdominal injuries or other abnormalities. The remaining abdominal ports were then placed. 8 mm robotic ports were placed in the right lower quadrant, left lower quadrant, and far left lateral abdominal wall. A 5 mm port was placed in the right upper quadrant and a 12 mm port was placed in the right lateral abdominal wall for laparoscopic assistance. All ports were placed under direct vision without difficulty. The surgical cart was then docked.   Utilizing the cautery scissors, the bladder was reflected posteriorly allowing entry into the space of Retzius and identification of the endopelvic fascia and prostate. The periprostatic fat was then removed from the prostate allowing full exposure of the endopelvic fascia. The endopelvic fascia was then incised from the apex back to the base of the prostate bilaterally and the underlying levator muscle fibers were swept laterally off the prostate thereby isolating the dorsal venous complex. The dorsal vein was then stapled and divided with a 45 mm Flex Echelon stapler. Attention then turned to the bladder neck which was divided anteriorly thereby allowing entry into the bladder and exposure of the urethral catheter. The catheter balloon was deflated and the catheter was brought into the operative field and used to retract the prostate anteriorly. The posterior bladder neck was then examined and was divided allowing further dissection between the bladder and prostate posteriorly until the vasa deferentia and seminal vessels were identified. The vasa deferentia were isolated, divided, and lifted anteriorly.  The seminal vesicles  were dissected down to their tips with care to control the seminal vascular arterial blood supply. These structures were then lifted anteriorly and the space between Denonvillier's fascia and the anterior rectum was developed with a combination of sharp and blunt dissection. This isolated the vascular pedicles of the prostate.  The lateral prostatic fascia was then sharply incised allowing release of the neurovascular bundles bilaterally. The vascular pedicles of the prostate were then ligated with Weck clips between the prostate and neurovascular bundles and divided with sharp cold scissor dissection resulting in neurovascular bundle preservation. The neurovascular bundles were then separated off the apex of the prostate and urethra bilaterally.  The urethra was then sharply transected allowing the prostate specimen to be disarticulated. The pelvis was copiously irrigated and hemostasis was ensured. There was no evidence for rectal injury.  Attention then turned to the right pelvic sidewall. The fibrofatty tissue between the external iliac vein, confluence of the iliac vessels, hypogastric artery, and Cooper's ligament was dissected free from the pelvic sidewall with care to preserve the obturator nerve. Weck clips were used for lymphostasis and hemostasis. An identical procedure was performed on the contralateral side and the lymphatic packets were removed for permanent pathologic analysis.  Attention then turned to the urethral anastomosis. A 2-0 Vicryl slip knot was placed between Denonvillier's fascia, the posterior bladder neck, and the posterior urethra to reapproximate these structures. A double-armed 3-0 Monocryl suture was then used to perform a 360 running tension-free anastomosis between the bladder neck and urethra. A new urethral catheter was then placed into the bladder and irrigated. There were no blood clots within the bladder and the anastomosis appeared to be watertight. A #19 Blake drain  was then brought through the left lateral 8 mm port site and positioned appropriately within the pelvis. It was secured to the skin with a nylon suture. The surgical cart was then undocked. The right lateral 12 mm port site was closed at the fascial level with a 0 Vicryl suture placed laparoscopically. All remaining ports were then removed under direct vision. The prostate specimen was removed intact within the Endopouch retrieval bag via the periumbilical camera port site. This fascial opening was closed with two running 0 PDS sutures. 0.25% Marcaine was then injected into all port sites and all incisions were reapproximated at the skin level with 4-0 Monocryl subcuticular sutures and Dermabond. The patient appeared to tolerate the procedure well and without complications. The patient was able to be extubated and transferred to the recovery unit in satisfactory condition.   Pryor Curia MD

## 2021-11-14 NOTE — Progress Notes (Signed)
Patient ID: Erik Gill, male   DOB: 11/01/1961, 60 y.o.   MRN: [id number]  Post-op note  Subjective: The patient is doing well.  No complaints.  Objective: Vital signs in last 24 hours: Temp:  [98.4 F (36.9 C)-99.6 F (37.6 C)] 98.4 F (36.9 C) (12/12 1525) Pulse Rate:  [76-88] 76 (12/12 1545) Resp:  [2-19] 19 (12/12 1545) BP: (167-174)/(88-99) 169/92 (12/12 1545) SpO2:  [98 %-100 %] 99 % (12/12 1545)  Intake/Output from previous day: No intake/output data recorded. Intake/Output this shift: Total I/O In: 2650 [I.V.:2300; IV Piggyback:350] Out: 150 [Blood:150]  Physical Exam:  General: Alert and oriented. Abdomen: Soft, Nondistended. Incisions: Clean and dry. GU: Urine clear.  Lab Results: No results for input(s): HGB, HCT in the last 72 hours.  Assessment/Plan: POD#0   1) Continue to monitor, ambulate, IS   Pryor Curia. MD   LOS: 0 days   Dutch Gray 11/14/2021, 3:55 PM

## 2021-11-15 ENCOUNTER — Encounter (HOSPITAL_COMMUNITY): Payer: Self-pay | Admitting: Urology

## 2021-11-15 ENCOUNTER — Other Ambulatory Visit: Payer: Self-pay

## 2021-11-15 DIAGNOSIS — C61 Malignant neoplasm of prostate: Secondary | ICD-10-CM | POA: Diagnosis not present

## 2021-11-15 LAB — HEMOGLOBIN AND HEMATOCRIT, BLOOD
HCT: 33.7 % — ABNORMAL LOW (ref 39.0–52.0)
HCT: 36.3 % — ABNORMAL LOW (ref 39.0–52.0)
Hemoglobin: 11.1 g/dL — ABNORMAL LOW (ref 13.0–17.0)
Hemoglobin: 11.9 g/dL — ABNORMAL LOW (ref 13.0–17.0)

## 2021-11-15 MED ORDER — TRAMADOL HCL 50 MG PO TABS
50.0000 mg | ORAL_TABLET | Freq: Four times a day (QID) | ORAL | Status: DC | PRN
  Administered 2021-11-15: 100 mg via ORAL
  Filled 2021-11-15: qty 2

## 2021-11-15 MED ORDER — CHLORHEXIDINE GLUCONATE CLOTH 2 % EX PADS
6.0000 | MEDICATED_PAD | Freq: Every day | CUTANEOUS | Status: DC
  Administered 2021-11-15: 6 via TOPICAL

## 2021-11-15 MED ORDER — BISACODYL 10 MG RE SUPP
10.0000 mg | Freq: Once | RECTAL | Status: DC
  Filled 2021-11-15: qty 1

## 2021-11-15 NOTE — Progress Notes (Signed)
Patient ID: Erik Gill, male   DOB: 1961-04-27, 60 y.o.   MRN: [id number]  1 Day Post-Op Subjective: The patient is doing well.  No nausea or vomiting. Pain is adequately controlled.  Objective: Vital signs in last 24 hours: Temp:  [97.6 F (36.4 C)-99.6 F (37.6 C)] 98.7 F (37.1 C) (12/13 0530) Pulse Rate:  [76-88] 81 (12/13 0530) Resp:  [2-20] 19 (12/13 0530) BP: (153-174)/(82-99) 156/82 (12/13 0530) SpO2:  [96 %-100 %] 96 % (12/13 0530) Weight:  [96.6 kg] 96.6 kg (12/12 1803)  Intake/Output from previous day: 12/12 0701 - 12/13 0700 In: 5406.5 [P.O.:360; I.V.:4596.5; IV Piggyback:450] Out: 3820 [Urine:3475; Drains:195; Blood:150] Intake/Output this shift: No intake/output data recorded.  Physical Exam:  General: Alert and oriented. CV: RRR Lungs: Clear bilaterally. GI: Soft, Nondistended. Incisions: Clean, dry, and intact Urine: Clear Extremities: Nontender, no erythema, no edema.  Lab Results: Recent Labs    11/14/21 1702 11/15/21 0456  HGB 13.0 11.1*  HCT 40.8 33.7*      Assessment/Plan: POD# 1 s/p robotic prostatectomy.  1) SL IVF 2) Ambulate, Incentive spirometry 3) Transition to oral pain medication 4) Dulcolax suppository 5) D/C pelvic drain 6) Plan for likely discharge later today   Erik Gill. MD   LOS: 0 days   Erik Gill 11/15/2021, 7:39 AM

## 2021-11-15 NOTE — Discharge Summary (Signed)
Date of admission: 11/14/2021  Date of discharge: 11/15/2021  Admission diagnosis: prostate cancer   Discharge diagnosis: prostate cancer   Secondary diagnoses:  Patient Active Problem List   Diagnosis Date Noted   Prostate cancer ([place]) 11/14/2021   Elevated PSA 11/05/2021   Malignant neoplasm of prostate ([place]) 10/08/2017   Sinusitis, chronic 07/15/2016   Smoker 07/15/2016   Essential hypertension 07/15/2016    Procedures performed: Procedure(s): XI ROBOTIC ASSISTED LAPAROSCOPIC RADICAL PROSTATECTOMY LEVEL 1 LYMPHADENECTOMY, PELVIC  History and Physical: For full details, please see admission history and physical. Briefly, Erik Gill is a 60 y.o. year old patient with clinically localized prostate cancer who presented for planned outpatient robotic prostatectomy with lymph node dissection.   Hospital Course: Patient tolerated the procedure well.  He was then transferred to the floor after an uneventful PACU stay.  His hospital course was uncomplicated. He walked overnight POD0 without issues. In the morning he was able to ambulate independently with pain well controlled. He tolerated clears without nausea or vomiting. His JP was removed prior to discharge. He had robust urine output from the foley, was light pink tinged. His Hgb on POD1 am labs had dropped to 11.1 from 13.0 so this was rechecked in the afternoon and was stable at 11.9. Suspect the drop was dilutional.  On POD#1 he had met discharge criteria: was tolerating clears, was up and ambulating independently,  pain was well controlled, catheter was draining without issues, and was ready to for discharge.   Laboratory values:  Recent Labs    11/14/21 1702 11/15/21 0456 11/15/21 1049  HGB 13.0 11.1* 11.9*  HCT 40.8 33.7* 36.3*   No results for input(s): NA, K, CL, CO2, GLUCOSE, BUN, CREATININE, CALCIUM in the last 72 hours. No results for input(s): LABPT, INR in the last 72 hours. No results for input(s): LABURIN in  the last 72 hours. Results for orders placed or performed in visit on 11/10/21  SARS Coronavirus 2 (TAT 6-24 hrs)     Status: None   Collection Time: 11/10/21 12:00 AM  Result Value Ref Range Status   SARS Coronavirus 2 RESULT: NEGATIVE  Final    Comment: RESULT: NEGATIVESARS-CoV-2 INTERPRETATION:A NEGATIVE  test result means that SARS-CoV-2 RNA was not present in the specimen above the limit of detection of this test. This does not preclude a possible SARS-CoV-2 infection and should not be used as the  sole basis for patient management decisions. Negative results must be combined with clinical observations, patient history, and epidemiological information. Optimum specimen types and timing for peak viral levels during infections caused by SARS-CoV-2  have not been determined. Collection of multiple specimens or types of specimens may be necessary to detect virus. Improper specimen collection and handling, sequence variability under primers/probes, or organism present below the limit of detection may  lead to false negative results. Positive and negative predictive values of testing are highly dependent on prevalence. False negative test results are more likely when prevalence of disease is high.The expected result is NEGATIVE.Fact S heet for  Healthcare Providers: LocalChronicle.no Sheet for Patients: SalonLookup.es Reference Range - Negative     Disposition: Home  Discharge instruction: The patient was instructed to be ambulatory but told to refrain from heavy lifting, strenuous activity, or driving.   Discharge medications:  Allergies as of 11/15/2021   No Known Allergies      Medication List     STOP taking these medications    aspirin EC 81 MG tablet   BC  FAST PAIN RELIEF PO   montelukast 10 MG tablet Commonly known as: SINGULAIR   predniSONE 10 MG tablet Commonly known as: DELTASONE       TAKE these  medications    amLODipine-benazepril 10-40 MG capsule Commonly known as: LOTREL Take 1 capsule by mouth daily.   cetirizine 10 MG tablet Commonly known as: ZYRTEC Take 10 mg by mouth daily as needed for allergies.   docusate sodium 100 MG capsule Commonly known as: COLACE Take 1 capsule (100 mg total) by mouth 2 (two) times daily.   fluticasone 50 MCG/ACT nasal spray Commonly known as: FLONASE USE 2 SPRAYS INTO BOTH NOSTRILS DAILY What changed: See the new instructions.   metoprolol succinate 25 MG 24 hr tablet Commonly known as: TOPROL-XL Take 25 mg by mouth daily.   Opcon-A 0.027-0.315 % Soln Generic drug: Naphazoline-Pheniramine Place 1 drop into both eyes daily as needed (allergies).   sulfamethoxazole-trimethoprim 800-160 MG tablet Commonly known as: BACTRIM DS Take 1 tablet by mouth 2 (two) times daily. Start the day prior to foley removal appointment   traMADol 50 MG tablet Commonly known as: Ultram Take 1-2 tablets (50-100 mg total) by mouth every 6 (six) hours as needed for moderate pain or severe pain.   triamcinolone cream 0.1 % Commonly known as: KENALOG Apply 1 application topically 2 (two) times daily. To affected area(s) as needed What changed:  when to take this reasons to take this additional instructions        Followup:   Follow-up Information     Raynelle Bring, MD Follow up on 11/23/2021.   Specialty: Urology Why: at 12:45 Contact information: [place] [address] [phone]

## 2021-11-15 NOTE — Progress Notes (Signed)
Patient discharging home. Vital signs stable at time of discharge as reflected in discharge summary. Discharge instructions given and verbal understanding returned. Patient discharging with large foley bag and leg foley bags. No questions at this time. Follow up appointment scheduled.

## 2021-11-17 LAB — SURGICAL PATHOLOGY

## 2021-12-08 ENCOUNTER — Other Ambulatory Visit: Payer: Self-pay

## 2021-12-08 ENCOUNTER — Encounter: Payer: Self-pay | Admitting: Physical Therapy

## 2021-12-08 ENCOUNTER — Ambulatory Visit: Payer: 59 | Attending: Urology | Admitting: Physical Therapy

## 2021-12-08 DIAGNOSIS — M6281 Muscle weakness (generalized): Secondary | ICD-10-CM | POA: Diagnosis not present

## 2021-12-08 NOTE — Therapy (Signed)
[facility] @ [place] [place], [place], [address] Phone: [phone]   Fax:  [phone]  Physical Therapy Treatment  Patient Details  Name: Erik Gill MRN: [id number] Date of Birth: 1961-07-20 Referring Provider (PT): Raynelle Bring, MD   Encounter Date: 12/08/2021   PT End of Session - 12/08/21 1002     Visit Number 2    Date for PT Re-Evaluation 01/26/22    Authorization Type [employer]    PT Start Time 0931    PT Stop Time 1010    PT Time Calculation (min) 39 min    Activity Tolerance Patient tolerated treatment well    Behavior During Therapy [facility] for tasks assessed/performed             Past Medical History:  Diagnosis Date   Elevated PSA    GERD (gastroesophageal reflux disease)    Headache    sinus related   Hypertension    Nocturia    Prostate cancer ([place])    Wears glasses     Past Surgical History:  Procedure Laterality Date   HERNIA REPAIR Right    LYMPHADENECTOMY Bilateral 11/14/2021   Procedure: LYMPHADENECTOMY, PELVIC;  Surgeon: Raynelle Bring, MD;  Location: [facility];  Service: Urology;  Laterality: Bilateral;   PROSTATE BIOPSY N/A 08/29/2017   Procedure: BIOPSY TRANSRECTAL ULTRASONIC PROSTATE (TUBP);  Surgeon: Ceasar Mons, MD;  Location[facility];  Service: Urology;  Laterality: N/A;   ROBOT ASSISTED LAPAROSCOPIC RADICAL PROSTATECTOMY N/A 11/14/2021   Procedure: XI ROBOTIC ASSISTED LAPAROSCOPIC RADICAL PROSTATECTOMY LEVEL 1;  Surgeon: Raynelle Bring, MD;  Location: [facility];  Service: Urology;  Laterality: N/A;    There were no vitals filed for this visit.   Subjective Assessment - 12/08/21 0935     Subjective Pt has been wearing pull up and leaks a little during the day. It takes time to have a BM.  Overall doing okay, using 2-3 pullup per day. I hold my stomach when I cough or sneeze but no pain most of the time.    Currently in Pain? No/denies                                [facility] Adult PT Treatment/Exercise - 12/08/21 0001       Neuro Re-ed    Neuro Re-ed Details  breathing and bulging with all stretches and how to activate transversus abdominus      Exercises   Exercises Lumbar      Lumbar Exercises: Stretches   Active Hamstring Stretch 1 rep    Single Knee to Chest Stretch 3 reps;10 seconds    Other Lumbar Stretch Exercise butterfly and hip rotation - 5 x 10 sec      Lumbar Exercises: Quadruped   [place] Horse 10 reps   5 sec with breathing   Plank qped with engaging the core    Other Quadruped Lumbar Exercises child pose and side bend in child pose breathing into side                       PT Short Term Goals - 11/03/21 1233       PT SHORT TERM GOAL #1   Title ind with initial exercise plan for post prostatectomy    Time 1    Period Days    Status Achieved    Target Date 11/03/21  PT Long Term Goals - 12/08/21 1120       PT LONG TERM GOAL #1   Title Pt will be ind with advanced HEP to return to work related activities without leakage    Status On-going      PT LONG TERM GOAL #2   Title Pt will be knowledgeable on how to manage pain post surgery and not have pain limiting any normal activities    Status On-going                   Plan - 12/08/21 1121     Clinical Impression Statement Pt is post op prostatectomy and having leakage consistently at this time . He normally works night shift and is up all night going to the bathroom and getting more leakage at night.  Pt has a lot of tension and feels the exercises are not working yet.  Pt was educated on focusing more on relaxing and stretching at this time as he is walking a lot and still very soon out of surgery. Pt will benefit from skilled PT to continue to work on improved soft tissue function of the pelvic floor and core strength    PT Treatment/Interventions ADLs/Self Care Home  Management;Biofeedback;Cryotherapy;Electrical Stimulation;Moist Heat;Therapeutic activities;Therapeutic exercise;Neuromuscular re-education;Taping;Dry needling;Passive range of motion;Patient/family education;Manual techniques    PT Next Visit Plan progress core strength and spinal mobility rotation on wall    Consulted and Agree with Plan of Care Patient             Patient will benefit from skilled therapeutic intervention in order to improve the following deficits and impairments:  Postural dysfunction, Impaired flexibility, Decreased strength, Decreased coordination  Visit Diagnosis: Muscle weakness (generalized)     Problem List Patient Active Problem List   Diagnosis Date Noted   Prostate cancer ([place]) 11/14/2021   Elevated PSA 11/05/2021   Malignant neoplasm of prostate ([place]) 10/08/2017   Sinusitis, chronic 07/15/2016   Smoker 07/15/2016   Essential hypertension 07/15/2016    Erik Gill, PT 12/08/2021, 11:33 AM  [facility] @ [address] [place], [place], [address] Phone: [phone]   Fax:  [phone]  Name: Erik Gill MRN: [id number] Date of Birth: [birth year]

## 2021-12-14 ENCOUNTER — Ambulatory Visit: Payer: 59 | Admitting: Physical Therapy

## 2021-12-23 ENCOUNTER — Encounter: Payer: Self-pay | Admitting: Physical Therapy

## 2021-12-23 ENCOUNTER — Other Ambulatory Visit: Payer: Self-pay

## 2021-12-23 ENCOUNTER — Ambulatory Visit: Payer: 59 | Admitting: Physical Therapy

## 2021-12-23 DIAGNOSIS — M6281 Muscle weakness (generalized): Secondary | ICD-10-CM | POA: Diagnosis not present

## 2021-12-23 NOTE — Therapy (Signed)
[facility] @ [address] [place], [place], [address] Phone: [phone]   Fax:  [phone]  Physical Therapy Treatment  Patient Details  Name: Erik Gill MRN: [id number] Date of Birth: 12-16-1960 Referring Provider (PT): Raynelle Bring, MD   Encounter Date: 12/23/2021   PT End of Session - 12/23/21 0802     Visit Number 3    Date for PT Re-Evaluation 01/26/22    Authorization Type UHC    PT Start Time 0759    PT Stop Time 0838    PT Time Calculation (min) 39 min    Activity Tolerance Patient tolerated treatment well    Behavior During Therapy [facility] for tasks assessed/performed             Past Medical History:  Diagnosis Date   Elevated PSA    GERD (gastroesophageal reflux disease)    Headache    sinus related   Hypertension    Nocturia    Prostate cancer ([place])    Wears glasses     Past Surgical History:  Procedure Laterality Date   HERNIA REPAIR Right    LYMPHADENECTOMY Bilateral 11/14/2021   Procedure: LYMPHADENECTOMY, PELVIC;  Surgeon: Raynelle Bring, MD;  Location: [facility];  Service: Urology;  Laterality: Bilateral;   PROSTATE BIOPSY N/A 08/29/2017   Procedure: BIOPSY TRANSRECTAL ULTRASONIC PROSTATE (TUBP);  Surgeon: Ceasar Mons, MD;  Location: [facility];  Service: Urology;  Laterality: N/A;   ROBOT ASSISTED LAPAROSCOPIC RADICAL PROSTATECTOMY N/A 11/14/2021   Procedure: XI ROBOTIC ASSISTED LAPAROSCOPIC RADICAL PROSTATECTOMY LEVEL 1;  Surgeon: Raynelle Bring, MD;  Location: [facility];  Service: Urology;  Laterality: N/A;    There were no vitals filed for this visit.   Subjective Assessment - 12/23/21 0801     Subjective I don't leak as much.  I go through 2 pull ups.  I am doing the exercise and going on you tube.    Currently in Pain? No/denies                               [facility] Adult PT Treatment/Exercise - 12/23/21 0001       Neuro Re-ed     Neuro Re-ed Details  breathing and working on exhale with exertion, - still needing mod-max cues      Lumbar Exercises: Stretches   Active Hamstring Stretch Right;Left;2 reps;30 seconds    Other Lumbar Stretch Exercise thoracic rotation      Lumbar Exercises: Standing   Lifting 10 reps    Lifting Weights (lbs) 3    Lifting Limitations 3 sets with different foot positions    Other Standing Lumbar Exercises mini squat - breathing and kegels - correcting form      Lumbar Exercises: Seated   Other Seated Lumbar Exercises row machine - 20 lb - 20x      Lumbar Exercises: Quadruped   Other Quadruped Lumbar Exercises leaning on table                     PT Education - 12/23/21 0841     Education Details Access Code: [email or web address]    Person(s) Educated Patient    Methods Explanation;Demonstration;Verbal cues;Tactile cues    Comprehension Verbalized understanding;Returned demonstration              PT Short Term Goals - 11/03/21 1233       PT SHORT TERM GOAL #  1   Title ind with initial exercise plan for post prostatectomy    Time 1    Period Days    Status Achieved    Target Date 11/03/21               PT Long Term Goals - 12/23/21 0847       PT LONG TERM GOAL #1   Title Pt will be ind with advanced HEP to return to work related activities without leakage    Status On-going      PT LONG TERM GOAL #2   Title Pt will be knowledgeable on how to manage pain post surgery and not have pain limiting any normal activities    Status On-going                   Plan - 12/23/21 0846     Clinical Impression Statement Pt is doing well with HEP.  He is still needing a moderate to max amount of cueing for breathing correctly.  Pt able to porgress exercises today. Pt will benefit from skilled PT  to continue to progress goals per POC.    PT Treatment/Interventions ADLs/Self Care Home Management;Biofeedback;Cryotherapy;Electrical Stimulation;Moist Heat;Therapeutic  activities;Therapeutic exercise;Neuromuscular re-education;Taping;Dry needling;Passive range of motion;Patient/family education;Manual techniques    PT Next Visit Plan progress core strength and exhale with exertion continue to progress and follow up with squats add hip hinge    PT Home Exercise Plan [email or web address]    Consulted and Agree with Plan of Care Patient             Patient will benefit from skilled therapeutic intervention in order to improve the following deficits and impairments:  Postural dysfunction, Impaired flexibility, Decreased strength, Decreased coordination  Visit Diagnosis: Muscle weakness (generalized)     Problem List Patient Active Problem List   Diagnosis Date Noted   Prostate cancer ([place]) 11/14/2021   Elevated PSA 11/05/2021   Malignant neoplasm of prostate ([place]) 10/08/2017   Sinusitis, chronic 07/15/2016   Smoker 07/15/2016   Essential hypertension 07/15/2016    Erik Gill, PT 12/23/2021, 8:48 AM  [facility] @ [address] [place], [place], [address] Phone: [phone]   Fax:  [phone]  Name: Erik Gill MRN: [id number] Date of Birth: August 04, 1961

## 2021-12-23 NOTE — Patient Instructions (Signed)
Access Code: [email or web address] URL: [email or web address] Date: 12/23/2021 Prepared by: Jari Favre  Exercises Hooklying Small March - 1 x daily - 7 x weekly - 2 sets - 10 reps Supine Hip Adductor Squeeze with Small Ball - 3 x daily - 7 x weekly - 1 sets - 10 reps - 3 sec hold Supine Bridge with Mini Swiss Ball Between Knees - 1 x daily - 7 x weekly - 3 sets - 10 reps Supine Gluteal Sets - 3 x daily - 7 x weekly - 1 sets - 10 reps - 5 sec hold Supine Transversus Abdominis Bracing with Pelvic Floor Contraction - 1 x daily - 7 x weekly - 1 sets - 15 reps - 3 sec hold Supine Hamstring Stretch with Strap - 1 x daily - 7 x weekly - 1 sets - 3 reps - 30 sec hold Supine Figure 4 Piriformis Stretch - 1 x daily - 7 x weekly - 1 sets - 3 reps - 30 sec hold Supine Butterfly Groin Stretch - 1 x daily - 7 x weekly - 3 sets - 10 reps Supine Hip Internal and External Rotation - 1 x daily - 7 x weekly - 1 sets - 10 reps - 5 sec hold Cat Cow - 1 x daily - 7 x weekly - 3 sets - 10 reps Quadruped Transversus Abdominis Bracing - 1 x daily - 7 x weekly - 2 sets - 10 reps - 5 sec hold Child's Pose with Sidebending - 1 x daily - 7 x weekly - 1 sets - 3 reps - 30 sec hold Standing Low Back Flexion at Table - 3 x daily - 7 x weekly - 1 sets - 10 reps Seated Hamstring Stretch - 1 x daily - 7 x weekly - 3 reps - 1 sets - 30 sec hold

## 2021-12-28 ENCOUNTER — Ambulatory Visit: Payer: 59 | Admitting: Physical Therapy

## 2022-01-04 ENCOUNTER — Ambulatory Visit: Payer: 59 | Attending: Urology | Admitting: Physical Therapy

## 2022-01-04 ENCOUNTER — Telehealth: Payer: Self-pay | Admitting: Physical Therapy

## 2022-01-04 DIAGNOSIS — M6281 Muscle weakness (generalized): Secondary | ICD-10-CM | POA: Insufficient documentation

## 2022-01-04 NOTE — Telephone Encounter (Signed)
Patient did not show for appointment.  Patient was called and PT left message to please call us back.  Gustavus Bryant, PT 01/04/22 11:43 AM

## 2022-01-11 ENCOUNTER — Other Ambulatory Visit: Payer: Self-pay

## 2022-01-11 ENCOUNTER — Encounter (HOSPITAL_COMMUNITY): Payer: Self-pay | Admitting: Emergency Medicine

## 2022-01-11 ENCOUNTER — Encounter: Payer: Managed Care, Other (non HMO) | Admitting: Physical Therapy

## 2022-01-11 ENCOUNTER — Ambulatory Visit (HOSPITAL_COMMUNITY)
Admission: EM | Admit: 2022-01-11 | Discharge: 2022-01-11 | Disposition: A | Discharge status: Home / Self Care | Payer: 59 | Attending: Emergency Medicine | Admitting: Emergency Medicine

## 2022-01-11 DIAGNOSIS — J0110 Acute frontal sinusitis, unspecified: Secondary | ICD-10-CM | POA: Diagnosis not present

## 2022-01-11 DIAGNOSIS — J0100 Acute maxillary sinusitis, unspecified: Secondary | ICD-10-CM | POA: Diagnosis not present

## 2022-01-11 DIAGNOSIS — J069 Acute upper respiratory infection, unspecified: Secondary | ICD-10-CM

## 2022-01-11 MED ORDER — PREDNISONE 10 MG (21) PO TBPK: ORAL_TABLET | Freq: Every day | ORAL | 0 refills | Status: DC

## 2022-01-11 MED ORDER — AZITHROMYCIN 250 MG PO TABS: 250.0000 mg | ORAL_TABLET | Freq: Every day | ORAL | 0 refills | Status: DC

## 2022-01-11 MED ORDER — FLUTICASONE PROPIONATE 50 MCG/ACT NA SUSP: 1.0000 | Freq: Every day | NASAL | 2 refills | Status: DC

## 2022-01-11 NOTE — ED Triage Notes (Signed)
Since Monday having congestion, cough and itchy throat. Reports was around someone sick at work.

## 2022-01-11 NOTE — ED Provider Notes (Signed)
[name]    CSN: [id number] Arrival date & time: 01/11/22  0539      History   Chief Complaint Chief Complaint  Patient presents with   Sore Throat   Cough   Nasal Congestion    HPI Erik Gill is a 61 y.o. male.   Patient is here for sore throat cough congestion since Monday when a coworker had the same illness.  Patient states that is getting worse causing right-sided facial pain.  Denies any nausea vomiting diarrhea no known fever.  States he has been taking a lot of over-the-counter cold and cough medicine which is caused his blood pressure to become elevated.  Has tried nasal flushes which is helped very minimally.   Past Medical History:  Diagnosis Date   Elevated PSA    GERD (gastroesophageal reflux disease)    Headache    sinus related   Hypertension    Nocturia    Prostate cancer [facility])    Wears glasses     Patient Active Problem List   Diagnosis Date Noted   Prostate cancer ([place]) 11/14/2021   Elevated PSA 11/05/2021   Malignant neoplasm of prostate ([place]) 10/08/2017   Sinusitis, chronic 07/15/2016   Smoker 07/15/2016   Essential hypertension 07/15/2016    Past Surgical History:  Procedure Laterality Date   HERNIA REPAIR Right    LYMPHADENECTOMY Bilateral 11/14/2021   Procedure: LYMPHADENECTOMY, PELVIC;  Surgeon: Raynelle Bring, MD;  Location: [facility];  Service: Urology;  Laterality: Bilateral;   PROSTATE BIOPSY N/A 08/29/2017   Procedure: BIOPSY TRANSRECTAL ULTRASONIC PROSTATE (TUBP);  Surgeon: Ceasar Mons, MD;  Location[facility];  Service: Urology;  Laterality: N/A;   ROBOT ASSISTED LAPAROSCOPIC RADICAL PROSTATECTOMY N/A 11/14/2021   Procedure: XI ROBOTIC ASSISTED LAPAROSCOPIC RADICAL PROSTATECTOMY LEVEL 1;  Surgeon: Raynelle Bring, MD;  Location[facility];  Service: Urology;  Laterality: N/A;       Home Medications    Prior to Admission medications   Medication Sig Start Date End Date Taking?  Authorizing Provider  amLODipine-benazepril (LOTREL) 10-40 MG capsule Take 1 capsule by mouth daily. 09/28/16   [provider]  cetirizine (ZYRTEC) 10 MG tablet Take 10 mg by mouth daily as needed for allergies.    [provider]  docusate sodium (COLACE) 100 MG capsule Take 1 capsule (100 mg total) by mouth 2 (two) times daily. 11/14/21   Debbrah Alar, PA-C  fluticasone (FLONASE) 50 MCG/ACT nasal spray USE 2 SPRAYS INTO BOTH NOSTRILS DAILY Patient taking differently: 2 sprays daily as needed for allergies. 07/10/17   Jaynee Eagles, PA-C  metoprolol succinate (TOPROL-XL) 25 MG 24 hr tablet Take 25 mg by mouth daily.    [provider]  Naphazoline-Pheniramine (OPCON-A) 0.027-0.315 % SOLN Place 1 drop into both eyes daily as needed (allergies).    [provider]  sulfamethoxazole-trimethoprim (BACTRIM DS) 800-160 MG tablet Take 1 tablet by mouth 2 (two) times daily. Start the day prior to foley removal appointment 11/14/21   Debbrah Alar, PA-C  traMADol (ULTRAM) 50 MG tablet Take 1-2 tablets (50-100 mg total) by mouth every 6 (six) hours as needed for moderate pain or severe pain. 11/14/21   Debbrah Alar, PA-C  triamcinolone cream (KENALOG) 0.1 % Apply 1 application topically 2 (two) times daily. To affected area(s) as needed Patient taking differently: Apply 1 application topically 2 (two) times daily as needed (rash). 01/18/19   Emeterio Reeve, DO    Family History Family History  Problem  Relation Age of Onset   Cancer Father        prostate   Cancer Brother        prostate   Cancer Brother        prostate   Cancer Cousin        prostate cancer/first cousin    Social History Social History   Tobacco Use   Smoking status: Every Day    Packs/day: 0.50    Years: 1.00    Pack years: 0.50    Types: Cigarettes   Smokeless tobacco: Never   Tobacco comments:    0.5 PP2D  Vaping Use   Vaping Use: Never used  Substance Use Topics   Alcohol  use: Not Currently    Comment: OCCASIONAL   Drug use: No     Allergies   Patient has no known allergies.   Review of Systems Review of Systems  Constitutional:  Positive for fatigue. Negative for fever.  HENT:  Positive for congestion, facial swelling, postnasal drip, rhinorrhea, sinus pressure, sinus pain, sneezing and sore throat.   Respiratory:  Positive for cough. Negative for shortness of breath.   Cardiovascular: Negative.   Gastrointestinal: Negative.  Negative for abdominal pain, nausea and vomiting.  Genitourinary: Negative.   Skin: Negative.   Neurological: Negative.     Physical Exam Triage Vital Signs ED Triage Vitals  Enc Vitals Group     BP 01/11/22 1018 (!) 181/85     Pulse Rate 01/11/22 1018 93     Resp 01/11/22 1018 20     Temp 01/11/22 1018 98.5 F (36.9 C)     Temp Source 01/11/22 1018 Oral     SpO2 01/11/22 1018 100 %     Weight --      Height --      Head Circumference --      Peak Flow --      Pain Score 01/11/22 1017 0     Pain Loc --      Pain Edu? --      Excl. in [place]? --    No data found.  Updated Vital Signs BP (!) 181/85 (BP Location: Right Arm)    Pulse 93    Temp 98.5 F (36.9 C) (Oral)    Resp 20    SpO2 100%   Visual Acuity Right Eye Distance:   Left Eye Distance:   Bilateral Distance:    Right Eye Near:   Left Eye Near:    Bilateral Near:     Physical Exam Constitutional:      Appearance: He is well-developed. He is obese.  HENT:     Right Ear: Tympanic membrane normal.     Left Ear: Tympanic membrane normal.     Nose: Congestion present.     Right Turbinates: Enlarged and swollen.     Left Turbinates: Enlarged and swollen.     Right Sinus: Frontal sinus tenderness present.     Mouth/Throat:     Pharynx: Posterior oropharyngeal erythema present. No pharyngeal swelling, oropharyngeal exudate or uvula swelling.     Tonsils: 1+ on the right. 1+ on the left.  Neurological:     Mental Status: He is alert.     UC  Treatments / Results  Labs (all labs ordered are listed, but only abnormal results are displayed) Labs Reviewed - No data to display  EKG   Radiology No results found.  Procedures Procedures (including critical care time)  Medications Ordered in UC Medications -  No data to display  Initial Impression / Assessment and Plan / UC Course  I have reviewed the triage vital signs and the nursing notes.  Pertinent labs & imaging results that were available during my care of the patient were reviewed by me and considered in my medical decision making (see chart for details).     Avoid taking decongestion over-the-counter medications as this can cause elevation in the blood pressure. Continue to stay hydrated drink plenty of water Take Tylenol or Motrin as needed for pain or fever You can continue to use the saline flushes as needed  Final Clinical Impressions(s) / UC Diagnoses   Final diagnoses:  None   Discharge Instructions   None    ED Prescriptions   None    PDMP not reviewed this encounter.   Marney Setting, NP 01/11/22 1043

## 2022-01-18 ENCOUNTER — Other Ambulatory Visit: Payer: Self-pay

## 2022-01-18 ENCOUNTER — Encounter: Payer: Self-pay | Admitting: Physical Therapy

## 2022-01-18 ENCOUNTER — Ambulatory Visit: Payer: 59 | Admitting: Physical Therapy

## 2022-01-18 DIAGNOSIS — M6281 Muscle weakness (generalized): Secondary | ICD-10-CM | POA: Diagnosis not present

## 2022-01-18 NOTE — Therapy (Signed)
[facility] @ [address] [place], [place], [address] Phone: [phone]   Fax:  [phone]  Physical Therapy Treatment  Patient Details  Name: Erik Gill MRN: [id number] Date of Birth: 1961/03/25 Referring Provider (PT): Raynelle Bring, MD   Encounter Date: 01/18/2022   PT End of Session - 01/18/22 1105     Visit Number 4    Date for PT Re-Evaluation 01/26/22    Authorization Type UHC    PT Start Time 1102    PT Stop Time 1142    PT Time Calculation (min) 40 min    Activity Tolerance Patient tolerated treatment well    Behavior During Therapy [facility] for tasks assessed/performed             Past Medical History:  Diagnosis Date   Elevated PSA    GERD (gastroesophageal reflux disease)    Headache    sinus related   Hypertension    Nocturia    Prostate cancer ([place])    Wears glasses     Past Surgical History:  Procedure Laterality Date   HERNIA REPAIR Right    LYMPHADENECTOMY Bilateral 11/14/2021   Procedure: LYMPHADENECTOMY, PELVIC;  Surgeon: Raynelle Bring, MD;  [facility];  Service: Urology;  Laterality: Bilateral;   PROSTATE BIOPSY N/A 08/29/2017   Procedure: BIOPSY TRANSRECTAL ULTRASONIC PROSTATE (TUBP);  Surgeon: Ceasar Mons, MD;  Location: [facility];  Service: Urology;  Laterality: N/A;   ROBOT ASSISTED LAPAROSCOPIC RADICAL PROSTATECTOMY N/A 11/14/2021   Procedure: XI ROBOTIC ASSISTED LAPAROSCOPIC RADICAL PROSTATECTOMY LEVEL 1;  Surgeon: Raynelle Bring, MD;  Location[facility];  Service: Urology;  Laterality: N/A;    There were no vitals filed for this visit.   Subjective Assessment - 01/18/22 1106     Subjective I am back at work and it is a little better.  I noticed the leakage comes and goes.  It go through 2 pull ups.  I was sick and a lot of congestion not coughing a lot    Currently in Pain? No/denies                            Pelvic  Floor Special Questions - 01/18/22 0001     External Palpation holding 6 sec               [facility] Adult PT Treatment/Exercise - 01/18/22 0001       Neuro Re-ed    Neuro Re-ed Details  educated and performed knack staggered stance      Lumbar Exercises: Stretches   Other Lumbar Stretch Exercise hip rotation with press LE into active IR      Lumbar Exercises: Standing   Functional Squats 10 reps    Functional Squats Limitations holding 4lb in each hand, regular and sumo squat positions    Lifting 10 reps    Lifting Weights (lbs) 4    Wall Slides 10 reps    Wall Slides Limitations lifting 4 lb, yoga block between knees      Lumbar Exercises: Supine   Bridge 10 reps    Bridge with March 10 reps;2 seconds                     PT Education - 01/18/22 1148     Education Details Access Code: [email or web address]    Person(s) Educated Patient    Methods Explanation;Demonstration;Tactile cues;Verbal cues;Handout  Comprehension Verbalized understanding;Returned demonstration              PT Short Term Goals - 11/03/21 1233       PT SHORT TERM GOAL #1   Title ind with initial exercise plan for post prostatectomy    Time 1    Period Days    Status Achieved    Target Date 11/03/21               PT Long Term Goals - 01/18/22 1108       PT LONG TERM GOAL #1   Title Pt will be ind with advanced HEP to return to work related activities without leakage    Status On-going      PT LONG TERM GOAL #2   Title Pt will be knowledgeable on how to manage pain post surgery and not have pain limiting any normal activities    Status Achieved      PT LONG TERM GOAL #3   Title Pt will report no leakage with coughing or squatting so he can wear regular underwear.    Baseline have leakage when coughing, sneezing, squatting                   Plan - 01/18/22 1148     Clinical Impression Statement Pt was assessed outside clothing to address kegel endurance.  Pt is  able to hold for 6 seconds.  Pt was educated in the knack and exhale with exertion.  He is still needing some cues to breathe during the exercise but this is looking much better. Pt was educated in lifting techniques.  Pt still having some leakage so working on going into range of motion that he can sense the muscles are remaining active.  Pt will benefit from skilled PT to successfully transition home with HEP.    PT Treatment/Interventions ADLs/Self Care Home Management;Biofeedback;Cryotherapy;Electrical Stimulation;Moist Heat;Therapeutic activities;Therapeutic exercise;Neuromuscular re-education;Taping;Dry needling;Passive range of motion;Patient/family education;Manual techniques    PT Next Visit Plan squats, push up and plank on knees, thoracic rotation    PT Home Exercise Plan [email or web address]    Consulted and Agree with Plan of Care Patient             Patient will benefit from skilled therapeutic intervention in order to improve the following deficits and impairments:  Postural dysfunction, Impaired flexibility, Decreased strength, Decreased coordination  Visit Diagnosis: Muscle weakness (generalized)     Problem List Patient Active Problem List   Diagnosis Date Noted   Prostate cancer ([place]) 11/14/2021   Elevated PSA 11/05/2021   Malignant neoplasm of prostate ([place]) 10/08/2017   Sinusitis, chronic 07/15/2016   Smoker 07/15/2016   Essential hypertension 07/15/2016    Camillo Flaming Jadae Steinke, PT 01/18/2022, 12:04 PM  [facility] @ [address] [place], [place], [address] Phone: [phone]   Fax:  [phone]  Name: Refujio Haymer MRN: [id number] Date of Birth: 1961/08/01

## 2022-01-18 NOTE — Patient Instructions (Signed)
Access Code: [email or web address] URL: [email or web address] Date: 01/18/2022 Prepared by: Jari Favre  Exercises Hooklying Small March - 1 x daily - 7 x weekly - 2 sets - 10 reps Supine Hip Adductor Squeeze with Small Ball - 3 x daily - 7 x weekly - 1 sets - 10 reps - 3 sec hold Supine Bridge with Mini Swiss Ball Between Knees - 1 x daily - 7 x weekly - 3 sets - 10 reps Supine Gluteal Sets - 3 x daily - 7 x weekly - 1 sets - 10 reps - 5 sec hold Supine Transversus Abdominis Bracing with Pelvic Floor Contraction - 1 x daily - 7 x weekly - 1 sets - 15 reps - 3 sec hold Supine Hamstring Stretch with Strap - 1 x daily - 7 x weekly - 1 sets - 3 reps - 30 sec hold Supine Figure 4 Piriformis Stretch - 1 x daily - 7 x weekly - 1 sets - 3 reps - 30 sec hold Supine Butterfly Groin Stretch - 1 x daily - 7 x weekly - 3 sets - 10 reps Supine Hip Internal and External Rotation - 1 x daily - 7 x weekly - 1 sets - 10 reps - 5 sec hold Cat Cow - 1 x daily - 7 x weekly - 3 sets - 10 reps Quadruped Transversus Abdominis Bracing - 1 x daily - 7 x weekly - 2 sets - 10 reps - 5 sec hold Child's Pose with Sidebending - 1 x daily - 7 x weekly - 1 sets - 3 reps - 30 sec hold Standing Low Back Flexion at Table - 3 x daily - 7 x weekly - 1 sets - 10 reps Seated Hamstring Stretch - 1 x daily - 7 x weekly - 1 sets - 3 reps - 30 sec hold Standing Shoulder Flexion to 90 Degrees with Dumbbells - 1 x daily - 7 x weekly - 3 sets - 10 reps

## 2022-01-25 ENCOUNTER — Other Ambulatory Visit: Payer: Self-pay

## 2022-01-25 ENCOUNTER — Ambulatory Visit: Payer: 59 | Admitting: Physical Therapy

## 2022-01-25 ENCOUNTER — Encounter: Payer: Self-pay | Admitting: Physical Therapy

## 2022-01-25 DIAGNOSIS — M6281 Muscle weakness (generalized): Secondary | ICD-10-CM

## 2022-01-25 NOTE — Therapy (Addendum)
[facility] @ [place] [address] [place], [place], [address] Phone: [phone]   Fax:  [phone]  Physical Therapy Treatment  Patient Details  Name: Erik Gill MRN: [id number] Date of Birth: 1961-07-29 Referring Provider (PT): Heloise Purpura, MD   Encounter Date: 01/25/2022   PT End of Session - 01/25/22 1105     Visit Number 5    Date for PT Re-Evaluation 04/19/22    Authorization Type U[employer]    PT Start Time 1102    PT Stop Time 1142    PT Time Calculation (min) 40 min    Activity Tolerance Patient tolerated treatment well    Behavior During Therapy [facility] for tasks assessed/performed             Past Medical History:  Diagnosis Date   Elevated PSA    GERD (gastroesophageal reflux disease)    Headache    sinus related   Hypertension    Nocturia    Prostate cancer (HCC)    Wears glasses     Past Surgical History:  Procedure Laterality Date   HERNIA REPAIR Right    LYMPHADENECTOMY Bilateral 11/14/2021   Procedure: LYMPHADENECTOMY, PELVIC;  Surgeon: Heloise Purpura, MD;  Location: [facility];  Service: Urology;  Laterality: Bilateral;   PROSTATE BIOPSY N/A 08/29/2017   Procedure: BIOPSY TRANSRECTAL ULTRASONIC PROSTATE (TUBP);  Surgeon: Rene Paci, MD;  Location: [facility];  Service: Urology;  Laterality: N/A;   ROBOT ASSISTED LAPAROSCOPIC RADICAL PROSTATECTOMY N/A 11/14/2021   Procedure: XI ROBOTIC ASSISTED LAPAROSCOPIC RADICAL PROSTATECTOMY LEVEL 1;  Surgeon: Heloise Purpura, MD;  Location: [facility];  Service: Urology;  Laterality: N/A;    There were no vitals filed for this visit.   Subjective Assessment - 01/25/22 1105     Subjective I was off for 2 nights resting because of a sinus problem I am having.  Yesterday and the day before I didn't have too much the last couple of days.  It comes and goes at night is the same thing.    Currently in Pain? No/denies                                [facility] Adult PT Treatment/Exercise - 01/25/22 0001       Neuro Re-ed    Neuro Re-ed Details  tactile cues to engage pelvic floor; can hold for several seconds at a time but needing extra time to relax between reps      Lumbar Exercises: Stretches   Active Hamstring Stretch Right;Left;2 reps;30 seconds    Other Lumbar Stretch Exercise hip rotation with press LE into active IR      Lumbar Exercises: Aerobic   Elliptical fwd 2 min; back 1 min      Lumbar Exercises: Standing   Functional Squats 10 reps    Other Standing Lumbar Exercises shoulder ext presses and pulses - exhale with exertion      Lumbar Exercises: Supine   Other Supine Lumbar Exercises kegel with yoga block and hip in IR and ER  variations - exhale with kegel - 15x each way                       PT Short Term Goals - 11/03/21 1233       PT SHORT TERM GOAL #1   Title ind with initial exercise plan for post prostatectomy  Time 1    Period Days    Status Achieved    Target Date 11/03/21               PT Long Term Goals - 01/25/22 1703       PT LONG TERM GOAL #1   Title Pt will be ind with advanced HEP to return to work related activities without leakage    Time 12    Period Weeks    Status On-going    Target Date 04/19/22      PT LONG TERM GOAL #2   Title Pt will be knowledgeable on how to manage pain post surgery and not have pain limiting any normal activities    Status Achieved      PT LONG TERM GOAL #3   Title Pt will report no leakage with coughing or squatting so he can wear regular underwear.    Time 12    Period Weeks    Status On-going    Target Date 04/19/22                   Plan - 01/25/22 1120     Clinical Impression Statement Pt still having leakage with stress but can hold it when trying to get to the bathroom.  Pt needed some tactile cues and verbal cues to slow down between reps.  pt doing the exercises correctly  but still needing to work on some basic strengthening exercises.  Pt is expected to continue to progress and will benefit from 1-2 more visits to progress to more challenging exercises for lifting and working on single leg stability.  Pt has a lot of tension in upper traps and also requires cues to relax and use core.  Pt is recommended to continue to achieve all functional goals and reduce to biweekly.    PT Treatment/Interventions ADLs/Self Care Home Management;Biofeedback;Cryotherapy;Electrical Stimulation;Moist Heat;Therapeutic activities;Therapeutic exercise;Neuromuscular re-education;Taping;Dry needling;Passive range of motion;Patient/family education;Manual techniques    PT Next Visit Plan single leg ex's and rotation presses on knees or half kneeling, squats, push up and plank on knees, thoracic rotation    PT Home Exercise Plan [id number]    Consulted and Agree with Plan of Care Patient             Patient will benefit from skilled therapeutic intervention in order to improve the following deficits and impairments:  Postural dysfunction, Impaired flexibility, Decreased strength, Decreased coordination  Visit Diagnosis: Muscle weakness (generalized) - Plan: PT plan of care cert/re-cert     Problem List Patient Active Problem List   Diagnosis Date Noted   Prostate cancer ([place]) 11/14/2021   Elevated PSA 11/05/2021   Malignant neoplasm of prostate ([place]) 10/08/2017   Sinusitis, chronic 07/15/2016   Smoker 07/15/2016   Essential hypertension 07/15/2016    Camillo Flaming Real Cona, PT 01/25/2022, 5:09 PM  [facility] @ [facility] [place], [place], [address] Phone: [phone]   Fax:  [phone]  Name: Erik Gill MRN: [id number] Date of Birth: Jul 25, 1961  PHYSICAL THERAPY DISCHARGE SUMMARY  Visits from Start of Care: 5  Current functional level related to goals / functional outcomes:   See above Remaining  deficits: See above   Education / Equipment: HEP   Patient agrees to discharge. Patient goals were partially met. Patient is being discharged due to not returning since the last visit.  Gustavus Bryant, PT 05/24/22 10:11 AM

## 2022-01-28 ENCOUNTER — Encounter (HOSPITAL_COMMUNITY): Payer: Self-pay

## 2022-01-28 ENCOUNTER — Other Ambulatory Visit: Payer: Self-pay

## 2022-01-28 ENCOUNTER — Ambulatory Visit (HOSPITAL_COMMUNITY)
Admission: EM | Admit: 2022-01-28 | Discharge: 2022-01-28 | Disposition: A | Discharge status: Home / Self Care | Payer: 59 | Attending: Physician Assistant | Admitting: Physician Assistant

## 2022-01-28 DIAGNOSIS — M79674 Pain in right toe(s): Secondary | ICD-10-CM

## 2022-01-28 DIAGNOSIS — R030 Elevated blood-pressure reading, without diagnosis of hypertension: Secondary | ICD-10-CM

## 2022-01-28 DIAGNOSIS — M109 Gout, unspecified: Secondary | ICD-10-CM

## 2022-01-28 MED ORDER — PREDNISONE 10 MG (21) PO TBPK: ORAL_TABLET | ORAL | 0 refills | Status: DC

## 2022-01-28 NOTE — ED Triage Notes (Signed)
Pt presents with left foot pain that started a couple of days ago. He thinks it may be gout.

## 2022-01-28 NOTE — ED Provider Notes (Signed)
[facility]    CSN: [id number] Arrival date & time: 01/28/22  1146      History   Chief Complaint Chief Complaint  Patient presents with   Gout    HPI Erik Gill is a 61 y.o. male.   Patient presents today with a 1 day history of left MTP joint pain.  He denies any known injury or increase in activity prior to symptom onset.  Reports pain is rated 8/9 on a 0-10 pain scale, localized to right MTP without radiation, described as a shooting/sharp, worse with attempted ambulation or palpation, no alleviating factors identified.  He has not tried any over-the-counter medication for symptom management.  He does have a history of gout but has not had an attack in many years.  He is not currently on any uric acid lowering medication.  He denies any recent dietary changes or increased alcohol consumption.  He does not take a thiazide diuretic and has not had any recent medication changes.  He denies history of diabetes.  He denies any systemic symptoms including fever, widespread pain, nausea, vomiting, weakness, malaise.  Patient's blood pressure was noted to be very elevated today.  Suspect this is related to acute pain.  He is prescribed antihypertensive medication and has been taking this as prescribed.  Denies any recent NSAID use, decongestants or increased caffeine/sodium consumption.  He denies any chest pain, shortness of breath, headache, vision changes, dizziness.   Past Medical History:  Diagnosis Date   Elevated PSA    GERD (gastroesophageal reflux disease)    Headache    sinus related   Hypertension    Nocturia    Prostate cancer [facility])    Wears glasses     Patient Active Problem List   Diagnosis Date Noted   Prostate cancer ([place]) 11/14/2021   Elevated PSA 11/05/2021   Malignant neoplasm of prostate ([place]) 10/08/2017   Sinusitis, chronic 07/15/2016   Smoker 07/15/2016   Essential hypertension 07/15/2016    Past Surgical History:  Procedure Laterality  Date   HERNIA REPAIR Right    LYMPHADENECTOMY Bilateral 11/14/2021   Procedure: LYMPHADENECTOMY, PELVIC;  Surgeon: Raynelle Bring, MD;  Location: [facility];  Service: Urology;  Laterality: Bilateral;   PROSTATE BIOPSY N/A 08/29/2017   Procedure: BIOPSY TRANSRECTAL ULTRASONIC PROSTATE (TUBP);  Surgeon: Ceasar Mons, MD;  Location: [facility];  Service: Urology;  Laterality: N/A;   ROBOT ASSISTED LAPAROSCOPIC RADICAL PROSTATECTOMY N/A 11/14/2021   Procedure: XI ROBOTIC ASSISTED LAPAROSCOPIC RADICAL PROSTATECTOMY LEVEL 1;  Surgeon: Raynelle Bring, MD;  Location: [facility];  Service: Urology;  Laterality: N/A;       Home Medications    Prior to Admission medications   Medication Sig Start Date End Date Taking? Authorizing Provider  predniSONE (STERAPRED UNI-PAK 21 TAB) 10 MG (21) TBPK tablet As directed 01/28/22  Yes Edana Aguado K, PA-C  amLODipine-benazepril (LOTREL) 10-40 MG capsule Take 1 capsule by mouth daily. 09/28/16   [provider]  cetirizine (ZYRTEC) 10 MG tablet Take 10 mg by mouth daily as needed for allergies.    [provider]  docusate sodium (COLACE) 100 MG capsule Take 1 capsule (100 mg total) by mouth 2 (two) times daily. 11/14/21   Debbrah Alar, PA-C  fluticasone (FLONASE) 50 MCG/ACT nasal spray Place 1 spray into both nostrils daily. 01/11/22   Marney Setting, NP  metoprolol succinate (TOPROL-XL) 25 MG 24 hr tablet Take 25 mg by mouth daily.    [provider]  Naphazoline-Pheniramine (OPCON-A) 0.027-0.315 % SOLN Place 1 drop into both eyes daily as needed (allergies).    [provider]  sulfamethoxazole-trimethoprim (BACTRIM DS) 800-160 MG tablet Take 1 tablet by mouth 2 (two) times daily. Start the day prior to foley removal appointment 11/14/21   Debbrah Alar, PA-C  traMADol (ULTRAM) 50 MG tablet Take 1-2 tablets (50-100 mg total) by mouth every 6 (six) hours as needed for moderate pain or severe pain.  11/14/21   Debbrah Alar, PA-C  triamcinolone cream (KENALOG) 0.1 % Apply 1 application topically 2 (two) times daily. To affected area(s) as needed Patient taking differently: Apply 1 application topically 2 (two) times daily as needed (rash). 01/18/19   Emeterio Reeve, DO    Family History Family History  Problem Relation Age of Onset   Cancer Father        prostate   Cancer Brother        prostate   Cancer Brother        prostate   Cancer Cousin        prostate cancer/first cousin    Social History Social History   Tobacco Use   Smoking status: Every Day    Packs/day: 0.50    Years: 1.00    Pack years: 0.50    Types: Cigarettes   Smokeless tobacco: Never   Tobacco comments:    0.5 PP2D  Vaping Use   Vaping Use: Never used  Substance Use Topics   Alcohol use: Not Currently    Comment: OCCASIONAL   Drug use: No     Allergies   Patient has no known allergies.   Review of Systems Review of Systems  Constitutional:  Positive for activity change. Negative for appetite change, fatigue and fever.  Respiratory:  Negative for cough and shortness of breath.   Cardiovascular:  Negative for chest pain.  Gastrointestinal:  Negative for abdominal pain, diarrhea, nausea and vomiting.  Musculoskeletal:  Positive for arthralgias, gait problem and joint swelling. Negative for myalgias.  Skin:  Positive for color change.  Neurological:  Negative for dizziness, light-headedness and headaches.    Physical Exam Triage Vital Signs ED Triage Vitals [01/28/22 1349]  Enc Vitals Group     BP (!) 174/94     Pulse Rate 80     Resp 16     Temp 99 F (37.2 C)     Temp Source Oral     SpO2 100 %     Weight      Height      Head Circumference      Peak Flow      Pain Score      Pain Loc      Pain Edu?      Excl. in [place]?    No data found.  Updated Vital Signs BP 138/77 (BP Location: Right Arm) Comment (BP Location): large cuff   Pulse 78    Temp 98.6 F (37 C) (Oral)     Resp 20    SpO2 100%   Visual Acuity Right Eye Distance:   Left Eye Distance:   Bilateral Distance:    Right Eye Near:   Left Eye Near:    Bilateral Near:     Physical Exam Vitals reviewed.  Constitutional:      General: He is awake.     Appearance: Normal appearance. He is well-developed. He is not ill-appearing.     Comments: Very pleasant male appears stated age in no acute  distress sitting comfortably in exam room  HENT:     Head: Normocephalic and atraumatic.  Cardiovascular:     Rate and Rhythm: Normal rate and regular rhythm.     Pulses:          Posterior tibial pulses are 2+ on the right side and 2+ on the left side.     Heart sounds: Normal heart sounds, S1 normal and S2 normal. No murmur heard.    Comments: Unable to assess capillary refill in right great toe secondary to dystrophic nails/onychomycosis Pulmonary:     Effort: Pulmonary effort is normal.     Breath sounds: Normal breath sounds. No stridor. No wheezing, rhonchi or rales.     Comments: Clear to auscultation bilaterally Musculoskeletal:     Right foot: Normal range of motion. No deformity.  Feet:     Right foot:     Protective Sensation: 10 sites tested.  10 sites sensed.     Skin integrity: Dry skin present. No ulcer, blister or skin breakdown.     Toenail Condition: Right toenails are abnormally thick. Fungal disease present.    Comments: Tenderness to palpation over right MTP joint.  No deformity noted.  Foot neurovascularly intact. Neurological:     Mental Status: He is alert.  Psychiatric:        Behavior: Behavior is cooperative.     UC Treatments / Results  Labs (all labs ordered are listed, but only abnormal results are displayed) Labs Reviewed - No data to display  EKG   Radiology No results found.  Procedures Procedures (including critical care time)  Medications Ordered in UC Medications - No data to display  Initial Impression / Assessment and Plan / UC Course  I have  reviewed the triage vital signs and the nursing notes.  Pertinent labs & imaging results that were available during my care of the patient were reviewed by me and considered in my medical decision making (see chart for details).     Suspect gout flare as etiology of symptoms given history of same with similar presenting symptoms.  Patient is afebrile, nontoxic, nontachycardic, well-appearing.  Given elevated blood pressure will defer NSAID use and use of steroids to treat condition.  He denies history of diabetes.  He was instructed not to take NSAIDs with this medication.  Recommended elevation for additional symptom relief.  If symptoms persist he is to follow-up with podiatry for further evaluation and management was given contact information for local provider.  Discussed alarm symptoms that warrant emergent evaluation including increased pain, fever, nausea, vomiting, weakness, lethargy.  Strict return precautions given to which she expressed understanding.  Blood pressure is very elevated today.  Denies any signs/symptoms of endorgan damage.  Recommend he monitor this at home and if persistently elevated follow-up with PCP.  He is to avoid NSAIDs, decongestants, caffeine, sodium.  Discussed that if he develops any chest pain, shortness of breath, headache, vision changes in the setting of high blood pressure he needs to go to the emergency room.  Final Clinical Impressions(s) / UC Diagnoses   Final diagnoses:  Acute gout involving toe of right foot, unspecified cause  Great toe pain, right  Elevated blood pressure reading     Discharge Instructions      I believe that you have gout.  Please start prednisone taper as prescribed.  Do not take NSAIDs including aspirin, ibuprofen/Advil, naproxen/Aleve with this medication.  You can use Tylenol for breakthrough pain.  Keep your foot  elevated.  If symptoms are not improving quickly please follow-up with podiatry, call call to schedule an  appointment.  If you have any worsening symptoms including severe pain, swelling, fever, nausea, vomiting you need to be seen immediately.  Your blood pressure was elevated today.  Please monitor this at home.  If persistently elevated follow-up with your PCP.  If you develop any chest pain, shortness of breath, headache, vision changes in the setting of high blood pressure you need to go to the emergency room.  Take your medication as prescribed.  Avoid NSAIDs (aspirin, ibuprofen/Advil, naproxen/Aleve), decongestants, sodium, caffeine.  Make sure you drink plenty of water.     ED Prescriptions     Medication Sig Dispense Auth. Provider   predniSONE (STERAPRED UNI-PAK 21 TAB) 10 MG (21) TBPK tablet As directed 21 tablet Arnetra Terris K, PA-C      PDMP not reviewed this encounter.   Terrilee Croak, PA-C 01/28/22 1433

## 2022-01-28 NOTE — Discharge Instructions (Signed)
I believe that you have gout.  Please start prednisone taper as prescribed.  Do not take NSAIDs including aspirin, ibuprofen/Advil, naproxen/Aleve with this medication.  You can use Tylenol for breakthrough pain.  Keep your foot elevated.  If symptoms are not improving quickly please follow-up with podiatry, call call to schedule an appointment.  If you have any worsening symptoms including severe pain, swelling, fever, nausea, vomiting you need to be seen immediately.  Your blood pressure was elevated today.  Please monitor this at home.  If persistently elevated follow-up with your PCP.  If you develop any chest pain, shortness of breath, headache, vision changes in the setting of high blood pressure you need to go to the emergency room.  Take your medication as prescribed.  Avoid NSAIDs (aspirin, ibuprofen/Advil, naproxen/Aleve), decongestants, sodium, caffeine.  Make sure you drink plenty of water.

## 2022-02-20 NOTE — Progress Notes (Signed)
I called pt to introduce myself as the Prostate Nurse Navigator and the Coordinator of the Prostate [place]. ?  ?1. I confirmed with the patient he is aware of his referral to the clinic 3/28, arriving @ 12:30 pm.  ?  ?2. I discussed the format of the clinic and the physicians he will be seeing that day. ?  ?3. I discussed where the clinic is located and how to contact me. ?  ?4. I confirmed his address and informed him I would be mailing a packet of information and forms to be completed. I asked him to bring them with him the day of his appointment.  ?  ?He voiced understanding of the above. I asked him to call me if he has any questions or concerns regarding his appointments or the forms he needs to complete.  ?

## 2022-02-27 NOTE — Progress Notes (Signed)
RN left message with patient to remind of upcoming [facility] appointment for 3/28, with arrival time of 12:30.  Contact number left for any additional questions.  ?

## 2022-02-28 ENCOUNTER — Inpatient Hospital Stay: Payer: 59 | Attending: Oncology | Admitting: Oncology

## 2022-02-28 ENCOUNTER — Ambulatory Visit
Admission: RE | Admit: 2022-02-28 | Discharge: 2022-02-28 | Disposition: A | Discharge status: Home / Self Care | Payer: 59 | Source: Ambulatory Visit | Attending: Radiation Oncology | Admitting: Radiation Oncology

## 2022-02-28 ENCOUNTER — Ambulatory Visit: Payer: 59 | Admitting: Genetic Counselor

## 2022-02-28 VITALS — BP 168/97 | HR 79 | Temp 97.0°F | Resp 18 | Ht 68.0 in | Wt 210.4 lb

## 2022-02-28 DIAGNOSIS — F17210 Nicotine dependence, cigarettes, uncomplicated: Secondary | ICD-10-CM | POA: Diagnosis not present

## 2022-02-28 DIAGNOSIS — Z8042 Family history of malignant neoplasm of prostate: Secondary | ICD-10-CM

## 2022-02-28 DIAGNOSIS — C61 Malignant neoplasm of prostate: Secondary | ICD-10-CM

## 2022-02-28 DIAGNOSIS — Z9079 Acquired absence of other genital organ(s): Secondary | ICD-10-CM

## 2022-02-28 NOTE — Progress Notes (Signed)
?Reason for the request: Prostate cancer ? ?HPI: I was asked by Dr. Alinda Money to evaluate Erik Gill for the evaluation of prostate cancer.  He is a 61 year old with prostate cancer initially diagnosed in 2018.  At that time he is elevated PSA and underwent a biopsy that confirmed the presence of Gleason score 4+3 equal 7.  He elected not to proceed with surgery for the last few years for personal reasons and subsequently was evaluated in November 2022 by Dr. Alinda Money.  At that time his PSA was up to 59.8.  PSMA PET scan obtained and did not show any evidence of metastatic disease with 2 focal area of intense uptake within the prostate gland.  Based on these findings he underwent radical prostatectomy completed by Dr. Alinda Money on November 14, 2022.  The final pathology showed T3b adenocarcinoma with 1 out of 714 lymph node showed metastatic carcinoma.  His follow-up PSA was 0.22 on February 08, 2022.  Clinically, he reports feeling well although he has some incontinence which is improving.  He denies any fevers or chills or sweats.  He denies any hematuria or dysuria. ? ?He does not report any headaches, blurry vision, syncope or seizures. Does not report any fevers, chills or sweats.  Does not report any cough, wheezing or hemoptysis.  Does not report any chest pain, palpitation, orthopnea or leg edema.  Does not report any nausea, vomiting or abdominal pain.  Does not report any constipation or diarrhea.  Does not report any skeletal complaints.    Does not report frequency, urgency or hematuria.  Does not report any skin rashes or lesions. Does not report any heat or cold intolerance.  Does not report any lymphadenopathy or petechiae.  Does not report any anxiety or depression.  Remaining review of systems is negative.  ? ? ? ?Past Medical History:  ?Diagnosis Date  ? Elevated PSA   ? GERD (gastroesophageal reflux disease)   ? Headache   ? sinus related  ? Hypertension   ? Nocturia   ? Prostate cancer ([place])   ? Wears  glasses   ?: ? ? ?Past Surgical History:  ?Procedure Laterality Date  ? HERNIA REPAIR Right   ? LYMPHADENECTOMY Bilateral 11/14/2021  ? Procedure: LYMPHADENECTOMY, PELVIC;  Surgeon: Erik Bring, MD;  Location: [facility];  Service: Urology;  Laterality: Bilateral;  ? PROSTATE BIOPSY N/A 08/29/2017  ? Procedure: BIOPSY TRANSRECTAL ULTRASONIC PROSTATE (TUBP);  Surgeon: Erik Mons, MD;  Location: [facility];  Service: Urology;  Laterality: N/A;  ? ROBOT ASSISTED LAPAROSCOPIC RADICAL PROSTATECTOMY N/A 11/14/2021  ? Procedure: XI ROBOTIC ASSISTED LAPAROSCOPIC RADICAL PROSTATECTOMY LEVEL 1;  Surgeon: Erik Bring, MD;  Location: [facility];  Service: Urology;  Laterality: N/A;  ?: ? ? ?Current Outpatient Medications:  ?  amLODipine-benazepril (LOTREL) 10-40 MG capsule, Take 1 capsule by mouth daily., Disp: , Rfl:  ?  ASPIRIN PO, Take 81 mg by mouth., Disp: , Rfl:  ?  metoprolol succinate (TOPROL-XL) 25 MG 24 hr tablet, Take 25 mg by mouth daily., Disp: , Rfl:  ?  cetirizine (ZYRTEC) 10 MG tablet, Take 10 mg by mouth daily as needed for allergies. (Patient not taking: Reported on 02/28/2022), Disp: , Rfl:  ?  docusate sodium (COLACE) 100 MG capsule, Take 1 capsule (100 mg total) by mouth 2 (two) times daily. (Patient not taking: Reported on 02/28/2022), Disp: , Rfl:  ?  fluticasone (FLONASE) 50 MCG/ACT nasal spray, Place 1 spray into both nostrils daily. (Patient not  taking: Reported on 02/28/2022), Disp: 16 g, Rfl: 2 ?  Naphazoline-Pheniramine (OPCON-A) 0.027-0.315 % SOLN, Place 1 drop into both eyes daily as needed (allergies). (Patient not taking: Reported on 02/28/2022), Disp: , Rfl: : ? ?No Known Allergies: ? ? ?Family History  ?Problem Relation Age of Onset  ? Cancer Father   ?     prostate  ? Cancer Brother   ?     prostate  ? Cancer Brother   ?     prostate  ? Cancer Cousin   ?     prostate cancer/first cousin  ?: ? ? ?Social History  ? ?Socioeconomic History  ? Marital status: Single  ?   Spouse name: Not on file  ? Number of children: 2  ? Years of education: Not on file  ? Highest education level: Not on file  ?Occupational History  ?  Comment: works night shift  ?Tobacco Use  ? Smoking status: Every Day  ?  Packs/day: 0.50  ?  Years: 1.00  ?  Pack years: 0.50  ?  Types: Cigarettes  ? Smokeless tobacco: Never  ? Tobacco comments:  ?  0.5 PP2D  ?Vaping Use  ? Vaping Use: Never used  ?Substance and Sexual Activity  ? Alcohol use: Not Currently  ?  Comment: OCCASIONAL  ? Drug use: No  ? Sexual activity: Yes  ?  Birth control/protection: None  ?Other Topics Concern  ? Not on file  ?Social History Narrative  ? Father and two brothers with hx of prostate cancer. Single. Has 2 daughters and 2 sons.  ? ?Social Determinants of Health  ? ?Financial Resource Strain: Not on file  ?Food Insecurity: Not on file  ?Transportation Needs: Not on file  ?Physical Activity: Not on file  ?Stress: Not on file  ?Social Connections: Not on file  ?Intimate Partner Violence: Not on file  ?: ? ?Pertinent items are noted in HPI. ? ?Exam: ? ?General appearance: alert and cooperative appeared without distress. ?Head: atraumatic without any abnormalities. ?Eyes: conjunctivae/corneas clear. PERRL.  Sclera anicteric. ?Throat: lips, mucosa, and tongue normal; without oral thrush or ulcers. ?Resp: clear to auscultation bilaterally without rhonchi, wheezes or dullness to percussion. ?Cardio: regular rate and rhythm, S1, S2 normal, no murmur, click, rub or gallop ?GI: soft, non-tender; bowel sounds normal; no masses,  no organomegaly ?Skin: Skin color, texture, turgor normal. No rashes or lesions ?Lymph nodes: Cervical, supraclavicular, and axillary nodes normal. ?Neurologic: Grossly normal without any motor, sensory or deep tendon reflexes. ?Musculoskeletal: No joint deformity or effusion. ? ? ? ?Assessment and Plan:  ? ?61 year old with prostate cancer initially diagnosed in 2018 but did not receive definitive therapy till December  2022.  He is PSA was as high as 59.8 and Gleason score was 3+5 = 8 after his radical prostatectomy.  He had 1 out of 14 lymph nodes involved with the pathological staging of T3bN1 ? ?His case was discussed today in the prostate cancer multidisciplinary clinic including reviewing his imaging studies with radiology as well as specimen reviewed with the reviewing pathologist.  Treatment options were discussed which include salvage radiation therapy with short term ADT at this time.  Complication associated with androgen deprivation were discussed at this time.  Additional systemic therapy including androgen receptor pathway inhibitors and systemic chemotherapy will be deferred at this time. ? ?All his questions were answered to his satisfaction.  He appears to be agreeable at this time to proceed with salvage therapy. ? ?30  minutes  were dedicated to this visit. The time was spent on reviewing laboratory data, imaging studies, discussing treatment options, reviewing pathology results and answering questions regarding future plan. ? ? ?  A copy of this consult has been forwarded to the requesting physician. ? ?

## 2022-02-28 NOTE — Progress Notes (Signed)
? ?                              Care Plan Summary ? ?Name: Charistopher Rumble ?DOB: October 17, 1961  ? ?Your Medical Team:  ? Urologist -  Dr. Raynelle Bring, [facility] ? Radiation Oncologist - Dr. Tyler Pita, [place]  ? Medical Oncologist - Dr. Zola Button, [place] ? ?Recommendations: ?1) Short Term Androgen Deprivation Therapy (ST-ADT)  ?2) Daily Radiation   ? ?* These recommendations are based on information available as of today?s consult.      Recommendations may change depending on the results of further tests or exams. ? ? ? ?Next Steps: ?1) [facility] will set up your ADT appointment at their office.   ?2) [place] will contact you with radiation dates.   ? ? ?When appointments need to be scheduled, you will be contacted by [facility] and/or [facility]. ? ?Questions?  Please do not hesitate to call Katheren Puller, BSN, RN at [phone] with any questions or concerns.  Kathlee Nations is your Oncology Nurse Navigator and is available to assist you while you?re receiving your medical care at [facility].   ?

## 2022-02-28 NOTE — Progress Notes (Signed)
?Radiation Oncology         [phone] ?________________________________ ? ?Multidisciplinary Prostate Cancer Clinic ? ?Radiation Oncology Consultation ? ?Name: Erik Gill MRN: [id number]  ?Date: 02/28/2022  DOB: 1961-08-24 ? ?IR:CVELFY, Gwyndolyn Saxon, MD  Raynelle Bring, MD  ? ?REFERRING PHYSICIAN: Raynelle Bring, MD ? ?DIAGNOSIS: 61 y.o. gentleman with adverse pathology and detectable, postoperative PSA at 0.22, s/p RALP 11/2021 for stage pT3bN1, locally advanced, Gleason 3+5 adenocarcinoma of the prostate. ? ?  ICD-10-CM   ?1. Malignant neoplasm of prostate ([place])  C61   ?  ? ? ?HISTORY OF PRESENT ILLNESS::Erik Gill is a 61 y.o. gentleman.with a longstanding history of GERD elevated PSA since at least 2015, at which time his PSA was in the 9-10 range.Marland Kitchen  He initially declined prostate biopsy but was referred back to Dr. Lovena Neighbours for further elevation of the PSA at 23.1 in 06/2017. He elected to proceed with biopsy on 08/29/17 showing 7 of 12 cores positive, with the highest Gleason score being 4+3 in 1 core.  Additionally, he had one core with Gleason 3+4, and five cores with Gleason 3+3.  ? ? ? ?We initially met with the patient in consult on 10/08/17 and the patient also met with Dr. Alinda Money in December 2018 but ultimately, did not proceed with treatment and was unfortunately lost to follow up. He did return to for a follow-up visit with Dr. Lovena Neighbours in October 2020 with a PSA up to 38.1 at that time.  He did have disease restaging scans with CT A/P and bone scan which were both negative for metastatic disease.  Treatment options were reviewed but again, he declined treatment and was lost to follow up. ? ?Most recently, he was referred to Dr. Alinda Money on 08/23/21 after his PSA rose further to 74.37 in 06/2021. Digital rectal examination was performed at that time and, within the limitation of his body habitus, showed some induration toward the right apex.  A repeat PSA at that time had slightly decreased to 59.8. He  proceeded to PSMA PET scan on 10/06/21 showing two focal areas of intense radiotracer activity within prostate gland but no evidence of metastatic adenopathy, visceral metastasis, or skeletal metastasis. He also underwent prostate MRI for surgical planning on 11/09/21 showing a diffusely abnormal transitional zone consistent with diffuse disease (PI-RADS 5); a focal area of dark appearance on ADC with signs of restricted diffusion in the right posterolateral apical peripheral zone (PI-RADS 4) and bulging along the anterior prostate with a long segment of abutment of the capsule, at least 2 cm, but no gross extracapsular extension. ? ? ? ? ? ?He ultimately elected to proceed with RALP under the care and direction of Dr. Alinda Money.  This procedure was performed on 11/14/21, with final surgical pathology confirming T3bN1, Gleason 3+5 prostatic adenocarcinoma with extraprostatic extension and positive margins present at the bladder neck and left base; left seminal vesicle invasion, and 1 out of a total of 14 biopsied lymph nodes positive for metastatic prostatic adenocarcinoma. Unfortunately, his post-operative PSA, although low, remained detectable at 0.22. ? ? ? ? ? ? ? ? ? ?The patient reviewed the pathology and postoperative PSA results with his urologist and he has kindly been referred today to the multidisciplinary prostate cancer clinic for presentation of pathology and radiology studies in our conference for discussion of potential radiation treatment options and clinical evaluation. ? ?PREVIOUS RADIATION THERAPY: No ? ?PAST MEDICAL HISTORY:  has a past medical history of Elevated PSA, GERD (gastroesophageal reflux disease), Headache,  Hypertension, Nocturia, Prostate cancer ([place]), and Wears glasses.   ? ?PAST SURGICAL HISTORY: ?Past Surgical History:  ?Procedure Laterality Date  ? HERNIA REPAIR Right   ? LYMPHADENECTOMY Bilateral 11/14/2021  ? Procedure: LYMPHADENECTOMY, PELVIC;  Surgeon: Raynelle Bring, MD;   [facility];  Service: Urology;  Laterality: Bilateral;  ? PROSTATE BIOPSY N/A 08/29/2017  ? Procedure: BIOPSY TRANSRECTAL ULTRASONIC PROSTATE (TUBP);  Surgeon: Ceasar Mons, MD;  Location: [facility];  Service: Urology;  Laterality: N/A;  ? ROBOT ASSISTED LAPAROSCOPIC RADICAL PROSTATECTOMY N/A 11/14/2021  ? Procedure: XI ROBOTIC ASSISTED LAPAROSCOPIC RADICAL PROSTATECTOMY LEVEL 1;  Surgeon: Raynelle Bring, MD;  Location: [facility];  Service: Urology;  Laterality: N/A;  ? ? ?FAMILY HISTORY: family history includes Cancer in his brother, brother, cousin, and father. ? ?SOCIAL HISTORY:  reports that he has been smoking cigarettes. He has a 0.50 pack-year smoking history. He has never used smokeless tobacco. He reports that he does not currently use alcohol. He reports that he does not use drugs. ? ?ALLERGIES: Patient has no known allergies. ? ?MEDICATIONS:  ?Current Outpatient Medications  ?Medication Sig Dispense Refill  ? amLODipine-benazepril (LOTREL) 10-40 MG capsule Take 1 capsule by mouth daily.    ? ASPIRIN PO Take 81 mg by mouth.    ? cetirizine (ZYRTEC) 10 MG tablet Take 10 mg by mouth daily as needed for allergies. (Patient not taking: Reported on 02/28/2022)    ? docusate sodium (COLACE) 100 MG capsule Take 1 capsule (100 mg total) by mouth 2 (two) times daily. (Patient not taking: Reported on 02/28/2022)    ? fluticasone (FLONASE) 50 MCG/ACT nasal spray Place 1 spray into both nostrils daily. (Patient not taking: Reported on 02/28/2022) 16 g 2  ? metoprolol succinate (TOPROL-XL) 25 MG 24 hr tablet Take 25 mg by mouth daily.    ? Naphazoline-Pheniramine (OPCON-A) 0.027-0.315 % SOLN Place 1 drop into both eyes daily as needed (allergies). (Patient not taking: Reported on 02/28/2022)    ? ?No current facility-administered medications for this encounter.  ? ? ?REVIEW OF SYSTEMS:  On review of systems, the patient reports that he is doing well overall. He denies any chest pain,  shortness of breath, cough, fevers, chills, night sweats, unintended weight changes. He denies any bowel disturbances, and denies abdominal pain, nausea or vomiting. He denies any new musculoskeletal or joint aches or pains. A complete review of systems is obtained and is otherwise negative.  ? ?PHYSICAL EXAM:  ?Wt Readings from Last 3 Encounters:  ?02/28/22 210 lb 6 oz (95.4 kg)  ?11/14/21 213 lb (96.6 kg)  ?11/03/21 202 lb (91.6 kg)  ? ?Temp Readings from Last 3 Encounters:  ?02/28/22 (!) 97 ?F (36.1 ?C) (Temporal)  ?01/28/22 98.6 ?F (37 ?C) (Oral)  ?01/11/22 98.5 ?F (36.9 ?C) (Oral)  ? ?BP Readings from Last 3 Encounters:  ?02/28/22 (!) 168/97  ?01/28/22 138/77  ?01/11/22 (!) 181/85  ? ?Pulse Readings from Last 3 Encounters:  ?02/28/22 79  ?01/28/22 78  ?01/11/22 93  ? ? /10 ?In general this is a well appearing African-American male in no acute distress.  He's alert and oriented x4 and appropriate throughout the examination. Cardiopulmonary assessment is negative for acute distress and he exhibits normal effort.  ? ?KPS = 100 ? ?100 - Normal; no complaints; no evidence of disease. ?90   - Able to carry on normal activity; minor signs or symptoms of disease. ?80   - Normal activity with effort; some signs or symptoms  of disease. ?33   - Cares for self; unable to carry on normal activity or to do active work. ?60   - Requires occasional assistance, but is able to care for most of his personal needs. ?50   - Requires considerable assistance and frequent medical care. ?69   - Disabled; requires special care and assistance. ?30   - Severely disabled; hospital admission is indicated although death not imminent. ?20   - Very sick; hospital admission necessary; active supportive treatment necessary. ?10   - Moribund; fatal processes progressing rapidly. ?0     - Dead ? ?Karnofsky DA, Abelmann WH, Craver LS and Burchenal [facility] [phone]) The use of the nitrogen mustards in the palliative treatment of carcinoma: with particular  reference to bronchogenic carcinoma Cancer 1 [phone] ? ? ?LABORATORY DATA:  ?Lab Results  ?Component Value Date  ? WBC 8.9 11/03/2021  ? HGB 11.9 (L) 11/15/2021  ? HCT 36.3 (L) 11/15/2021  ? MCV 83.2 11/03/2021  ? PLT 29

## 2022-03-01 ENCOUNTER — Encounter: Payer: Self-pay | Admitting: Genetic Counselor

## 2022-03-01 DIAGNOSIS — Z8042 Family history of malignant neoplasm of prostate: Secondary | ICD-10-CM | POA: Insufficient documentation

## 2022-03-01 DIAGNOSIS — Z1379 Encounter for other screening for genetic and chromosomal anomalies: Secondary | ICD-10-CM | POA: Insufficient documentation

## 2022-03-01 NOTE — Progress Notes (Signed)
REFERRING PROVIDER: ?Zola Button, MD ?[place] ?[place], [place] [address] ? ?PRIMARY PROVIDER:  ?Shirline Frees, MD ? ?PRIMARY REASON FOR VISIT:  ?1. Malignant neoplasm of prostate ([place])   ?2. Family history of prostate cancer   ? ? ?HISTORY OF PRESENT ILLNESS:   ?Erik Gill, a 61 y.o. male, was seen for a [place] cancer genetics consultation at the request of Dr. Alen Blew due to a personal and family history of cancer.  Erik Gill presents to clinic today to discuss the possibility of a hereditary predisposition to cancer, to discuss genetic testing, and to further clarify his future cancer risks, as well as potential cancer risks for family members.  ? ?Erik Gill is a 61 year old male with prostate cancer initially diagnosed in 2018, at the age of 89. He underwent a radical prostatectomy in December 2022, at the age of 65, and the final pathology showed high-risk prostate cancer.  ? ?CANCER HISTORY:  ?Oncology History  ?Malignant neoplasm of prostate ([place])  ?08/29/2017 Cancer Staging  ? Staging form: Prostate, AJCC 8th Edition ?- Clinical stage from 08/29/2017: Stage IIIA (cT1c, cN0, cM0, PSA: 23.1, Grade Group: 3) - Signed by Freeman Caldron, PA-C on 02/28/2022 ?Histopathologic type: Adenocarcinoma, NOS ?Stage prefix: Initial diagnosis ?Prostate specific antigen (PSA) range: 20 or greater ?Gleason primary pattern: 4 ?Gleason secondary pattern: 3 ?Gleason score: 7 ?Histologic grading system: 5 grade system ?Number of biopsy cores examined: 12 ?Number of biopsy cores positive: 7 ?Location of positive needle core biopsies: Both sides ? ?  ?10/08/2017 Initial Diagnosis  ? Malignant neoplasm of prostate [facility]) ?  ?11/14/2021 Cancer Staging  ? Staging form: Prostate, AJCC 8th Edition ?- Pathologic stage from 11/14/2021: Stage IVA (pT3b, pN1, cM0, PSA: 74.4, Grade Group: 4) - Signed by Freeman Caldron, PA-C on 02/28/2022 ?Histopathologic type: Adenocarcinoma, NOS ?Stage prefix: Initial diagnosis ?Prostate  specific antigen (PSA) range: 20 or greater ?Gleason primary pattern: 3 ?Gleason secondary pattern: 5 ?Gleason score: 8 ?Histologic grading system: 5 grade system ?Residual tumor (R): R1 - Microscopic ? ?  ? ? ?Past Medical History:  ?Diagnosis Date  ? Elevated PSA   ? GERD (gastroesophageal reflux disease)   ? Headache   ? sinus related  ? Hypertension   ? Nocturia   ? Prostate cancer ([place])   ? Wears glasses   ? ? ?Past Surgical History:  ?Procedure Laterality Date  ? HERNIA REPAIR Right   ? LYMPHADENECTOMY Bilateral 11/14/2021  ? Procedure: LYMPHADENECTOMY, PELVIC;  Surgeon: Raynelle Bring, MD;  [facility];  Service: Urology;  Laterality: Bilateral;  ? PROSTATE BIOPSY N/A 08/29/2017  ? Procedure: BIOPSY TRANSRECTAL ULTRASONIC PROSTATE (TUBP);  Surgeon: Ceasar Mons, MD;  Location[facility];  Service: Urology;  Laterality: N/A;  ? ROBOT ASSISTED LAPAROSCOPIC RADICAL PROSTATECTOMY N/A 11/14/2021  ? Procedure: XI ROBOTIC ASSISTED LAPAROSCOPIC RADICAL PROSTATECTOMY LEVEL 1;  Surgeon: Raynelle Bring, MD;  [facility];  Service: Urology;  Laterality: N/A;  ? ? ?Social History  ? ?Socioeconomic History  ? Marital status: Single  ?  Spouse name: Not on file  ? Number of children: 2  ? Years of education: Not on file  ? Highest education level: Not on file  ?Occupational History  ?  Comment: works night shift  ?Tobacco Use  ? Smoking status: Every Day  ?  Packs/day: 0.50  ?  Years: 1.00  ?  Pack years: 0.50  ?  Types: Cigarettes  ? Smokeless tobacco: Never  ? Tobacco comments:  ?  0.5 PP2D  ?Vaping Use  ? Vaping Use: Never used  ?Substance and Sexual Activity  ? Alcohol use: Not Currently  ?  Comment: OCCASIONAL  ? Drug use: No  ? Sexual activity: Yes  ?  Birth control/protection: None  ?Other Topics Concern  ? Not on file  ?Social History Narrative  ? Father and two brothers with hx of prostate cancer. Single. Has 2 daughters and 2 sons.  ? ?Social Determinants of Health   ? ?Financial Resource Strain: Not on file  ?Food Insecurity: Not on file  ?Transportation Needs: Not on file  ?Physical Activity: Not on file  ?Stress: Not on file  ?Social Connections: Not on file  ?  ? ?FAMILY HISTORY:  ?We obtained a detailed, 4-generation family history.  Significant diagnoses are listed below: ?Family History  ?Problem Relation Age of Onset  ? Cancer Father   ?     colon cancer  ? Cancer Brother   ?     prostate  ? Cancer Brother   ?     prostate  ? Cancer Cousin   ?     prostate cancer/maternal first cousin  ? ? ? ?Erik Gill has two brothers and both have a history of prostate cancer. His maternal cousin also has a history of prostate cancer. His father was diagnosed with colon cancer and is deceased. Erik Gill is unaware of previous family history of genetic testing for hereditary cancer risks. . ? ?GENETIC COUNSELING ASSESSMENT: Erik Gill is a 61 y.o. male with a personal and family history of cancer which is somewhat suggestive of a hereditary predisposition to cancer. We, therefore, discussed and recommended the following at today's visit.  ? ?DISCUSSION: We discussed that 5 - 10% of cancer is hereditary, with most cases of prostate cancer associated with BRCA1/2 and HOXB13.  There are other genes that can be associated with hereditary prostate cancer syndromes.  We discussed that testing is beneficial for several reasons including knowing how to follow individuals after completing their treatment, identifying whether potential treatment options would be beneficial, and understanding if other family members could be at risk for cancer and allowing them to undergo genetic testing.  ? ?We reviewed the characteristics, features and inheritance patterns of hereditary cancer syndromes. We also discussed genetic testing, including the appropriate family members to test, the process of testing, insurance coverage and turn-around-time for results. We discussed the implications of a  negative, positive, carrier and/or variant of uncertain significant result. We recommended Erik Gill pursue genetic testing for a panel that includes genes associated with prostate and colon cancer.  ? ?Based on Erik Gill's personal and family history of cancer, he meets medical criteria for genetic testing. Despite that he meets criteria, he may still have an out of pocket cost. We discussed that if his out of pocket cost for testing is over $100, the laboratory will call and confirm whether he wants to proceed with testing. If the out of pocket cost of testing is less than $100 he will be billed by the genetic testing laboratory.  ? ?PLAN: Despite our recommendation, Erik Gill did not wish to pursue genetic testing at today's visit. We understand this decision and remain available to coordinate genetic testing at any time in the future. We, therefore, recommend Erik Gill continue to follow the cancer screening guidelines given by his primary healthcare provider. ? ?Erik Gill's questions were answered to his satisfaction today. Our contact information was provided should additional questions or concerns  arise. Thank you for the referral and allowing [place] to share in the care of your patient.  ? ?Lucille Passy, MS, [place] ?Genetic Counselor ?[email or web address] ?(P) [phone] ? ?The patient was seen for a total of 20 minutes in face-to-face genetic counseling. The patient was seen alone.  Dr. Alen Blew was available to discuss this case as needed.  ? ?_______________________________________________________________________ ?For Office Staff:  ?Number of people involved in session: 1 ?Was an Intern/ student involved with case: no ? ?

## 2022-03-01 NOTE — Consult Note (Signed)
? ? ?Multi-Disciplinary Clinic     02/28/2022  ? ?-------------------------------------------------------------------------------- ?  ?Erik Gill  ?MRN: [id number]  ?DOB: 1961/10/07, 60 year old Male  ?SSN: [id number]  ? PRIMARY CARE:  Minette Brine, MD  ?REFERRING:  Raynelle Bring, Eduardo Osier  ?PROVIDER:  Ellison Hughs, M.D.  ?TREATING:  Raynelle Bring, M.D.  ?LOCATION:  [facility] [phone]  ?  ? ?-------------------------------------------------------------------------------- ?  ?CC/HPI: CC: Prostate Cancer  ? ?PCP: Dr. Azalia Bilis  ?Location of consult: [facility]  ? ?Erik Gill is a 60 year old gentleman with a strong family history of prostate cancer with a cousin and two brothers who have had prostate cancer. He reportedly had an elevated PSA of 10.6 in 2015 and was recommended to have a prostate biopsy but declined. His PSA further increased to 23.1 and he was seen by Dr. Aleen Campi and underwent a TRUS biopsy on 08/29/17 confirming Gleason 4+3=7 adenocarcinoma of the prostate with 7 out of 12 biopsy cores positive for cancer. He was seen by me in December of 2018 in consultation and was recommended to undergo therapy of curative intent for his high-risk prostate cancer. He had also been evaluated by Dr. Tammi Klippel to discuss radiotherapy options. He ultimately did not proceed with treatment as recommended. He finally followed up with Dr. Lovena Neighbours again in October of 2020 at which time his PSA was found to be further elevated at 38.1. He did undergo repeat staging imaging with a bone scan and CT scan of the abdomen and pelvis. The studies fortunately did not demonstrate evidence of obvious metastatic disease. He again saw me in consultation in December 2020 and was strongly advised to proceed with curative therapy. He again elected not to proceed with treatment as recommended. He was seen by his PCP in July 2022 at which time  his PSA was now noted to have increased to 74.37. He presented to me again in September 2022 and eventually agreed to proceed with a PSMA PET scan. His repeat PSA was 59.8 and a PSMA PET scan in November 2022 was negative for metastatic disease. He eventually elected to proceed with surgical therapy and is s/p a BNS RAL radical prostatectomy and BPLND on 11/14/21. His pathology demonstrated pT3b N1 Mx, Gleason 3+5=8 adenocarcinoma with positive surgical margins (bladder neck and right posterior base), 1/14 lymph nodes were positive. His initial postoperative PSA on 02/08/22 was 0.22.  ? ?Family history: Two brothers.  ? ?PMH: He has a history of hypertension.  ? ?Baseline urinary function: IPSS is 16.  ?Baseline erectile function: SHIM score is 12. He estimates that he can get an adequate erection for intercourse approximately 3 or 4 times out of 10. He will occasionally use Cialis with some help a tries not to use this all the time. He is concerned about his erectile function.  ? ?  ?ALLERGIES: No Allergies ?  ? ?MEDICATIONS: Alegra  ?Aspirin Ec 81 mg tablet, delayed release  ?Benzonatate  ?Cialis 20 mg tablet Oral  ?Lotrel 5 mg-20 mg capsule Oral  ?  ? ?GU PSH: Laparoscopy; Lymphadenectomy - 11/14/2021 ?Locm 300-'399Mg'$ /Ml Iodine,1Ml - 10/15/2019, 2018 ?Prostate Needle Biopsy - 2018 ?Robotic Radical Prostatectomy - 11/14/2021 ? ?  ?   ?PSH Notes: Inguinal Hernia Repair  ? ?NON-GU PSH: No Non-GU PSH   ? ?GU PMH: ED due to arterial insufficiency - 02/15/2022, - 11/23/2021, - 2018 ?Prostate Cancer - 02/15/2022, - 11/23/2021, - 11/08/2021, -  08/23/2021, [place] High Risk prostate cancer., - 2018, - 2018 ?Stress Incontinence - 02/15/2022, - 11/23/2021 ?  ?   ?PMH Notes:  ? ?1) Prostate cancer: He is s/p a BNS RAL radical prostatectomy and BPLND on 11/14/21.  ? ?Diagnosis: pT3b N1 Mx, Gleason 3+5=8 adenocarcinoma with positive surgical margins (bladder neck and right posterior base), 1/14 lymph nodes positive  ?Pretreatment PSA:  59.8  ?Pretreatment SHIM score: 12  ? ?NON-GU PMH: Hypertension ?  ? ?FAMILY HISTORY: 2 daughters - Daughter ?2 sons - Son ?Hypertension - Runs in Family ?malignant neoplasm of brain - Runs In Family  ? ?SOCIAL HISTORY: Marital Status: Single ?Preferred Language: [place]; Ethnicity: Not Hispanic Or Latino; Race: Black or African American ?Current Smoking Status: Patient smokes occasionally. Has smoked since 08/04/2014. Smokes 1/2 pack per day.  ? ?Tobacco Use Assessment Completed: Used Tobacco in last 30 days? ?Drinks 2 drinks per day.  ?Does not drink caffeine. ?  ?  Notes: Former smoker, Father deceased, Single, Occupation, History of smoking, Caffeine use, Number of children, Alcohol use, Mother alive and healthy  ? ?REVIEW OF SYSTEMS:    ?GU Review Male:   Patient denies frequent urination, hard to postpone urination, burning/ pain with urination, get up at night to urinate, leakage of urine, stream starts and stops, trouble starting your streams, and have to strain to urinate .  ?Gastrointestinal (Lower):   Patient denies diarrhea and constipation.  ?Gastrointestinal (Upper):   Patient denies nausea and vomiting.  ?Constitutional:   Patient denies fever, night sweats, weight loss, and fatigue.  ?Skin:   Patient denies skin rash/ lesion and itching.  ?Eyes:   Patient denies blurred vision and double vision.  ?Ears/ Nose/ Throat:   Patient denies sore throat and sinus problems.  ?Hematologic/Lymphatic:   Patient denies swollen glands and easy bruising.  ?Cardiovascular:   Patient denies leg swelling and chest pains.  ?Respiratory:   Patient denies cough and shortness of breath.  ?Endocrine:   Patient denies excessive thirst.  ?Musculoskeletal:   Patient denies back pain and joint pain.  ?Neurological:   Patient denies headaches and dizziness.  ?Psychologic:   Patient denies depression and anxiety.  ? ?VITAL SIGNS: None  ? ?MULTI-SYSTEM PHYSICAL EXAMINATION:    ?Constitutional: Well-nourished. No physical  deformities. Normally developed. Good grooming.  ? ?  ?Complexity of Data:  ?Lab Test Review:   PSA  ?Records Review:   Pathology Reports, Previous Patient Records  ?X-Ray Review: PET Scan: Reviewed Films.  ?  ? 02/08/22 08/23/21 10/03/19 09/16/19 09/04/14  ?PSA  ?Total PSA 0.22 ng/mL 59.80 ng/mL 38.10 ng/mL 35.22 ng/dl 10.63   ? ? ?PROCEDURES: None  ? ?ASSESSMENT:  ?    ICD-10 Details  ?1 GU:   Prostate Cancer - C61   ? ?PLAN:    ? ?      Schedule ?Labs: 6 Months - Urinalysis  ?  6 Months - PSA  ?  6 Months - Total Testosterone  ?Return Visit/Planned Activity: 6 Months - Office Visit  ? ? ?      Document ?Letter(s):  Created for Patient: Clinical Summary  ? ? ?     Notes:   1. Prostate cancer: We reviewed his pathology and imaging studies in the multidisciplinary conference today. He has been seen by both Dr. Alen Blew and Dr. Tammi Klippel as well and is well-informed about his options for salvage therapy. At this time, he is agreeable to proceed with short-term androgen deprivation therapy and salvage radiation  therapy to his prostatic fossa and pelvis. We have reviewed the risks/side effects of androgen deprivation therapy. He has just about regain continence and will continue his pelvic floor exercises and should be ready to proceed with radiation therapy when he returns from [place] in May as he currently plans.  ? ?I will then schedule him for follow-up for PSA monitoring in approximately 6 months.  ? ?2. Incontinence: Continue pelvic floor exercises.  ? ?3. Erectile dysfunction: He understands that androgen deprivation will have a significant impact on his libido. He understands that additional treatment with radiation therapy to the pelvis will likely further reduce his chances of recovery of erectile function although he did have preoperative erectile dysfunction even prior to his surgery and he understands that this risk is considerable regardless. He understands and accepts this risk and wishes to proceed.   ? ?CC: Dr. Azalia Bilis  ?Dr. Zola Button  ?Dr. Tyler Pita  ?Dr. Harrell Gave Lovena Neighbours  ? ?     Next Appointment:    ?  Next Appointment: 04/04/2022 08:30 AM  ?  Appointment Type: Laboratory Appointment  ?  L

## 2022-03-02 ENCOUNTER — Telehealth: Payer: Self-pay | Admitting: *Deleted

## 2022-03-02 NOTE — Telephone Encounter (Signed)
CALLED PATIENT TO ASK QUESTIONS, SPOKE WITH PATIENT 

## 2022-04-04 ENCOUNTER — Ambulatory Visit (HOSPITAL_COMMUNITY)
Admission: EM | Admit: 2022-04-04 | Discharge: 2022-04-04 | Disposition: A | Discharge status: Home / Self Care | Payer: 59 | Attending: Nurse Practitioner | Admitting: Nurse Practitioner

## 2022-04-04 ENCOUNTER — Telehealth: Payer: Self-pay | Admitting: *Deleted

## 2022-04-04 ENCOUNTER — Encounter (HOSPITAL_COMMUNITY): Payer: Self-pay

## 2022-04-04 DIAGNOSIS — J301 Allergic rhinitis due to pollen: Secondary | ICD-10-CM

## 2022-04-04 MED ORDER — LEVOCETIRIZINE DIHYDROCHLORIDE 5 MG PO TABS: 5.0000 mg | ORAL_TABLET | Freq: Every evening | ORAL | 0 refills | Status: DC

## 2022-04-04 MED ORDER — FLUTICASONE PROPIONATE 50 MCG/ACT NA SUSP: 1.0000 | Freq: Every day | NASAL | 2 refills | Status: DC

## 2022-04-04 NOTE — Discharge Instructions (Addendum)
-   You were seen today for allergic rhinitis ?-Please switch from Zyrtec to Xyzal; I have sent a prescription for Xyzal to your pharmacy ?-Please start using Flonase 2 sprays daily regularly ?-Continue over-the-counter eyedrops ?-Please start nasal saline rinses that we discussed ?- Follow up with PCP if your symptoms do not improve with this treatment ?

## 2022-04-04 NOTE — Telephone Encounter (Signed)
CALLED PATIENT TO REMIND OF SIM APPT. FOR 04-07-22- ARRIVAL TIME- 2:45 PM @ [place], SPOKE WITH PATIENT AND HE IS AWARE OF THIS APPT. ?

## 2022-04-04 NOTE — ED Triage Notes (Signed)
Pt states nasal congestion runny nose and cough for the past 2-3 weeks. Has been taking zyrtec with no relief. ?

## 2022-04-04 NOTE — ED Provider Notes (Signed)
?[facility] ? ? ? ?CSN: [id number] ?Arrival date & time: 04/04/22  1146 ? ? ?  ? ?History   ?Chief Complaint ?Chief Complaint  ?Patient presents with  ? Nasal Congestion  ? ? ?HPI ?Erik Gill is a 61 y.o. male.  ? ?Patient presents for 2 to 3 weeks of nasal congestion, sneezing, cough with sneezing.  Denies fevers, bodyaches, chills, abdominal pain, nausea/vomiting, chest pain, and shortness of breath.  He only coughs when he gets into a sneezing fit.  He reports at nighttime, he feels like he wheezes when he lays down.  Has tried taking Zyrtec without relief. ? ? ?Past Medical History:  ?Diagnosis Date  ? Elevated PSA   ? GERD (gastroesophageal reflux disease)   ? Headache   ? sinus related  ? Hypertension   ? Nocturia   ? Prostate cancer ([place])   ? Wears glasses   ? ? ?Patient Active Problem List  ? Diagnosis Date Noted  ? Family history of prostate cancer 03/01/2022  ? Genetic testing 03/01/2022  ? Elevated PSA 11/05/2021  ? Malignant neoplasm of prostate ([place]) 10/08/2017  ? Sinusitis, chronic 07/15/2016  ? Smoker 07/15/2016  ? Essential hypertension 07/15/2016  ? ? ?Past Surgical History:  ?Procedure Laterality Date  ? HERNIA REPAIR Right   ? LYMPHADENECTOMY Bilateral 11/14/2021  ? Procedure: LYMPHADENECTOMY, PELVIC;  Surgeon: Raynelle Bring, MD;  Location: [facility];  Service: Urology;  Laterality: Bilateral;  ? PROSTATE BIOPSY N/A 08/29/2017  ? Procedure: BIOPSY TRANSRECTAL ULTRASONIC PROSTATE (TUBP);  Surgeon: Ceasar Mons, MD;  Location: [facility];  Service: Urology;  Laterality: N/A;  ? ROBOT ASSISTED LAPAROSCOPIC RADICAL PROSTATECTOMY N/A 11/14/2021  ? Procedure: XI ROBOTIC ASSISTED LAPAROSCOPIC RADICAL PROSTATECTOMY LEVEL 1;  Surgeon: Raynelle Bring, MD;  Location: [facility];  Service: Urology;  Laterality: N/A;  ? ? ? ? ? ?Home Medications   ? ?Prior to Admission medications   ?Medication Sig Start Date End Date Taking? Authorizing Provider  ?levocetirizine (XYZAL) 5 MG  tablet Take 1 tablet (5 mg total) by mouth every evening. 04/04/22  Yes Eulogio Bear, NP  ?amLODipine-benazepril (LOTREL) 10-40 MG capsule Take 1 capsule by mouth daily. 09/28/16   [provider]  ?ASPIRIN PO Take 81 mg by mouth.    [provider]  ?docusate sodium (COLACE) 100 MG capsule Take 1 capsule (100 mg total) by mouth 2 (two) times daily. ?Patient not taking: Reported on 02/28/2022 11/14/21   Debbrah Alar, PA-C  ?fluticasone (FLONASE) 50 MCG/ACT nasal spray Place 1 spray into both nostrils daily. 04/04/22   Eulogio Bear, NP  ?metoprolol succinate (TOPROL-XL) 25 MG 24 hr tablet Take 25 mg by mouth daily.    [provider]  ?Naphazoline-Pheniramine (OPCON-A) 0.027-0.315 % SOLN Place 1 drop into both eyes daily as needed (allergies). ?Patient not taking: Reported on 02/28/2022    [provider]  ? ? ?Family History ?Family History  ?Problem Relation Age of Onset  ? Cancer Father   ?     colon cancer  ? Cancer Brother   ?     prostate  ? Cancer Brother   ?     prostate  ? Cancer Cousin   ?     prostate cancer/maternal first cousin  ? ? ?Social History ?Social History  ? ?Tobacco Use  ? Smoking status: Every Day  ?  Packs/day: 0.50  ?  Years: 1.00  ?  Pack years: 0.50  ?  Types:  Cigarettes  ? Smokeless tobacco: Never  ? Tobacco comments:  ?  0.5 PP2D  ?Vaping Use  ? Vaping Use: Never used  ?Substance Use Topics  ? Alcohol use: Not Currently  ?  Comment: OCCASIONAL  ? Drug use: No  ? ? ? ?Allergies   ?Patient has no known allergies. ? ? ?Review of Systems ?Review of Systems ?Per HPI ? ?Physical Exam ?Triage Vital Signs ?ED Triage Vitals  ?Enc Vitals Group  ?   BP 04/04/22 1226 (!) 151/91  ?   Pulse Rate 04/04/22 1226 71  ?   Resp 04/04/22 1226 18  ?   Temp 04/04/22 1226 98.7 ?F (37.1 ?C)  ?   Temp Source 04/04/22 1226 Oral  ?   SpO2 04/04/22 1226 95 %  ?   Weight --   ?   Height --   ?   Head Circumference --   ?   Peak Flow --   ?   Pain Score 04/04/22 1227 0  ?    Pain Loc --   ?   Pain Edu? --   ?   Excl. in [place]? --   ? ?No data found. ? ?Updated Vital Signs ?BP (!) 151/91 (BP Location: Right Arm)   Pulse 71   Temp 98.7 ?F (37.1 ?C) (Oral)   Resp 18   SpO2 95%  ? ?Visual Acuity ?Right Eye Distance:   ?Left Eye Distance:   ?Bilateral Distance:   ? ?Right Eye Near:   ?Left Eye Near:    ?Bilateral Near:    ? ?Physical Exam ?Vitals and nursing note reviewed.  ?Constitutional:   ?   General: He is not in acute distress. ?   Appearance: Normal appearance. He is not toxic-appearing.  ?HENT:  ?   Head: Normocephalic and atraumatic.  ?   Right Ear: Tympanic membrane, ear canal and external ear normal. There is no impacted cerumen.  ?   Left Ear: Tympanic membrane, ear canal and external ear normal. There is no impacted cerumen.  ?   Nose: Congestion and rhinorrhea present.  ?   Right Sinus: No maxillary sinus tenderness or frontal sinus tenderness.  ?   Left Sinus: No maxillary sinus tenderness or frontal sinus tenderness.  ?   Mouth/Throat:  ?   Mouth: Mucous membranes are moist.  ?   Pharynx: Oropharynx is clear. Posterior oropharyngeal erythema present.  ?   Comments: Cobblestoning of posterior pharynx ?Eyes:  ?   General: No scleral icterus.    ?   Right eye: No discharge.     ?   Left eye: No discharge.  ?   Extraocular Movements: Extraocular movements intact.  ?Cardiovascular:  ?   Rate and Rhythm: Normal rate and regular rhythm.  ?Pulmonary:  ?   Effort: Pulmonary effort is normal. No respiratory distress.  ?   Breath sounds: Normal breath sounds. No wheezing, rhonchi or rales.  ?Musculoskeletal:  ?   Cervical back: Normal range of motion.  ?Lymphadenopathy:  ?   Cervical: No cervical adenopathy.  ?Skin: ?   General: Skin is warm and dry.  ?   Capillary Refill: Capillary refill takes less than 2 seconds.  ?   Coloration: Skin is not jaundiced or pale.  ?   Findings: No erythema or rash.  ?Neurological:  ?   Mental Status: He is alert and oriented to person, place, and time.   ?   Motor: No weakness.  ?   Gait:  Gait normal.  ? ? ? ?UC Treatments / Results  ?Labs ?(all labs ordered are listed, but only abnormal results are displayed) ?Labs Reviewed - No data to display ? ?EKG ? ? ?Radiology ?No results found. ? ?Procedures ?Procedures (including critical care time) ? ?Medications Ordered in UC ?Medications - No data to display ? ?Initial Impression / Assessment and Plan / UC Course  ?I have reviewed the triage vital signs and the nursing notes. ? ?Pertinent labs & imaging results that were available during my care of the patient were reviewed by me and considered in my medical decision making (see chart for details). ? ?  ?No evidence of acute infection today.  Switch cetirizine to levocetirizine.  Start Flonase nasal spray 1-2 times daily.  Also discussed running a humidifier and and using saline nasal rinses.  Seek care if symptoms persist or worsen despite treatment.   ?Final Clinical Impressions(s) / UC Diagnoses  ? ?Final diagnoses:  ?Seasonal allergic rhinitis due to pollen  ? ? ? ?Discharge Instructions   ? ?  ?- You were seen today for allergic rhinitis ?-Please switch from Zyrtec to Xyzal; I have sent a prescription for Xyzal to your pharmacy ?-Please start using Flonase 2 sprays daily regularly ?-Continue over-the-counter eyedrops ?-Please start nasal saline rinses that we discussed ?- Follow up with PCP if your symptoms do not improve with this treatment ? ? ? ? ?ED Prescriptions   ? ? Medication Sig Dispense Auth. Provider  ? fluticasone (FLONASE) 50 MCG/ACT nasal spray Place 1 spray into both nostrils daily. 16 g Noemi Chapel A, NP  ? levocetirizine (XYZAL) 5 MG tablet Take 1 tablet (5 mg total) by mouth every evening. 30 tablet Eulogio Bear, NP  ? ?  ? ?PDMP not reviewed this encounter. ?  ?Eulogio Bear, NP ?04/04/22 1256 ? ?

## 2022-04-05 ENCOUNTER — Telehealth: Payer: Self-pay | Admitting: *Deleted

## 2022-04-05 NOTE — Telephone Encounter (Signed)
Called patient to remind of sim appt. for 04-07-22- arrival time- 2:45 pm @ [place], patient informed to arrive with a full bladder and an empty bowel, lvm for a return call ?

## 2022-04-07 ENCOUNTER — Ambulatory Visit
Admission: RE | Admit: 2022-04-07 | Discharge: 2022-04-07 | Disposition: A | Discharge status: Home / Self Care | Payer: 59 | Source: Ambulatory Visit | Attending: Radiation Oncology | Admitting: Radiation Oncology

## 2022-04-07 ENCOUNTER — Other Ambulatory Visit: Payer: Self-pay

## 2022-04-07 DIAGNOSIS — Z510 Encounter for antineoplastic radiation therapy: Secondary | ICD-10-CM | POA: Insufficient documentation

## 2022-04-07 DIAGNOSIS — C61 Malignant neoplasm of prostate: Secondary | ICD-10-CM | POA: Insufficient documentation

## 2022-04-07 NOTE — Progress Notes (Signed)
?  Radiation Oncology         [phone] ?________________________________ ? ?Name: Erik Gill MRN: [id number]  ?Date: 04/07/2022  DOB: Nov 17, 1961 ? ?SIMULATION AND TREATMENT PLANNING NOTE ? ?  ICD-10-CM   ?1. Malignant neoplasm of prostate ([place])  C61   ?  ? ? ?DIAGNOSIS:  61 y.o. gentleman with adverse pathology and detectable, postoperative PSA at 0.22, s/p RALP 11/2021 for stage pT3bN1, locally advanced, Gleason 3+5 adenocarcinoma of the prostate. ? ?NARRATIVE:  The patient was brought to the [facility].  Identity was confirmed.  All relevant records and images related to the planned course of therapy were reviewed.  The patient freely provided informed written consent to proceed with treatment after reviewing the details related to the planned course of therapy. The consent form was witnessed and verified by the simulation staff.  Then, the patient was set-up in a stable reproducible supine position for radiation therapy.  A vacuum lock pillow device was custom fabricated to position his legs in a reproducible immobilized position.  Then, I performed a urethrogram under sterile conditions to identify the prostatic bed.  CT images were obtained.  Surface markings were placed.  The CT images were loaded into the planning software.  Then the prostate bed target, pelvic lymph node target and avoidance structures including the rectum, bladder, bowel and hips were contoured.  Treatment planning then occurred.  The radiation prescription was entered and confirmed.  A total of one complex treatment devices were fabricated. I have requested : Intensity Modulated Radiotherapy (IMRT) is medically necessary for this case for the following reason:  Rectal sparing.. ? ?PLAN:  The patient will receive 45 Gy in 25 fractions of 1.8 Gy, followed by a boost to the prostate bed to a total dose of 68.4 Gy with 13 additional fractions of 1.8 Gy. ? ? ?________________________________ ? ?Sheral Apley Tammi Klippel, M.D. ? ?

## 2022-04-10 DIAGNOSIS — Z510 Encounter for antineoplastic radiation therapy: Secondary | ICD-10-CM | POA: Diagnosis not present

## 2022-04-12 ENCOUNTER — Ambulatory Visit
Admission: EM | Admit: 2022-04-12 | Discharge: 2022-04-12 | Disposition: A | Discharge status: Home / Self Care | Payer: 59 | Attending: Physician Assistant | Admitting: Physician Assistant

## 2022-04-12 ENCOUNTER — Ambulatory Visit (INDEPENDENT_AMBULATORY_CARE_PROVIDER_SITE_OTHER): Payer: 59

## 2022-04-12 ENCOUNTER — Encounter: Payer: Self-pay | Admitting: Emergency Medicine

## 2022-04-12 ENCOUNTER — Other Ambulatory Visit: Payer: Self-pay

## 2022-04-12 DIAGNOSIS — R051 Acute cough: Secondary | ICD-10-CM | POA: Diagnosis not present

## 2022-04-12 DIAGNOSIS — J441 Chronic obstructive pulmonary disease with (acute) exacerbation: Secondary | ICD-10-CM | POA: Diagnosis not present

## 2022-04-12 MED ORDER — PREDNISONE 10 MG (21) PO TBPK: ORAL_TABLET | ORAL | 0 refills | Status: DC

## 2022-04-12 MED ORDER — ALBUTEROL SULFATE HFA 108 (90 BASE) MCG/ACT IN AERS
2.0000 | INHALATION_SPRAY | Freq: Once | RESPIRATORY_TRACT | Status: AC
  Administered 2022-04-12: 2 via RESPIRATORY_TRACT

## 2022-04-12 MED ORDER — DOXYCYCLINE HYCLATE 100 MG PO CAPS: 100.0000 mg | ORAL_CAPSULE | Freq: Two times a day (BID) | ORAL | 0 refills | Status: DC

## 2022-04-12 MED ORDER — BENZONATATE 100 MG PO CAPS: 100.0000 mg | ORAL_CAPSULE | Freq: Three times a day (TID) | ORAL | 0 refills | Status: DC

## 2022-04-12 NOTE — ED Triage Notes (Signed)
Pt here for cough and sneezing x several weeks; pt thinks is allergy related; seen Tuesday for same  ?

## 2022-04-12 NOTE — ED Provider Notes (Signed)
?[facility] ? ? ? ?CSN: [id number] ?Arrival date & time: 04/12/22  1407 ? ? ?  ? ?History   ?Chief Complaint ?Chief Complaint  ?Patient presents with  ? Cough  ? ? ?HPI ?E[facility] is a 61 y.o. male.  ? ?Patient presents today with a several week history of cough.  He was seen by our clinic 04/04/2022 at which point symptoms were attributed to allergies and he was started on levocetirizine.  He reports take medication as prescribed without missing doses but has not had any improvement of symptoms.  Denies any recent antibiotic or steroid use.  He denies any known sick contacts.  He is a current everyday smoker smoking approximately 0.75 packs/day.  He denies formal diagnosis of COPD or asthma.  He reports that symptoms are worse at night and has noticed some wheezing. ? ? ?Past Medical History:  ?Diagnosis Date  ? Elevated PSA   ? GERD (gastroesophageal reflux disease)   ? Headache   ? sinus related  ? Hypertension   ? Nocturia   ? Prostate cancer ([place])   ? Wears glasses   ? ? ?Patient Active Problem List  ? Diagnosis Date Noted  ? Family history of prostate cancer 03/01/2022  ? Genetic testing 03/01/2022  ? Elevated PSA 11/05/2021  ? Malignant neoplasm of prostate ([place]) 10/08/2017  ? Sinusitis, chronic 07/15/2016  ? Smoker 07/15/2016  ? Essential hypertension 07/15/2016  ? ? ?Past Surgical History:  ?Procedure Laterality Date  ? HERNIA REPAIR Right   ? LYMPHADENECTOMY Bilateral 11/14/2021  ? Procedure: LYMPHADENECTOMY, PELVIC;  Surgeon: Raynelle Bring, MD;  Location: [facility];  Service: Urology;  Laterality: Bilateral;  ? PROSTATE BIOPSY N/A 08/29/2017  ? Procedure: BIOPSY TRANSRECTAL ULTRASONIC PROSTATE (TUBP);  Surgeon: Ceasar Mons, MD;  Location: [facility];  Service: Urology;  Laterality: N/A;  ? ROBOT ASSISTED LAPAROSCOPIC RADICAL PROSTATECTOMY N/A 11/14/2021  ? Procedure: XI ROBOTIC ASSISTED LAPAROSCOPIC RADICAL PROSTATECTOMY LEVEL 1;  Surgeon: Raynelle Bring, MD;   Location: [facility];  Service: Urology;  Laterality: N/A;  ? ? ? ? ? ?Home Medications   ? ?Prior to Admission medications   ?Medication Sig Start Date End Date Taking? Authorizing Provider  ?benzonatate (TESSALON) 100 MG capsule Take 1 capsule (100 mg total) by mouth every 8 (eight) hours. 04/12/22  Yes Gricelda Foland, Junie Panning K, PA-C  ?doxycycline (VIBRAMYCIN) 100 MG capsule Take 1 capsule (100 mg total) by mouth 2 (two) times daily. 04/12/22  Yes Amilah Greenspan K, PA-C  ?predniSONE (STERAPRED UNI-PAK 21 TAB) 10 MG (21) TBPK tablet As directed 04/12/22  Yes Rasool Rommel K, PA-C  ?amLODipine-benazepril (LOTREL) 10-40 MG capsule Take 1 capsule by mouth daily. 09/28/16   [provider]  ?ASPIRIN PO Take 81 mg by mouth.    [provider]  ?docusate sodium (COLACE) 100 MG capsule Take 1 capsule (100 mg total) by mouth 2 (two) times daily. ?Patient not taking: Reported on 02/28/2022 11/14/21   Debbrah Alar, PA-C  ?fluticasone (FLONASE) 50 MCG/ACT nasal spray Place 1 spray into both nostrils daily. 04/04/22   Eulogio Bear, NP  ?levocetirizine (XYZAL) 5 MG tablet Take 1 tablet (5 mg total) by mouth every evening. 04/04/22   Eulogio Bear, NP  ?metoprolol succinate (TOPROL-XL) 25 MG 24 hr tablet Take 25 mg by mouth daily.    [provider]  ?Naphazoline-Pheniramine (OPCON-A) 0.027-0.315 % SOLN Place 1 drop into both eyes daily as needed (allergies). ?Patient not taking: Reported on 02/28/2022  [provider]  ? ? ?Family History ?Family History  ?Problem Relation Age of Onset  ? Cancer Father   ?     colon cancer  ? Cancer Brother   ?     prostate  ? Cancer Brother   ?     prostate  ? Cancer Cousin   ?     prostate cancer/maternal first cousin  ? ? ?Social History ?Social History  ? ?Tobacco Use  ? Smoking status: Every Day  ?  Packs/day: 0.50  ?  Years: 1.00  ?  Pack years: 0.50  ?  Types: Cigarettes  ? Smokeless tobacco: Never  ? Tobacco comments:  ?  0.5 PP2D  ?Vaping Use  ? Vaping Use:  Never used  ?Substance Use Topics  ? Alcohol use: Not Currently  ?  Comment: OCCASIONAL  ? Drug use: No  ? ? ? ?Allergies   ?Patient has no known allergies. ? ? ?Review of Systems ?Review of Systems  ?Constitutional:  Positive for activity change. Negative for appetite change, fatigue and fever.  ?HENT:  Positive for congestion. Negative for sinus pressure, sneezing and sore throat.   ?Respiratory:  Positive for cough and shortness of breath.   ?Cardiovascular:  Negative for chest pain.  ?Gastrointestinal:  Negative for abdominal pain, diarrhea, nausea and vomiting.  ?Neurological:  Negative for dizziness, light-headedness and headaches.  ? ? ?Physical Exam ?Triage Vital Signs ?ED Triage Vitals [04/12/22 1517]  ?Enc Vitals Group  ?   BP 132/68  ?   Pulse Rate 69  ?   Resp 18  ?   Temp 97.9 ?F (36.6 ?C)  ?   Temp Source Oral  ?   SpO2 98 %  ?   Weight   ?   Height   ?   Head Circumference   ?   Peak Flow   ?   Pain Score 0  ?   Pain Loc   ?   Pain Edu?   ?   Excl. in [place]?   ? ?No data found. ? ?Updated Vital Signs ?BP 132/68 (BP Location: Left Arm)   Pulse 69   Temp 97.9 ?F (36.6 ?C) (Oral)   Resp 18   SpO2 98%  ? ?Visual Acuity ?Right Eye Distance:   ?Left Eye Distance:   ?Bilateral Distance:   ? ?Right Eye Near:   ?Left Eye Near:    ?Bilateral Near:    ? ?Physical Exam ?Vitals reviewed.  ?Constitutional:   ?   General: He is awake.  ?   Appearance: Normal appearance. He is well-developed. He is not ill-appearing.  ?   Comments: Very pleasant male appears stated age in no acute distress sitting comfortably in exam room  ?HENT:  ?   Head: Normocephalic and atraumatic.  ?   Right Ear: Ear canal and external ear normal. A middle ear effusion is present. Tympanic membrane is not erythematous or bulging.  ?   Left Ear: Ear canal and external ear normal. A middle ear effusion is present. Tympanic membrane is not erythematous or bulging.  ?   Nose: Nose normal.  ?   Mouth/Throat:  ?   Pharynx: Uvula midline. No  oropharyngeal exudate, posterior oropharyngeal erythema or uvula swelling.  ?Cardiovascular:  ?   Rate and Rhythm: Normal rate and regular rhythm.  ?   Heart sounds: Normal heart sounds, S1 normal and S2 normal. No murmur heard. ?Pulmonary:  ?   Effort: Pulmonary effort is  normal. No accessory muscle usage or respiratory distress.  ?   Breath sounds: No stridor. Wheezing and rhonchi present. No rales.  ?   Comments: Scattered wheezing and rhonchi ?Abdominal:  ?   General: Bowel sounds are normal.  ?   Palpations: Abdomen is soft.  ?   Tenderness: There is no abdominal tenderness.  ?Neurological:  ?   Mental Status: He is alert.  ?Psychiatric:     ?   Behavior: Behavior is cooperative.  ? ? ? ?UC Treatments / Results  ?Labs ?(all labs ordered are listed, but only abnormal results are displayed) ?Labs Reviewed - No data to display ? ?EKG ? ? ?Radiology ?DG Chest 2 View ? ?Result Date: 04/12/2022 ?CLINICAL DATA:  Cough for several weeks. EXAM: CHEST - 2 VIEW COMPARISON:  02/14/2021 FINDINGS: The cardiac silhouette, mediastinal and hilar contours are normal. The lungs are clear. No pleural effusions or pulmonary lesions. The bony thorax is intact. IMPRESSION: No acute cardiopulmonary findings. Electronically Signed   By: Marijo Sanes M.D.   On: 04/12/2022 15:38   ? ?Procedures ?Procedures (including critical care time) ? ?Medications Ordered in UC ?Medications  ?albuterol (VENTOLIN HFA) 108 (90 Base) MCG/ACT inhaler 2 puff (2 puffs Inhalation Given 04/12/22 1540)  ? ? ?Initial Impression / Assessment and Plan / UC Course  ?I have reviewed the triage vital signs and the nursing notes. ? ?Pertinent labs & imaging results that were available during my care of the patient were reviewed by me and considered in my medical decision making (see chart for details). ? ?  ? ?Chest x-ray obtained given worsening cough showed no acute cardiopulmonary disease.  Suspect COPD flare of undiagnosed COPD given prolonged history of smoking  and clinical presentation.  Patient was given albuterol with improvement but not resolution of symptoms.  He was sent home with albuterol inhaler with instruction to use this every 4-6 hours as needed for shortness of

## 2022-04-12 NOTE — Discharge Instructions (Signed)
Your chest x-ray was normal.  I believe that you may be developing COPD related to smoking.  Use albuterol inhaler every 4-6 hours as needed.  Start doxycycline 100 mg twice daily for 10 days.  Stay in the sun while on this medication.  Use Mucinex, Flonase, Tylenol for symptom relief.  Start prednisone taper as prescribed.  Do not take NSAIDs including aspirin, ibuprofen/Advil, naproxen/Aleve with this medication as it can cause stomach bleeding.  Make sure you rest and drink plenty of fluid.  Use Tessalon for cough.  If your symptoms are not improving within a few days please return for reevaluation.  If anything worsens and you have chest pain, nausea/vomiting, shortness of breath, worsening cough you should be seen immediately. ?

## 2022-04-19 ENCOUNTER — Ambulatory Visit
Admission: RE | Admit: 2022-04-19 | Discharge: 2022-04-19 | Disposition: A | Discharge status: Home / Self Care | Payer: 59 | Source: Ambulatory Visit | Attending: Radiation Oncology | Admitting: Radiation Oncology

## 2022-04-19 ENCOUNTER — Other Ambulatory Visit: Payer: Self-pay

## 2022-04-19 DIAGNOSIS — Z510 Encounter for antineoplastic radiation therapy: Secondary | ICD-10-CM | POA: Diagnosis not present

## 2022-04-19 LAB — RAD ONC ARIA SESSION SUMMARY
Course Elapsed Days: 0
Plan Fractions Treated to Date: 1
Plan Prescribed Dose Per Fraction: 1.8 Gy
Plan Total Fractions Prescribed: 25
Plan Total Prescribed Dose: 45 Gy
Reference Point Dosage Given to Date: 1.8 Gy
Reference Point Session Dosage Given: 1.8 Gy
Session Number: 1

## 2022-04-20 ENCOUNTER — Ambulatory Visit
Admission: RE | Admit: 2022-04-20 | Discharge: 2022-04-20 | Disposition: A | Discharge status: Home / Self Care | Payer: 59 | Source: Ambulatory Visit | Attending: Radiation Oncology | Admitting: Radiation Oncology

## 2022-04-20 ENCOUNTER — Other Ambulatory Visit: Payer: Self-pay

## 2022-04-20 DIAGNOSIS — Z510 Encounter for antineoplastic radiation therapy: Secondary | ICD-10-CM | POA: Diagnosis not present

## 2022-04-20 LAB — RAD ONC ARIA SESSION SUMMARY
Course Elapsed Days: 1
Plan Fractions Treated to Date: 2
Plan Prescribed Dose Per Fraction: 1.8 Gy
Plan Total Fractions Prescribed: 25
Plan Total Prescribed Dose: 45 Gy
Reference Point Dosage Given to Date: 3.6 Gy
Reference Point Session Dosage Given: 1.8 Gy
Session Number: 2

## 2022-04-21 ENCOUNTER — Other Ambulatory Visit: Payer: Self-pay

## 2022-04-21 ENCOUNTER — Ambulatory Visit
Admission: RE | Admit: 2022-04-21 | Discharge: 2022-04-21 | Disposition: A | Discharge status: Home / Self Care | Payer: 59 | Source: Ambulatory Visit | Attending: Radiation Oncology | Admitting: Radiation Oncology

## 2022-04-21 ENCOUNTER — Other Ambulatory Visit: Payer: Self-pay | Admitting: Radiation Oncology

## 2022-04-21 DIAGNOSIS — Z510 Encounter for antineoplastic radiation therapy: Secondary | ICD-10-CM | POA: Diagnosis not present

## 2022-04-21 LAB — RAD ONC ARIA SESSION SUMMARY
Course Elapsed Days: 2
Plan Fractions Treated to Date: 3
Plan Prescribed Dose Per Fraction: 1.8 Gy
Plan Total Fractions Prescribed: 25
Plan Total Prescribed Dose: 45 Gy
Reference Point Dosage Given to Date: 5.4 Gy
Reference Point Session Dosage Given: 1.8 Gy
Session Number: 3

## 2022-04-21 MED ORDER — MEGESTROL ACETATE 20 MG PO TABS: 20.0000 mg | ORAL_TABLET | Freq: Two times a day (BID) | ORAL | 5 refills | Status: DC

## 2022-04-24 ENCOUNTER — Other Ambulatory Visit: Payer: Self-pay

## 2022-04-24 ENCOUNTER — Ambulatory Visit
Admission: RE | Admit: 2022-04-24 | Discharge: 2022-04-24 | Disposition: A | Discharge status: Home / Self Care | Payer: 59 | Source: Ambulatory Visit | Attending: Radiation Oncology | Admitting: Radiation Oncology

## 2022-04-24 DIAGNOSIS — Z510 Encounter for antineoplastic radiation therapy: Secondary | ICD-10-CM | POA: Diagnosis not present

## 2022-04-24 LAB — RAD ONC ARIA SESSION SUMMARY
Course Elapsed Days: 5
Plan Fractions Treated to Date: 4
Plan Prescribed Dose Per Fraction: 1.8 Gy
Plan Total Fractions Prescribed: 25
Plan Total Prescribed Dose: 45 Gy
Reference Point Dosage Given to Date: 7.2 Gy
Reference Point Session Dosage Given: 1.8 Gy
Session Number: 4

## 2022-04-25 ENCOUNTER — Ambulatory Visit
Admission: RE | Admit: 2022-04-25 | Discharge: 2022-04-25 | Disposition: A | Discharge status: Home / Self Care | Payer: 59 | Source: Ambulatory Visit | Attending: Radiation Oncology | Admitting: Radiation Oncology

## 2022-04-25 ENCOUNTER — Other Ambulatory Visit: Payer: Self-pay

## 2022-04-25 DIAGNOSIS — Z510 Encounter for antineoplastic radiation therapy: Secondary | ICD-10-CM | POA: Diagnosis not present

## 2022-04-25 LAB — RAD ONC ARIA SESSION SUMMARY
Course Elapsed Days: 6
Plan Fractions Treated to Date: 5
Plan Prescribed Dose Per Fraction: 1.8 Gy
Plan Total Fractions Prescribed: 25
Plan Total Prescribed Dose: 45 Gy
Reference Point Dosage Given to Date: 9 Gy
Reference Point Session Dosage Given: 1.8 Gy
Session Number: 5

## 2022-04-26 ENCOUNTER — Ambulatory Visit
Admission: RE | Admit: 2022-04-26 | Discharge: 2022-04-26 | Disposition: A | Discharge status: Home / Self Care | Payer: 59 | Source: Ambulatory Visit | Attending: Radiation Oncology | Admitting: Radiation Oncology

## 2022-04-26 ENCOUNTER — Other Ambulatory Visit: Payer: Self-pay

## 2022-04-26 DIAGNOSIS — Z510 Encounter for antineoplastic radiation therapy: Secondary | ICD-10-CM | POA: Diagnosis not present

## 2022-04-26 LAB — RAD ONC ARIA SESSION SUMMARY
Course Elapsed Days: 7
Plan Fractions Treated to Date: 6
Plan Prescribed Dose Per Fraction: 1.8 Gy
Plan Total Fractions Prescribed: 25
Plan Total Prescribed Dose: 45 Gy
Reference Point Dosage Given to Date: 10.8 Gy
Reference Point Session Dosage Given: 1.8 Gy
Session Number: 6

## 2022-04-27 ENCOUNTER — Other Ambulatory Visit: Payer: Self-pay

## 2022-04-27 ENCOUNTER — Ambulatory Visit
Admission: RE | Admit: 2022-04-27 | Discharge: 2022-04-27 | Disposition: A | Discharge status: Home / Self Care | Payer: 59 | Source: Ambulatory Visit | Attending: Radiation Oncology | Admitting: Radiation Oncology

## 2022-04-27 DIAGNOSIS — Z510 Encounter for antineoplastic radiation therapy: Secondary | ICD-10-CM | POA: Diagnosis not present

## 2022-04-27 LAB — RAD ONC ARIA SESSION SUMMARY
Course Elapsed Days: 8
Plan Fractions Treated to Date: 7
Plan Prescribed Dose Per Fraction: 1.8 Gy
Plan Total Fractions Prescribed: 25
Plan Total Prescribed Dose: 45 Gy
Reference Point Dosage Given to Date: 12.6 Gy
Reference Point Session Dosage Given: 1.8 Gy
Session Number: 7

## 2022-04-28 ENCOUNTER — Other Ambulatory Visit: Payer: Self-pay

## 2022-04-28 ENCOUNTER — Ambulatory Visit
Admission: RE | Admit: 2022-04-28 | Discharge: 2022-04-28 | Disposition: A | Discharge status: Home / Self Care | Payer: 59 | Source: Ambulatory Visit | Attending: Radiation Oncology | Admitting: Radiation Oncology

## 2022-04-28 DIAGNOSIS — Z510 Encounter for antineoplastic radiation therapy: Secondary | ICD-10-CM | POA: Diagnosis not present

## 2022-04-28 LAB — RAD ONC ARIA SESSION SUMMARY
Course Elapsed Days: 9
Plan Fractions Treated to Date: 8
Plan Prescribed Dose Per Fraction: 1.8 Gy
Plan Total Fractions Prescribed: 25
Plan Total Prescribed Dose: 45 Gy
Reference Point Dosage Given to Date: 14.4 Gy
Reference Point Session Dosage Given: 1.8 Gy
Session Number: 8

## 2022-05-02 ENCOUNTER — Other Ambulatory Visit: Payer: Self-pay

## 2022-05-02 ENCOUNTER — Ambulatory Visit
Admission: RE | Admit: 2022-05-02 | Discharge: 2022-05-02 | Disposition: A | Discharge status: Home / Self Care | Payer: 59 | Source: Ambulatory Visit | Attending: Radiation Oncology | Admitting: Radiation Oncology

## 2022-05-02 DIAGNOSIS — Z510 Encounter for antineoplastic radiation therapy: Secondary | ICD-10-CM | POA: Diagnosis not present

## 2022-05-02 LAB — RAD ONC ARIA SESSION SUMMARY
Course Elapsed Days: 13
Plan Fractions Treated to Date: 9
Plan Prescribed Dose Per Fraction: 1.8 Gy
Plan Total Fractions Prescribed: 25
Plan Total Prescribed Dose: 45 Gy
Reference Point Dosage Given to Date: 16.2 Gy
Reference Point Session Dosage Given: 1.8 Gy
Session Number: 9

## 2022-05-03 ENCOUNTER — Ambulatory Visit
Admission: RE | Admit: 2022-05-03 | Discharge: 2022-05-03 | Disposition: A | Discharge status: Home / Self Care | Payer: 59 | Source: Ambulatory Visit | Attending: Radiation Oncology | Admitting: Radiation Oncology

## 2022-05-03 ENCOUNTER — Other Ambulatory Visit: Payer: Self-pay

## 2022-05-03 DIAGNOSIS — Z510 Encounter for antineoplastic radiation therapy: Secondary | ICD-10-CM | POA: Diagnosis not present

## 2022-05-03 LAB — RAD ONC ARIA SESSION SUMMARY
Course Elapsed Days: 14
Plan Fractions Treated to Date: 10
Plan Prescribed Dose Per Fraction: 1.8 Gy
Plan Total Fractions Prescribed: 25
Plan Total Prescribed Dose: 45 Gy
Reference Point Dosage Given to Date: 18 Gy
Reference Point Session Dosage Given: 1.8 Gy
Session Number: 10

## 2022-05-04 ENCOUNTER — Other Ambulatory Visit: Payer: Self-pay

## 2022-05-04 ENCOUNTER — Ambulatory Visit
Admission: RE | Admit: 2022-05-04 | Discharge: 2022-05-04 | Disposition: A | Discharge status: Home / Self Care | Payer: 59 | Source: Ambulatory Visit | Attending: Radiation Oncology | Admitting: Radiation Oncology

## 2022-05-04 DIAGNOSIS — C61 Malignant neoplasm of prostate: Secondary | ICD-10-CM | POA: Insufficient documentation

## 2022-05-04 DIAGNOSIS — Z510 Encounter for antineoplastic radiation therapy: Secondary | ICD-10-CM | POA: Insufficient documentation

## 2022-05-04 LAB — RAD ONC ARIA SESSION SUMMARY
Course Elapsed Days: 15
Plan Fractions Treated to Date: 11
Plan Prescribed Dose Per Fraction: 1.8 Gy
Plan Total Fractions Prescribed: 25
Plan Total Prescribed Dose: 45 Gy
Reference Point Dosage Given to Date: 19.8 Gy
Reference Point Session Dosage Given: 1.8 Gy
Session Number: 11

## 2022-05-05 ENCOUNTER — Ambulatory Visit
Admission: RE | Admit: 2022-05-05 | Discharge: 2022-05-05 | Disposition: A | Discharge status: Home / Self Care | Payer: 59 | Source: Ambulatory Visit | Attending: Radiation Oncology | Admitting: Radiation Oncology

## 2022-05-05 ENCOUNTER — Other Ambulatory Visit: Payer: Self-pay

## 2022-05-05 DIAGNOSIS — Z510 Encounter for antineoplastic radiation therapy: Secondary | ICD-10-CM | POA: Diagnosis not present

## 2022-05-05 LAB — RAD ONC ARIA SESSION SUMMARY
Course Elapsed Days: 16
Plan Fractions Treated to Date: 12
Plan Prescribed Dose Per Fraction: 1.8 Gy
Plan Total Fractions Prescribed: 25
Plan Total Prescribed Dose: 45 Gy
Reference Point Dosage Given to Date: 21.6 Gy
Reference Point Session Dosage Given: 1.8 Gy
Session Number: 12

## 2022-05-08 ENCOUNTER — Other Ambulatory Visit: Payer: Self-pay

## 2022-05-08 ENCOUNTER — Ambulatory Visit
Admission: RE | Admit: 2022-05-08 | Discharge: 2022-05-08 | Disposition: A | Discharge status: Home / Self Care | Payer: 59 | Source: Ambulatory Visit | Attending: Radiation Oncology | Admitting: Radiation Oncology

## 2022-05-08 DIAGNOSIS — Z510 Encounter for antineoplastic radiation therapy: Secondary | ICD-10-CM | POA: Diagnosis not present

## 2022-05-08 LAB — RAD ONC ARIA SESSION SUMMARY
Course Elapsed Days: 19
Plan Fractions Treated to Date: 13
Plan Prescribed Dose Per Fraction: 1.8 Gy
Plan Total Fractions Prescribed: 25
Plan Total Prescribed Dose: 45 Gy
Reference Point Dosage Given to Date: 23.4 Gy
Reference Point Session Dosage Given: 1.8 Gy
Session Number: 13

## 2022-05-09 ENCOUNTER — Ambulatory Visit
Admission: RE | Admit: 2022-05-09 | Discharge: 2022-05-09 | Disposition: A | Discharge status: Home / Self Care | Payer: 59 | Source: Ambulatory Visit | Attending: Radiation Oncology | Admitting: Radiation Oncology

## 2022-05-09 ENCOUNTER — Other Ambulatory Visit: Payer: Self-pay

## 2022-05-09 DIAGNOSIS — Z510 Encounter for antineoplastic radiation therapy: Secondary | ICD-10-CM | POA: Diagnosis not present

## 2022-05-09 LAB — RAD ONC ARIA SESSION SUMMARY
Course Elapsed Days: 20
Plan Fractions Treated to Date: 14
Plan Prescribed Dose Per Fraction: 1.8 Gy
Plan Total Fractions Prescribed: 25
Plan Total Prescribed Dose: 45 Gy
Reference Point Dosage Given to Date: 25.2 Gy
Reference Point Session Dosage Given: 1.8 Gy
Session Number: 14

## 2022-05-10 ENCOUNTER — Other Ambulatory Visit: Payer: Self-pay

## 2022-05-10 ENCOUNTER — Ambulatory Visit
Admission: RE | Admit: 2022-05-10 | Discharge: 2022-05-10 | Disposition: A | Discharge status: Home / Self Care | Payer: 59 | Source: Ambulatory Visit | Attending: Radiation Oncology | Admitting: Radiation Oncology

## 2022-05-10 DIAGNOSIS — Z510 Encounter for antineoplastic radiation therapy: Secondary | ICD-10-CM | POA: Diagnosis not present

## 2022-05-10 LAB — RAD ONC ARIA SESSION SUMMARY
Course Elapsed Days: 21
Plan Fractions Treated to Date: 15
Plan Prescribed Dose Per Fraction: 1.8 Gy
Plan Total Fractions Prescribed: 25
Plan Total Prescribed Dose: 45 Gy
Reference Point Dosage Given to Date: 27 Gy
Reference Point Session Dosage Given: 1.8 Gy
Session Number: 15

## 2022-05-11 ENCOUNTER — Other Ambulatory Visit: Payer: Self-pay

## 2022-05-11 ENCOUNTER — Ambulatory Visit
Admission: RE | Admit: 2022-05-11 | Discharge: 2022-05-11 | Disposition: A | Discharge status: Home / Self Care | Payer: 59 | Source: Ambulatory Visit | Attending: Radiation Oncology | Admitting: Radiation Oncology

## 2022-05-11 DIAGNOSIS — Z510 Encounter for antineoplastic radiation therapy: Secondary | ICD-10-CM | POA: Diagnosis not present

## 2022-05-11 LAB — RAD ONC ARIA SESSION SUMMARY
Course Elapsed Days: 22
Plan Fractions Treated to Date: 16
Plan Prescribed Dose Per Fraction: 1.8 Gy
Plan Total Fractions Prescribed: 25
Plan Total Prescribed Dose: 45 Gy
Reference Point Dosage Given to Date: 28.8 Gy
Reference Point Session Dosage Given: 1.8 Gy
Session Number: 16

## 2022-05-12 ENCOUNTER — Other Ambulatory Visit: Payer: Self-pay

## 2022-05-12 ENCOUNTER — Ambulatory Visit
Admission: RE | Admit: 2022-05-12 | Discharge: 2022-05-12 | Disposition: A | Discharge status: Home / Self Care | Payer: 59 | Source: Ambulatory Visit | Attending: Radiation Oncology | Admitting: Radiation Oncology

## 2022-05-12 DIAGNOSIS — Z510 Encounter for antineoplastic radiation therapy: Secondary | ICD-10-CM | POA: Diagnosis not present

## 2022-05-12 LAB — RAD ONC ARIA SESSION SUMMARY
Course Elapsed Days: 23
Plan Fractions Treated to Date: 17
Plan Prescribed Dose Per Fraction: 1.8 Gy
Plan Total Fractions Prescribed: 25
Plan Total Prescribed Dose: 45 Gy
Reference Point Dosage Given to Date: 30.6 Gy
Reference Point Session Dosage Given: 1.8 Gy
Session Number: 17

## 2022-05-15 ENCOUNTER — Other Ambulatory Visit: Payer: Self-pay

## 2022-05-15 ENCOUNTER — Ambulatory Visit
Admission: RE | Admit: 2022-05-15 | Discharge: 2022-05-15 | Disposition: A | Discharge status: Home / Self Care | Payer: 59 | Source: Ambulatory Visit | Attending: Radiation Oncology | Admitting: Radiation Oncology

## 2022-05-15 DIAGNOSIS — Z510 Encounter for antineoplastic radiation therapy: Secondary | ICD-10-CM | POA: Diagnosis not present

## 2022-05-15 LAB — RAD ONC ARIA SESSION SUMMARY
Course Elapsed Days: 26
Plan Fractions Treated to Date: 18
Plan Prescribed Dose Per Fraction: 1.8 Gy
Plan Total Fractions Prescribed: 25
Plan Total Prescribed Dose: 45 Gy
Reference Point Dosage Given to Date: 32.4 Gy
Reference Point Session Dosage Given: 1.8 Gy
Session Number: 18

## 2022-05-16 ENCOUNTER — Other Ambulatory Visit: Payer: Self-pay

## 2022-05-16 ENCOUNTER — Ambulatory Visit
Admission: RE | Admit: 2022-05-16 | Discharge: 2022-05-16 | Disposition: A | Discharge status: Home / Self Care | Payer: 59 | Source: Ambulatory Visit | Attending: Radiation Oncology | Admitting: Radiation Oncology

## 2022-05-16 DIAGNOSIS — Z510 Encounter for antineoplastic radiation therapy: Secondary | ICD-10-CM | POA: Diagnosis not present

## 2022-05-16 LAB — RAD ONC ARIA SESSION SUMMARY
Course Elapsed Days: 27
Plan Fractions Treated to Date: 19
Plan Prescribed Dose Per Fraction: 1.8 Gy
Plan Total Fractions Prescribed: 25
Plan Total Prescribed Dose: 45 Gy
Reference Point Dosage Given to Date: 34.2 Gy
Reference Point Session Dosage Given: 1.8 Gy
Session Number: 19

## 2022-05-17 ENCOUNTER — Other Ambulatory Visit: Payer: Self-pay

## 2022-05-17 ENCOUNTER — Ambulatory Visit
Admission: RE | Admit: 2022-05-17 | Discharge: 2022-05-17 | Disposition: A | Discharge status: Home / Self Care | Payer: 59 | Source: Ambulatory Visit | Attending: Radiation Oncology | Admitting: Radiation Oncology

## 2022-05-17 DIAGNOSIS — Z510 Encounter for antineoplastic radiation therapy: Secondary | ICD-10-CM | POA: Diagnosis not present

## 2022-05-17 LAB — RAD ONC ARIA SESSION SUMMARY
Course Elapsed Days: 28
Plan Fractions Treated to Date: 20
Plan Prescribed Dose Per Fraction: 1.8 Gy
Plan Total Fractions Prescribed: 25
Plan Total Prescribed Dose: 45 Gy
Reference Point Dosage Given to Date: 36 Gy
Reference Point Session Dosage Given: 1.8 Gy
Session Number: 20

## 2022-05-18 ENCOUNTER — Ambulatory Visit
Admission: RE | Admit: 2022-05-18 | Discharge: 2022-05-18 | Disposition: A | Discharge status: Home / Self Care | Payer: 59 | Source: Ambulatory Visit | Attending: Radiation Oncology | Admitting: Radiation Oncology

## 2022-05-18 ENCOUNTER — Other Ambulatory Visit: Payer: Self-pay

## 2022-05-18 DIAGNOSIS — Z510 Encounter for antineoplastic radiation therapy: Secondary | ICD-10-CM | POA: Diagnosis not present

## 2022-05-18 LAB — RAD ONC ARIA SESSION SUMMARY
Course Elapsed Days: 29
Plan Fractions Treated to Date: 21
Plan Prescribed Dose Per Fraction: 1.8 Gy
Plan Total Fractions Prescribed: 25
Plan Total Prescribed Dose: 45 Gy
Reference Point Dosage Given to Date: 37.8 Gy
Reference Point Session Dosage Given: 1.8 Gy
Session Number: 21

## 2022-05-19 ENCOUNTER — Ambulatory Visit
Admission: RE | Admit: 2022-05-19 | Discharge: 2022-05-19 | Disposition: A | Discharge status: Home / Self Care | Payer: 59 | Source: Ambulatory Visit | Attending: Radiation Oncology | Admitting: Radiation Oncology

## 2022-05-19 ENCOUNTER — Ambulatory Visit
Admission: EM | Admit: 2022-05-19 | Discharge: 2022-05-19 | Disposition: A | Discharge status: Home / Self Care | Payer: 59 | Attending: Internal Medicine | Admitting: Internal Medicine

## 2022-05-19 ENCOUNTER — Ambulatory Visit (INDEPENDENT_AMBULATORY_CARE_PROVIDER_SITE_OTHER): Payer: 59

## 2022-05-19 ENCOUNTER — Other Ambulatory Visit: Payer: Self-pay

## 2022-05-19 DIAGNOSIS — J069 Acute upper respiratory infection, unspecified: Secondary | ICD-10-CM

## 2022-05-19 DIAGNOSIS — Z510 Encounter for antineoplastic radiation therapy: Secondary | ICD-10-CM | POA: Diagnosis not present

## 2022-05-19 DIAGNOSIS — R059 Cough, unspecified: Secondary | ICD-10-CM | POA: Diagnosis not present

## 2022-05-19 LAB — RAD ONC ARIA SESSION SUMMARY
Course Elapsed Days: 30
Plan Fractions Treated to Date: 22
Plan Prescribed Dose Per Fraction: 1.8 Gy
Plan Total Fractions Prescribed: 25
Plan Total Prescribed Dose: 45 Gy
Reference Point Dosage Given to Date: 39.6 Gy
Reference Point Session Dosage Given: 1.8 Gy
Session Number: 22

## 2022-05-19 MED ORDER — GUAIFENESIN 200 MG PO TABS: 200.0000 mg | ORAL_TABLET | ORAL | 0 refills | Status: DC | PRN

## 2022-05-19 MED ORDER — FLUTICASONE PROPIONATE 50 MCG/ACT NA SUSP: 1.0000 | Freq: Every day | NASAL | 0 refills | Status: DC

## 2022-05-19 NOTE — ED Provider Notes (Signed)
[facility]    CSN: [id number] Arrival date & time: 05/19/22  1527      History   Chief Complaint Chief Complaint  Patient presents with   Cough    HPI Erik [facility] is a 61 y.o. male.   Patient presents with cough and nasal congestion that has been present for approximately 1 week.  Denies any known fevers or sick contacts.  Denies chest pain, shortness of breath, sore throat, ear pain, nausea, vomiting, diarrhea, abdominal pain.  Patient has taken leftover benzonatate and albuterol inhaler with minimal improvement of symptoms.  Patient reports that he used albuterol inhaler for cough and not shortness of breath.  Denies a formal diagnosis of COPD or asthma but reports that he does smoke cigarettes daily.  Was seen approximately 1 month ago for COPD exacerbation and was treated with doxycycline and prednisone.   Cough   Past Medical History:  Diagnosis Date   Elevated PSA    GERD (gastroesophageal reflux disease)    Headache    sinus related   Hypertension    Nocturia    Prostate cancer [facility])    Wears glasses     Patient Active Problem List   Diagnosis Date Noted   Family history of prostate cancer 03/01/2022   Genetic testing 03/01/2022   Elevated PSA 11/05/2021   Malignant neoplasm of prostate ([place]) 10/08/2017   Sinusitis, chronic 07/15/2016   Smoker 07/15/2016   Essential hypertension 07/15/2016    Past Surgical History:  Procedure Laterality Date   HERNIA REPAIR Right    LYMPHADENECTOMY Bilateral 11/14/2021   Procedure: LYMPHADENECTOMY, PELVIC;  Surgeon: Raynelle Bring, MD;  Location: [facility];  Service: Urology;  Laterality: Bilateral;   PROSTATE BIOPSY N/A 08/29/2017   Procedure: BIOPSY TRANSRECTAL ULTRASONIC PROSTATE (TUBP);  Surgeon: Ceasar Mons, MD;  Location: [facility];  Service: Urology;  Laterality: N/A;   ROBOT ASSISTED LAPAROSCOPIC RADICAL PROSTATECTOMY N/A 11/14/2021   Procedure: XI ROBOTIC ASSISTED  LAPAROSCOPIC RADICAL PROSTATECTOMY LEVEL 1;  Surgeon: Raynelle Bring, MD;  Location: [facility];  Service: Urology;  Laterality: N/A;       Home Medications    Prior to Admission medications   Medication Sig Start Date End Date Taking? Authorizing Provider  fluticasone (FLONASE) 50 MCG/ACT nasal spray Place 1 spray into both nostrils daily for 3 days. 05/19/22 05/22/22 Yes Shaquoya Cosper, Michele Rockers, FNP  guaiFENesin 200 MG tablet Take 1 tablet (200 mg total) by mouth every 4 (four) hours as needed for cough or to loosen phlegm. 05/19/22  Yes Deddrick Saindon, Hildred Alamin E, FNP  amLODipine-benazepril (LOTREL) 10-40 MG capsule Take 1 capsule by mouth daily. 09/28/16   [provider]  ASPIRIN PO Take 81 mg by mouth.    [provider]  benzonatate (TESSALON) 100 MG capsule Take 1 capsule (100 mg total) by mouth every 8 (eight) hours. 04/12/22   Raspet, Derry Skill, PA-C  docusate sodium (COLACE) 100 MG capsule Take 1 capsule (100 mg total) by mouth 2 (two) times daily. Patient not taking: Reported on 02/28/2022 11/14/21   Debbrah Alar, PA-C  doxycycline (VIBRAMYCIN) 100 MG capsule Take 1 capsule (100 mg total) by mouth 2 (two) times daily. 04/12/22   Raspet, Derry Skill, PA-C  levocetirizine (XYZAL) 5 MG tablet Take 1 tablet (5 mg total) by mouth every evening. 04/04/22   Eulogio Bear, NP  megestrol (MEGACE) 20 MG tablet Take 1 tablet (20 mg total) by mouth 2 (two) times daily. 04/21/22   Tammi Klippel,  Rodman Key, MD  metoprolol succinate (TOPROL-XL) 25 MG 24 hr tablet Take 25 mg by mouth daily.    [provider]  Naphazoline-Pheniramine (OPCON-A) 0.027-0.315 % SOLN Place 1 drop into both eyes daily as needed (allergies). Patient not taking: Reported on 02/28/2022    [provider]  predniSONE (STERAPRED UNI-PAK 21 TAB) 10 MG (21) TBPK tablet As directed 04/12/22   Raspet, Derry Skill, PA-C    Family History Family History  Problem Relation Age of Onset   Cancer Father        colon cancer   Cancer Brother         prostate   Cancer Brother        prostate   Cancer Cousin        prostate cancer/maternal first cousin    Social History Social History   Tobacco Use   Smoking status: Every Day    Packs/day: 0.50    Years: 1.00    Total pack years: 0.50    Types: Cigarettes   Smokeless tobacco: Never   Tobacco comments:    0.5 PP2D  Vaping Use   Vaping Use: Never used  Substance Use Topics   Alcohol use: Not Currently    Comment: OCCASIONAL   Drug use: No     Allergies   Patient has no known allergies.   Review of Systems Review of Systems Per HPI  Physical Exam Triage Vital Signs ED Triage Vitals  Enc Vitals Group     BP 05/19/22 1538 (!) 156/83     Pulse Rate 05/19/22 1538 72     Resp 05/19/22 1538 18     Temp 05/19/22 1538 98 F (36.7 C)     Temp Source 05/19/22 1538 Oral     SpO2 05/19/22 1538 97 %     Weight --      Height --      Head Circumference --      Peak Flow --      Pain Score 05/19/22 1539 0     Pain Loc --      Pain Edu? --      Excl. in [place]? --    No data found.  Updated Vital Signs BP (!) 156/83 (BP Location: Left Arm)   Pulse 72   Temp 98 F (36.7 C) (Oral)   Resp 18   SpO2 97%   Visual Acuity Right Eye Distance:   Left Eye Distance:   Bilateral Distance:    Right Eye Near:   Left Eye Near:    Bilateral Near:     Physical Exam Constitutional:      General: He is not in acute distress.    Appearance: Normal appearance. He is not toxic-appearing or diaphoretic.  HENT:     Head: Normocephalic and atraumatic.     Right Ear: Tympanic membrane and ear canal normal.     Left Ear: Tympanic membrane and ear canal normal.     Nose: Congestion present.     Mouth/Throat:     Mouth: Mucous membranes are moist.     Pharynx: No posterior oropharyngeal erythema.  Eyes:     Extraocular Movements: Extraocular movements intact.     Conjunctiva/sclera: Conjunctivae normal.     Pupils: Pupils are equal, round, and reactive to light.   Cardiovascular:     Rate and Rhythm: Normal rate and regular rhythm.     Pulses: Normal pulses.     Heart sounds: Normal heart sounds.  Pulmonary:  Effort: Pulmonary effort is normal. No respiratory distress.     Breath sounds: Normal breath sounds. No stridor. No wheezing, rhonchi or rales.  Abdominal:     General: Abdomen is flat. Bowel sounds are normal.     Palpations: Abdomen is soft.  Musculoskeletal:        General: Normal range of motion.     Cervical back: Normal range of motion.  Skin:    General: Skin is warm and dry.  Neurological:     General: No focal deficit present.     Mental Status: He is alert and oriented to person, place, and time. Mental status is at baseline.  Psychiatric:        Mood and Affect: Mood normal.        Behavior: Behavior normal.      UC Treatments / Results  Labs (all labs ordered are listed, but only abnormal results are displayed) Labs Reviewed - No data to display  EKG   Radiology DG Chest 2 View  Result Date: 05/19/2022 CLINICAL DATA:  Cough, allergies EXAM: CHEST - 2 VIEW COMPARISON:  04/12/2022 FINDINGS: Cardiac and mediastinal contours are within normal limits. No focal pulmonary opacity. No pleural effusion or pneumothorax. No acute osseous abnormality. IMPRESSION: No acute cardiopulmonary process. Electronically Signed   By: Merilyn Baba M.D.   On: 05/19/2022 15:55    Procedures Procedures (including critical care time)  Medications Ordered in UC Medications - No data to display  Initial Impression / Assessment and Plan / UC Course  I have reviewed the triage vital signs and the nursing notes.  Pertinent labs & imaging results that were available during my care of the patient were reviewed by me and considered in my medical decision making (see chart for details).     Patient presents with symptoms likely from a viral upper respiratory infection. Differential includes bacterial pneumonia, sinusitis, allergic  rhinitis, COVID-19, flu.  Symptoms seem unlikely related to ACS, CHF or COPD exacerbations, pneumonia, pneumothorax. Patient is nontoxic appearing and not in need of emergent medical intervention.  Do not think that viral testing is necessary given duration of symptoms.  Chest x-ray was negative for any acute cardiopulmonary process.  Recommended symptom control with over the counter medications.  Patient sent prescriptions.  Return if symptoms fail to improve in 1-2 weeks or you develop shortness of breath, chest pain, severe headache. Patient states understanding and is agreeable.  Discharged with PCP followup.  Final Clinical Impressions(s) / UC Diagnoses   Final diagnoses:  Viral upper respiratory tract infection with cough     Discharge Instructions      Your chest x-ray was normal.  It appears that you may have a viral upper respiratory infection.  This is being treated with medications to alleviate your symptoms as it should run its course and resolve with these medications.  Please follow-up if symptoms persist or worsen.     ED Prescriptions     Medication Sig Dispense Auth. Provider   guaiFENesin 200 MG tablet Take 1 tablet (200 mg total) by mouth every 4 (four) hours as needed for cough or to loosen phlegm. 30 tablet Rawlings, [place] E, Trezevant   fluticasone [facility]) 50 MCG/ACT nasal spray Place 1 spray into both nostrils daily for 3 days. 16 g Teodora Medici, [place]      PDMP not reviewed this encounter.   Teodora Medici, [place] 05/19/22 [phone]

## 2022-05-19 NOTE — Discharge Instructions (Signed)
Your chest x-ray was normal.  It appears that you may have a viral upper respiratory infection.  This is being treated with medications to alleviate your symptoms as it should run its course and resolve with these medications.  Please follow-up if symptoms persist or worsen.

## 2022-05-19 NOTE — ED Triage Notes (Signed)
Pt c/o cough, allergies x 1 week.

## 2022-05-22 ENCOUNTER — Other Ambulatory Visit: Payer: Self-pay

## 2022-05-22 ENCOUNTER — Ambulatory Visit
Admission: RE | Admit: 2022-05-22 | Discharge: 2022-05-22 | Disposition: A | Discharge status: Home / Self Care | Payer: 59 | Source: Ambulatory Visit | Attending: Radiation Oncology | Admitting: Radiation Oncology

## 2022-05-22 DIAGNOSIS — Z510 Encounter for antineoplastic radiation therapy: Secondary | ICD-10-CM | POA: Diagnosis not present

## 2022-05-22 LAB — RAD ONC ARIA SESSION SUMMARY
Course Elapsed Days: 33
Plan Fractions Treated to Date: 23
Plan Prescribed Dose Per Fraction: 1.8 Gy
Plan Total Fractions Prescribed: 25
Plan Total Prescribed Dose: 45 Gy
Reference Point Dosage Given to Date: 41.4 Gy
Reference Point Session Dosage Given: 1.8 Gy
Session Number: 23

## 2022-05-23 ENCOUNTER — Other Ambulatory Visit: Payer: Self-pay

## 2022-05-23 ENCOUNTER — Ambulatory Visit
Admission: RE | Admit: 2022-05-23 | Discharge: 2022-05-23 | Disposition: A | Discharge status: Home / Self Care | Payer: 59 | Source: Ambulatory Visit | Attending: Radiation Oncology | Admitting: Radiation Oncology

## 2022-05-23 DIAGNOSIS — Z510 Encounter for antineoplastic radiation therapy: Secondary | ICD-10-CM | POA: Diagnosis not present

## 2022-05-23 LAB — RAD ONC ARIA SESSION SUMMARY
Course Elapsed Days: 34
Plan Fractions Treated to Date: 24
Plan Prescribed Dose Per Fraction: 1.8 Gy
Plan Total Fractions Prescribed: 25
Plan Total Prescribed Dose: 45 Gy
Reference Point Dosage Given to Date: 43.2 Gy
Reference Point Session Dosage Given: 1.8 Gy
Session Number: 24

## 2022-05-24 ENCOUNTER — Ambulatory Visit
Admission: RE | Admit: 2022-05-24 | Discharge: 2022-05-24 | Disposition: A | Discharge status: Home / Self Care | Payer: 59 | Source: Ambulatory Visit | Attending: Radiation Oncology | Admitting: Radiation Oncology

## 2022-05-24 ENCOUNTER — Other Ambulatory Visit: Payer: Self-pay

## 2022-05-24 DIAGNOSIS — Z510 Encounter for antineoplastic radiation therapy: Secondary | ICD-10-CM | POA: Diagnosis not present

## 2022-05-24 LAB — RAD ONC ARIA SESSION SUMMARY
Course Elapsed Days: 35
Plan Fractions Treated to Date: 25
Plan Prescribed Dose Per Fraction: 1.8 Gy
Plan Total Fractions Prescribed: 25
Plan Total Prescribed Dose: 45 Gy
Reference Point Dosage Given to Date: 45 Gy
Reference Point Session Dosage Given: 1.8 Gy
Session Number: 25

## 2022-05-25 ENCOUNTER — Ambulatory Visit
Admission: RE | Admit: 2022-05-25 | Discharge: 2022-05-25 | Disposition: A | Discharge status: Home / Self Care | Payer: 59 | Source: Ambulatory Visit | Attending: Radiation Oncology | Admitting: Radiation Oncology

## 2022-05-25 ENCOUNTER — Ambulatory Visit
Admission: EM | Admit: 2022-05-25 | Discharge: 2022-05-25 | Disposition: A | Discharge status: Home / Self Care | Payer: 59 | Attending: Internal Medicine | Admitting: Internal Medicine

## 2022-05-25 ENCOUNTER — Other Ambulatory Visit: Payer: Self-pay

## 2022-05-25 DIAGNOSIS — J069 Acute upper respiratory infection, unspecified: Secondary | ICD-10-CM | POA: Diagnosis not present

## 2022-05-25 DIAGNOSIS — R053 Chronic cough: Secondary | ICD-10-CM | POA: Diagnosis not present

## 2022-05-25 DIAGNOSIS — Z510 Encounter for antineoplastic radiation therapy: Secondary | ICD-10-CM | POA: Diagnosis not present

## 2022-05-25 LAB — RAD ONC ARIA SESSION SUMMARY
Course Elapsed Days: 36
Plan Fractions Treated to Date: 1
Plan Prescribed Dose Per Fraction: 1.8 Gy
Plan Total Fractions Prescribed: 13
Plan Total Prescribed Dose: 23.4 Gy
Reference Point Dosage Given to Date: 46.8 Gy
Reference Point Session Dosage Given: 1.8 Gy
Session Number: 26

## 2022-05-25 MED ORDER — AZITHROMYCIN 250 MG PO TABS: ORAL_TABLET | ORAL | 0 refills | Status: AC

## 2022-05-25 MED ORDER — PREDNISONE 20 MG PO TABS: 40.0000 mg | ORAL_TABLET | Freq: Every day | ORAL | 0 refills | Status: AC

## 2022-05-25 NOTE — Discharge Instructions (Signed)
You have been prescribed an antibiotic and prednisone steroid to help alleviate symptoms.  Please follow-up if symptoms persist or worsen.  Follow-up with pulmonology for further evaluation and management as well.

## 2022-05-25 NOTE — ED Provider Notes (Signed)
[facility]    CSN: [id number] Arrival date & time: 05/25/22  1702      History   Chief Complaint Chief Complaint  Patient presents with   Cough    HPI Erik Gill is a 61 y.o. male.   Patient presents with persistent cough and nasal congestion.  Patient was seen on 05/22/2022 for similar symptoms.  He was diagnosed with viral illness and prescribed cough medicine with no improvement.  Denies any known fevers.  Denies chest pain or shortness of breath.  Patient denies formal diagnosis of COPD or asthma but is a smoker.  Patient was seen approximately 1 month ago for suspicion of COPD exacerbation.   Cough   Past Medical History:  Diagnosis Date   Elevated PSA    GERD (gastroesophageal reflux disease)    Headache    sinus related   Hypertension    Nocturia    Prostate cancer [facility])    Wears glasses     Patient Active Problem List   Diagnosis Date Noted   Family history of prostate cancer 03/01/2022   Genetic testing 03/01/2022   Elevated PSA 11/05/2021   Malignant neoplasm of prostate ([place]) 10/08/2017   Sinusitis, chronic 07/15/2016   Smoker 07/15/2016   Essential hypertension 07/15/2016    Past Surgical History:  Procedure Laterality Date   HERNIA REPAIR Right    LYMPHADENECTOMY Bilateral 11/14/2021   Procedure: LYMPHADENECTOMY, PELVIC;  Surgeon: Raynelle Bring, MD;  Location: [facility];  Service: Urology;  Laterality: Bilateral;   PROSTATE BIOPSY N/A 08/29/2017   Procedure: BIOPSY TRANSRECTAL ULTRASONIC PROSTATE (TUBP);  Surgeon: Ceasar Mons, MD;  Location: [facility];  Service: Urology;  Laterality: N/A;   ROBOT ASSISTED LAPAROSCOPIC RADICAL PROSTATECTOMY N/A 11/14/2021   Procedure: XI ROBOTIC ASSISTED LAPAROSCOPIC RADICAL PROSTATECTOMY LEVEL 1;  Surgeon: Raynelle Bring, MD;  Location: [facility];  Service: Urology;  Laterality: N/A;       Home Medications    Prior to Admission medications   Medication Sig Start  Date End Date Taking? Authorizing Provider  azithromycin (ZITHROMAX Z-PAK) 250 MG tablet Take 2 tablets (500 mg total) by mouth daily for 1 day, THEN 1 tablet (250 mg total) daily for 4 days. 05/25/22 05/30/22 Yes Kallum Jorgensen, Michele Rockers, FNP  predniSONE (DELTASONE) 20 MG tablet Take 2 tablets (40 mg total) by mouth daily for 5 days. 05/25/22 05/30/22 Yes Klaire Court, Hildred Alamin E, FNP  amLODipine-benazepril (LOTREL) 10-40 MG capsule Take 1 capsule by mouth daily. 09/28/16   [provider]  ASPIRIN PO Take 81 mg by mouth.    [provider]  benzonatate (TESSALON) 100 MG capsule Take 1 capsule (100 mg total) by mouth every 8 (eight) hours. 04/12/22   Raspet, Derry Skill, PA-C  docusate sodium (COLACE) 100 MG capsule Take 1 capsule (100 mg total) by mouth 2 (two) times daily. Patient not taking: Reported on 02/28/2022 11/14/21   Debbrah Alar, PA-C  fluticasone [facility]) 50 MCG/ACT nasal spray Place 1 spray into both nostrils daily for 3 days. 05/19/22 05/22/22  Teodora Medici, FNP  guaiFENesin 200 MG tablet Take 1 tablet (200 mg total) by mouth every 4 (four) hours as needed for cough or to loosen phlegm. 05/19/22   Teodora Medici, FNP  levocetirizine (XYZAL) 5 MG tablet Take 1 tablet (5 mg total) by mouth every evening. 04/04/22   Eulogio Bear, NP  megestrol (MEGACE) 20 MG tablet Take 1 tablet (20 mg total) by mouth 2 (two) times daily.  04/21/22   Tyler Pita, MD  metoprolol succinate (TOPROL-XL) 25 MG 24 hr tablet Take 25 mg by mouth daily.    [provider]  Naphazoline-Pheniramine (OPCON-A) 0.027-0.315 % SOLN Place 1 drop into both eyes daily as needed (allergies). Patient not taking: Reported on 02/28/2022    [provider]    Family History Family History  Problem Relation Age of Onset   Cancer Father        colon cancer   Cancer Brother        prostate   Cancer Brother        prostate   Cancer Cousin        prostate cancer/maternal first cousin    Social  History Social History   Tobacco Use   Smoking status: Every Day    Packs/day: 0.50    Years: 1.00    Total pack years: 0.50    Types: Cigarettes   Smokeless tobacco: Never   Tobacco comments:    0.5 PP2D  Vaping Use   Vaping Use: Never used  Substance Use Topics   Alcohol use: Not Currently    Comment: OCCASIONAL   Drug use: No     Allergies   Patient has no known allergies.   Review of Systems Review of Systems Per HPI  Physical Exam Triage Vital Signs ED Triage Vitals  Enc Vitals Group     BP 05/25/22 1727 133/83     Pulse Rate 05/25/22 1727 73     Resp --      Temp 05/25/22 1727 98.5 F (36.9 C)     Temp Source 05/25/22 1727 Oral     SpO2 05/25/22 1727 95 %     Weight --      Height --      Head Circumference --      Peak Flow --      Pain Score 05/25/22 1733 0     Pain Loc --      Pain Edu? --      Excl. in [place]? --    No data found.  Updated Vital Signs BP 133/83 (BP Location: Left Arm)   Pulse 73   Temp 98.5 F (36.9 C) (Oral)   SpO2 95%   Visual Acuity Right Eye Distance:   Left Eye Distance:   Bilateral Distance:    Right Eye Near:   Left Eye Near:    Bilateral Near:     Physical Exam Constitutional:      General: He is not in acute distress.    Appearance: Normal appearance. He is not toxic-appearing or diaphoretic.  HENT:     Head: Normocephalic and atraumatic.     Right Ear: Tympanic membrane and ear canal normal.     Left Ear: Tympanic membrane and ear canal normal.     Nose: Congestion present.     Mouth/Throat:     Mouth: Mucous membranes are moist.     Pharynx: No posterior oropharyngeal erythema.  Eyes:     Extraocular Movements: Extraocular movements intact.     Conjunctiva/sclera: Conjunctivae normal.     Pupils: Pupils are equal, round, and reactive to light.  Cardiovascular:     Rate and Rhythm: Normal rate and regular rhythm.     Pulses: Normal pulses.     Heart sounds: Normal heart sounds.  Pulmonary:      Effort: Pulmonary effort is normal. No respiratory distress.     Breath sounds: Normal breath sounds. No stridor.  No wheezing, rhonchi or rales.  Abdominal:     General: Abdomen is flat. Bowel sounds are normal.     Palpations: Abdomen is soft.  Musculoskeletal:        General: Normal range of motion.     Cervical back: Normal range of motion.  Skin:    General: Skin is warm and dry.  Neurological:     General: No focal deficit present.     Mental Status: He is alert and oriented to person, place, and time. Mental status is at baseline.  Psychiatric:        Mood and Affect: Mood normal.        Behavior: Behavior normal.      UC Treatments / Results  Labs (all labs ordered are listed, but only abnormal results are displayed) Labs Reviewed - No data to display  EKG   Radiology No results found.  Procedures Procedures (including critical care time)  Medications Ordered in UC Medications - No data to display  Initial Impression / Assessment and Plan / UC Course  I have reviewed the triage vital signs and the nursing notes.  Pertinent labs & imaging results that were available during my care of the patient were reviewed by me and considered in my medical decision making (see chart for details).     There are no adventitious lung sounds on exam so do not think that further imaging is necessary.  Suspect possible COPD exacerbation given that patient is a smoker.  Will treat with azithromycin given most recent evidence to support this.  Prednisone steroid also prescribed patient to take.  Patient has taken prednisone before and tolerated well.  Patient was also advised that it may be beneficial to see pulmonologist given recent upper respiratory infections and concern for COPD.  Patient was provided with contact information for pulmonology and advised to follow-up.  Patient was given strict return and ER precautions.  Patient verbalized understanding and was agreeable with  plan. Final Clinical Impressions(s) / UC Diagnoses   Final diagnoses:  Persistent cough  Acute upper respiratory infection     Discharge Instructions      You have been prescribed an antibiotic and prednisone steroid to help alleviate symptoms.  Please follow-up if symptoms persist or worsen.  Follow-up with pulmonology for further evaluation and management as well.    ED Prescriptions     Medication Sig Dispense Auth. Provider   azithromycin (ZITHROMAX Z-PAK) 250 MG tablet Take 2 tablets (500 mg total) by mouth daily for 1 day, THEN 1 tablet (250 mg total) daily for 4 days. 6 tablet [place], [place] E, [place]   predniSONE (DELTASONE) 20 MG tablet Take 2 tablets (40 mg total) by mouth daily for 5 days. 10 tablet Teodora Medici, [place]      PDMP not reviewed this encounter.   Teodora Medici, [place] 05/25/22 [phone]

## 2022-05-25 NOTE — ED Triage Notes (Signed)
Pt c/o cough and sneezing not relieved with tx from last visit

## 2022-05-26 ENCOUNTER — Other Ambulatory Visit: Payer: Self-pay

## 2022-05-26 ENCOUNTER — Ambulatory Visit
Admission: RE | Admit: 2022-05-26 | Discharge: 2022-05-26 | Disposition: A | Discharge status: Home / Self Care | Payer: 59 | Source: Ambulatory Visit | Attending: Radiation Oncology | Admitting: Radiation Oncology

## 2022-05-26 DIAGNOSIS — Z510 Encounter for antineoplastic radiation therapy: Secondary | ICD-10-CM | POA: Diagnosis not present

## 2022-05-26 LAB — RAD ONC ARIA SESSION SUMMARY
Course Elapsed Days: 37
Plan Fractions Treated to Date: 2
Plan Prescribed Dose Per Fraction: 1.8 Gy
Plan Total Fractions Prescribed: 13
Plan Total Prescribed Dose: 23.4 Gy
Reference Point Dosage Given to Date: 48.6 Gy
Reference Point Session Dosage Given: 1.8 Gy
Session Number: 27

## 2022-05-29 ENCOUNTER — Other Ambulatory Visit: Payer: Self-pay

## 2022-05-29 ENCOUNTER — Ambulatory Visit
Admission: RE | Admit: 2022-05-29 | Discharge: 2022-05-29 | Disposition: A | Discharge status: Home / Self Care | Payer: 59 | Source: Ambulatory Visit | Attending: Radiation Oncology | Admitting: Radiation Oncology

## 2022-05-29 DIAGNOSIS — Z510 Encounter for antineoplastic radiation therapy: Secondary | ICD-10-CM | POA: Diagnosis not present

## 2022-05-29 LAB — RAD ONC ARIA SESSION SUMMARY
Course Elapsed Days: 40
Plan Fractions Treated to Date: 3
Plan Prescribed Dose Per Fraction: 1.8 Gy
Plan Total Fractions Prescribed: 13
Plan Total Prescribed Dose: 23.4 Gy
Reference Point Dosage Given to Date: 50.4 Gy
Reference Point Session Dosage Given: 1.8 Gy
Session Number: 28

## 2022-05-30 ENCOUNTER — Ambulatory Visit
Admission: RE | Admit: 2022-05-30 | Discharge: 2022-05-30 | Disposition: A | Discharge status: Home / Self Care | Payer: 59 | Source: Ambulatory Visit | Attending: Radiation Oncology | Admitting: Radiation Oncology

## 2022-05-30 ENCOUNTER — Other Ambulatory Visit: Payer: Self-pay

## 2022-05-30 DIAGNOSIS — Z510 Encounter for antineoplastic radiation therapy: Secondary | ICD-10-CM | POA: Diagnosis not present

## 2022-05-30 LAB — RAD ONC ARIA SESSION SUMMARY
Course Elapsed Days: 41
Plan Fractions Treated to Date: 4
Plan Prescribed Dose Per Fraction: 1.8 Gy
Plan Total Fractions Prescribed: 13
Plan Total Prescribed Dose: 23.4 Gy
Reference Point Dosage Given to Date: 52.2 Gy
Reference Point Session Dosage Given: 1.8 Gy
Session Number: 29

## 2022-05-31 ENCOUNTER — Ambulatory Visit
Admission: RE | Admit: 2022-05-31 | Discharge: 2022-05-31 | Disposition: A | Discharge status: Home / Self Care | Payer: 59 | Source: Ambulatory Visit | Attending: Radiation Oncology | Admitting: Radiation Oncology

## 2022-05-31 ENCOUNTER — Other Ambulatory Visit: Payer: Self-pay

## 2022-05-31 DIAGNOSIS — Z510 Encounter for antineoplastic radiation therapy: Secondary | ICD-10-CM | POA: Diagnosis not present

## 2022-05-31 LAB — RAD ONC ARIA SESSION SUMMARY
Course Elapsed Days: 42
Plan Fractions Treated to Date: 5
Plan Prescribed Dose Per Fraction: 1.8 Gy
Plan Total Fractions Prescribed: 13
Plan Total Prescribed Dose: 23.4 Gy
Reference Point Dosage Given to Date: 54 Gy
Reference Point Session Dosage Given: 1.8 Gy
Session Number: 30

## 2022-06-01 ENCOUNTER — Other Ambulatory Visit: Payer: Self-pay

## 2022-06-01 ENCOUNTER — Ambulatory Visit
Admission: RE | Admit: 2022-06-01 | Discharge: 2022-06-01 | Disposition: A | Discharge status: Home / Self Care | Payer: 59 | Source: Ambulatory Visit | Attending: Radiation Oncology | Admitting: Radiation Oncology

## 2022-06-01 DIAGNOSIS — Z510 Encounter for antineoplastic radiation therapy: Secondary | ICD-10-CM | POA: Diagnosis not present

## 2022-06-01 LAB — RAD ONC ARIA SESSION SUMMARY
Course Elapsed Days: 43
Plan Fractions Treated to Date: 6
Plan Prescribed Dose Per Fraction: 1.8 Gy
Plan Total Fractions Prescribed: 13
Plan Total Prescribed Dose: 23.4 Gy
Reference Point Dosage Given to Date: 55.8 Gy
Reference Point Session Dosage Given: 1.8 Gy
Session Number: 31

## 2022-06-02 ENCOUNTER — Other Ambulatory Visit: Payer: Self-pay

## 2022-06-02 ENCOUNTER — Ambulatory Visit
Admission: RE | Admit: 2022-06-02 | Discharge: 2022-06-02 | Disposition: A | Discharge status: Home / Self Care | Payer: 59 | Source: Ambulatory Visit | Attending: Radiation Oncology | Admitting: Radiation Oncology

## 2022-06-02 DIAGNOSIS — Z510 Encounter for antineoplastic radiation therapy: Secondary | ICD-10-CM | POA: Diagnosis not present

## 2022-06-02 LAB — RAD ONC ARIA SESSION SUMMARY
Course Elapsed Days: 44
Plan Fractions Treated to Date: 7
Plan Prescribed Dose Per Fraction: 1.8 Gy
Plan Total Fractions Prescribed: 13
Plan Total Prescribed Dose: 23.4 Gy
Reference Point Dosage Given to Date: 57.6 Gy
Reference Point Session Dosage Given: 1.8 Gy
Session Number: 32

## 2022-06-05 ENCOUNTER — Other Ambulatory Visit: Payer: Self-pay

## 2022-06-05 ENCOUNTER — Ambulatory Visit
Admission: RE | Admit: 2022-06-05 | Discharge: 2022-06-05 | Disposition: A | Discharge status: Home / Self Care | Payer: 59 | Source: Ambulatory Visit | Attending: Radiation Oncology | Admitting: Radiation Oncology

## 2022-06-05 DIAGNOSIS — C61 Malignant neoplasm of prostate: Secondary | ICD-10-CM | POA: Diagnosis present

## 2022-06-05 DIAGNOSIS — Z510 Encounter for antineoplastic radiation therapy: Secondary | ICD-10-CM | POA: Diagnosis not present

## 2022-06-05 LAB — RAD ONC ARIA SESSION SUMMARY
Course Elapsed Days: 47
Plan Fractions Treated to Date: 8
Plan Prescribed Dose Per Fraction: 1.8 Gy
Plan Total Fractions Prescribed: 13
Plan Total Prescribed Dose: 23.4 Gy
Reference Point Dosage Given to Date: 59.4 Gy
Reference Point Session Dosage Given: 1.8 Gy
Session Number: 33

## 2022-06-07 ENCOUNTER — Other Ambulatory Visit: Payer: Self-pay

## 2022-06-07 ENCOUNTER — Ambulatory Visit
Admission: RE | Admit: 2022-06-07 | Discharge: 2022-06-07 | Disposition: A | Discharge status: Home / Self Care | Payer: 59 | Source: Ambulatory Visit | Attending: Radiation Oncology | Admitting: Radiation Oncology

## 2022-06-07 DIAGNOSIS — Z510 Encounter for antineoplastic radiation therapy: Secondary | ICD-10-CM | POA: Diagnosis not present

## 2022-06-07 LAB — RAD ONC ARIA SESSION SUMMARY
Course Elapsed Days: 49
Plan Fractions Treated to Date: 9
Plan Prescribed Dose Per Fraction: 1.8 Gy
Plan Total Fractions Prescribed: 13
Plan Total Prescribed Dose: 23.4 Gy
Reference Point Dosage Given to Date: 61.2 Gy
Reference Point Session Dosage Given: 1.8 Gy
Session Number: 34

## 2022-06-08 ENCOUNTER — Ambulatory Visit
Admission: RE | Admit: 2022-06-08 | Discharge: 2022-06-08 | Disposition: A | Discharge status: Home / Self Care | Payer: 59 | Source: Ambulatory Visit | Attending: Radiation Oncology | Admitting: Radiation Oncology

## 2022-06-08 ENCOUNTER — Other Ambulatory Visit: Payer: Self-pay

## 2022-06-08 DIAGNOSIS — Z510 Encounter for antineoplastic radiation therapy: Secondary | ICD-10-CM | POA: Diagnosis not present

## 2022-06-08 LAB — RAD ONC ARIA SESSION SUMMARY
Course Elapsed Days: 50
Plan Fractions Treated to Date: 10
Plan Prescribed Dose Per Fraction: 1.8 Gy
Plan Total Fractions Prescribed: 13
Plan Total Prescribed Dose: 23.4 Gy
Reference Point Dosage Given to Date: 63 Gy
Reference Point Session Dosage Given: 1.8 Gy
Session Number: 35

## 2022-06-09 ENCOUNTER — Other Ambulatory Visit: Payer: Self-pay

## 2022-06-09 ENCOUNTER — Ambulatory Visit
Admission: RE | Admit: 2022-06-09 | Discharge: 2022-06-09 | Disposition: A | Discharge status: Home / Self Care | Payer: 59 | Source: Ambulatory Visit | Attending: Radiation Oncology | Admitting: Radiation Oncology

## 2022-06-09 DIAGNOSIS — Z510 Encounter for antineoplastic radiation therapy: Secondary | ICD-10-CM | POA: Diagnosis not present

## 2022-06-09 LAB — RAD ONC ARIA SESSION SUMMARY
Course Elapsed Days: 51
Plan Fractions Treated to Date: 11
Plan Prescribed Dose Per Fraction: 1.8 Gy
Plan Total Fractions Prescribed: 13
Plan Total Prescribed Dose: 23.4 Gy
Reference Point Dosage Given to Date: 64.8 Gy
Reference Point Session Dosage Given: 1.8 Gy
Session Number: 36

## 2022-06-12 ENCOUNTER — Other Ambulatory Visit: Payer: Self-pay

## 2022-06-12 ENCOUNTER — Ambulatory Visit
Admission: RE | Admit: 2022-06-12 | Discharge: 2022-06-12 | Disposition: A | Discharge status: Home / Self Care | Payer: 59 | Source: Ambulatory Visit | Attending: Radiation Oncology | Admitting: Radiation Oncology

## 2022-06-12 DIAGNOSIS — Z510 Encounter for antineoplastic radiation therapy: Secondary | ICD-10-CM | POA: Diagnosis not present

## 2022-06-12 LAB — RAD ONC ARIA SESSION SUMMARY
Course Elapsed Days: 54
Plan Fractions Treated to Date: 12
Plan Prescribed Dose Per Fraction: 1.8 Gy
Plan Total Fractions Prescribed: 13
Plan Total Prescribed Dose: 23.4 Gy
Reference Point Dosage Given to Date: 66.6 Gy
Reference Point Session Dosage Given: 1.8 Gy
Session Number: 37

## 2022-06-13 ENCOUNTER — Ambulatory Visit
Admission: RE | Admit: 2022-06-13 | Discharge: 2022-06-13 | Disposition: A | Discharge status: Home / Self Care | Payer: 59 | Source: Ambulatory Visit | Attending: Radiation Oncology | Admitting: Radiation Oncology

## 2022-06-13 ENCOUNTER — Encounter: Payer: Self-pay | Admitting: Radiation Oncology

## 2022-06-13 ENCOUNTER — Other Ambulatory Visit: Payer: Self-pay

## 2022-06-13 DIAGNOSIS — Z510 Encounter for antineoplastic radiation therapy: Secondary | ICD-10-CM | POA: Diagnosis not present

## 2022-06-13 LAB — RAD ONC ARIA SESSION SUMMARY
Course Elapsed Days: 55
Plan Fractions Treated to Date: 13
Plan Prescribed Dose Per Fraction: 1.8 Gy
Plan Total Fractions Prescribed: 13
Plan Total Prescribed Dose: 23.4 Gy
Reference Point Dosage Given to Date: 68.4 Gy
Reference Point Session Dosage Given: 1.8 Gy
Session Number: 38

## 2022-07-28 NOTE — Progress Notes (Signed)
  Radiation Oncology         [phone]) [phone] ________________________________  Name: Erik Gill MRN: [id number]  Date: 06/13/2022  DOB: 1961/08/26  End of Treatment Note  Diagnosis:   61 y.o. gentleman with adverse pathology and detectable, postoperative PSA at 0.22, s/p RALP 11/2021 for stage pT3bN1, locally advanced, Gleason 3+5 adenocarcinoma of the prostate.     Indication for treatment:  Curative, Definitive Radiotherapy       Radiation treatment dates:   5/17-7/11/23  Site/dose:  1. The prostate fossa and pelvic lymph nodes were initially treated to 45 Gy in 25 fractions of 1.8 Gy  2. The prostate fossa only was boosted to 68.4 Gy with 13 additional fractions of 1.8 Gy   Beams/energy:  1. The prostate fossa  and pelvic lymph nodes were initially treated using VMAT intensity modulated radiotherapy delivering 6 megavolt photons. Image guidance was performed with CB-CT studies prior to each fraction. He was immobilized with a body fix lower extremity mold.  2. The prostate fossa only was boosted using VMAT intensity modulated radiotherapy delivering 6 megavolt photons. Image guidance was performed with CB-CT studies prior to each fraction. He was immobilized with a body fix lower extremity mold.  Narrative: The patient tolerated radiation treatment relatively well.   The patient experienced some minor urinary irritation and modest fatigue.    Plan: The patient has completed radiation treatment. He will return to radiation oncology clinic for routine followup in one month. I advised him to call or return sooner if he has any questions or concerns related to his recovery or treatment. ________________________________  Sheral Apley. Tammi Klippel, M.D.

## 2022-08-01 ENCOUNTER — Telehealth: Payer: Self-pay

## 2022-08-01 ENCOUNTER — Encounter: Payer: Self-pay | Admitting: Urology

## 2022-08-01 NOTE — Progress Notes (Signed)
Telephone appointment. I verified patient's identity and began nursing interview. Patient reports nocturia x3. No other issues reported at this time.  Meaningful use complete. I-PSS score of 3-mild. No urinary management medications. Urology appt- Nov, 2023  Reminded patient of his 9:00am-08/02/22 telephone appointment w/ Ashlyn Bruning PA-C. I left my extension [phone] in case patient needs anything. Patient verbalized understanding.  Patient contact [phone]

## 2022-08-01 NOTE — Telephone Encounter (Signed)
Voicemail reminder for patient's 9:00am-08/02/22 telephone appointment w/ Ashlyn Bruning PA-C. I left my extension [phone] and requested that patient return my call for completion of the nursing portion.

## 2022-08-02 ENCOUNTER — Ambulatory Visit
Admission: RE | Admit: 2022-08-02 | Discharge: 2022-08-02 | Disposition: A | Discharge status: Home / Self Care | Payer: 59 | Source: Ambulatory Visit | Attending: Urology | Admitting: Urology

## 2022-08-02 DIAGNOSIS — C61 Malignant neoplasm of prostate: Secondary | ICD-10-CM

## 2022-08-02 NOTE — Progress Notes (Signed)
Radiation Oncology         [phone]) [phone] ________________________________  Name: Erik Gill MRN: [id number]  Date: 08/02/2022  DOB: [birth year]  Post Treatment Note  CC: Shirline Frees, MD  Raynelle Bring, MD  Diagnosis:   61 y.o. gentleman with adverse pathology and detectable, postoperative PSA at 0.22, s/p RALP 11/2021 for stage pT3bN1, locally advanced, Gleason 3+5 adenocarcinoma of the prostate.  Interval Since Last Radiation:  7 weeks  5/17-7/11/23: 1. The prostate fossa and pelvic lymph nodes were initially treated to 45 Gy in 25 fractions of 1.8 Gy  2. The prostate fossa only was boosted to 68.4 Gy with 13 additional fractions of 1.8 Gy   Narrative:  I spoke with the patient to conduct his routine scheduled 1 month follow up visit via telephone to spare the patient unnecessary potential exposure in the healthcare setting during the current COVID-19 pandemic.  The patient was notified in advance and gave permission to proceed with this visit format.  He tolerated radiation treatment relatively well with only minor urinary irritation and modest fatigue.   He did report increased nocturia 3-4 times per night on occasion and occasional loose stools alternating with constipation.                             On review of systems, the patient states that he is doing well in general and is currently without complaints.  His LUTS have completely resolved at this point and he is back to his baseline.  He reports a healthy appetite and is maintaining his weight.  He denies abdominal pain, nausea, vomiting, diarrhea or constipation.  His energy level is gradually improving as well and overall, he is quite pleased with his progress to date.  ALLERGIES:  has No Known Allergies.  Meds: Current Outpatient Medications  Medication Sig Dispense Refill   amLODipine-benazepril (LOTREL) 10-40 MG capsule Take 1 capsule by mouth daily.     ASPIRIN PO Take 81 mg by mouth.     benzonatate (TESSALON)  100 MG capsule Take 1 capsule (100 mg total) by mouth every 8 (eight) hours. (Patient not taking: Reported on 08/01/2022) 21 capsule 0   docusate sodium (COLACE) 100 MG capsule Take 1 capsule (100 mg total) by mouth 2 (two) times daily. (Patient not taking: Reported on 02/28/2022)     fluticasone (FLONASE) 50 MCG/ACT nasal spray Place 1 spray into both nostrils daily for 3 days. 16 g 0   guaiFENesin 200 MG tablet Take 1 tablet (200 mg total) by mouth every 4 (four) hours as needed for cough or to loosen phlegm. 30 tablet 0   levocetirizine (XYZAL) 5 MG tablet Take 1 tablet (5 mg total) by mouth every evening. 30 tablet 0   megestrol (MEGACE) 20 MG tablet Take 1 tablet (20 mg total) by mouth 2 (two) times daily. 60 tablet 5   metoprolol succinate (TOPROL-XL) 25 MG 24 hr tablet Take 25 mg by mouth daily.     Naphazoline-Pheniramine (OPCON-A) 0.027-0.315 % SOLN Place 1 drop into both eyes daily as needed (allergies). (Patient not taking: Reported on 02/28/2022)     No current facility-administered medications for this encounter.    Physical Findings:  vitals were not taken for this visit.  Pain Assessment Pain Score: 0-No pain/10 Unable to assess due to telephone follow-up visit format.  Lab Findings: Lab Results  Component Value Date   WBC 8.9 11/03/2021   HGB 11.9 (L)  11/15/2021   HCT 36.3 (L) 11/15/2021   MCV 83.2 11/03/2021   PLT 291 11/03/2021     Radiographic Findings: No results found.  Impression/Plan: 1. 61 y.o. gentleman with adverse pathology and detectable, postoperative PSA at 0.22, s/p RALP 11/2021 for stage pT3bN1, locally advanced, Gleason 3+5 adenocarcinoma of the prostate. He will continue to follow up with urology for ongoing PSA determinations and has an appointment scheduled with Dr. Alinda Money in November 2023. He understands what to expect with regards to PSA monitoring going forward. I will look forward to following his response to treatment via correspondence with  urology, and would be happy to continue to participate in his care if clinically indicated. I talked to the patient about what to expect in the future, including his risk for erectile dysfunction and rectal bleeding. I encouraged him to call or return to the office if he has any questions regarding his previous radiation or possible radiation side effects. He was comfortable with this plan and will follow up as needed.     Nicholos Johns, PA-C

## 2022-09-04 ENCOUNTER — Encounter: Payer: Self-pay | Admitting: *Deleted

## 2022-09-07 ENCOUNTER — Encounter: Payer: Self-pay | Admitting: *Deleted

## 2022-09-07 NOTE — Progress Notes (Signed)
Survivorship Treatment Summary completed for upcoming appt.

## 2022-09-14 ENCOUNTER — Encounter: Payer: Self-pay | Admitting: *Deleted

## 2022-09-14 ENCOUNTER — Other Ambulatory Visit: Payer: Self-pay | Admitting: *Deleted

## 2022-09-14 ENCOUNTER — Telehealth: Payer: Self-pay | Admitting: *Deleted

## 2022-09-14 ENCOUNTER — Inpatient Hospital Stay: Payer: 59 | Attending: Adult Health | Admitting: *Deleted

## 2022-09-14 DIAGNOSIS — C61 Malignant neoplasm of prostate: Secondary | ICD-10-CM

## 2022-09-14 NOTE — Addendum Note (Signed)
Addended by: [employer] D on: 09/14/2022 12:37 PM   Modules accepted: Level of Service

## 2022-09-14 NOTE — Progress Notes (Addendum)
SCP reviewed and completed.  2 Identifiers used for this visit for verification purposes only. No vitals taken today as this was a telephone visit. Pt denies pain today and says he has very little fatigue. He rates it a 3/10. Pt says, urinary frequency is much better as well as getting up to urinate at bedtime. He is working full-time and doing well. He will see PCP in 6 mos. We talked about colon cancer screening. He has never had a colonoscopy. He does have a family history of colon cancer, first generation.Pt does still smokes, he said he smokes a 1/2 to 1 pack of cigarettes every 2 days. Order placed today for a colonoscopy and low dose CT of the lungs. Pt says his urinary status is doing much better. He says, he doesn't have much urgency or frequency. Pt has a very active job and walks quite a bit, but does no routine of exercise. The  ''[employer]" was discussed. Pt feels like he is doing well overall.

## 2022-09-15 ENCOUNTER — Other Ambulatory Visit: Payer: Self-pay | Admitting: Adult Health

## 2022-09-15 NOTE — Addendum Note (Signed)
Addended by: [employer] D on: 09/15/2022 09:17 AM   Modules accepted: Orders

## 2023-08-22 ENCOUNTER — Ambulatory Visit
Admission: EM | Admit: 2023-08-22 | Discharge: 2023-08-22 | Disposition: A | Discharge status: Home / Self Care | Payer: 59

## 2023-08-22 DIAGNOSIS — B353 Tinea pedis: Secondary | ICD-10-CM

## 2023-08-22 MED ORDER — CLOTRIMAZOLE-BETAMETHASONE 1-0.05 % EX CREA: TOPICAL_CREAM | CUTANEOUS | 0 refills | Status: DC

## 2023-08-22 NOTE — ED Triage Notes (Signed)
"  I think I have a foot fungus on my right foot real bad but also maybe left". "Very itchy, redness, redness seems to be moving up right ankle".

## 2023-08-22 NOTE — ED Provider Notes (Signed)
[name]    CSN: [id number] Arrival date & time: 08/22/23  2440      History   Chief Complaint Chief Complaint  Patient presents with   Feet Problem    HPI Erik Gill is a 62 y.o. male.   Patient here today for evaluation of suspected fungal infection to bilateral feet.  He states that right foot is seemingly more affected than left.  He has not had any symptoms other than significant itching.  He first noticed symptoms yesterday.  The history is provided by the patient.    Past Medical History:  Diagnosis Date   Elevated PSA    GERD (gastroesophageal reflux disease)    Headache    sinus related   Hypertension    Nocturia    Prostate cancer [facility])    Wears glasses     Patient Active Problem List   Diagnosis Date Noted   Family history of prostate cancer 03/01/2022   Genetic testing 03/01/2022   Elevated PSA 11/05/2021   Malignant neoplasm of prostate (HCC) 10/08/2017   Sinusitis, chronic 07/15/2016   Smoker 07/15/2016   Essential hypertension 07/15/2016    Past Surgical History:  Procedure Laterality Date   HERNIA REPAIR Right    LYMPHADENECTOMY Bilateral 11/14/2021   Procedure: LYMPHADENECTOMY, PELVIC;  Surgeon: Heloise Purpura, MD;  Location: [facility];  Service: Urology;  Laterality: Bilateral;   PROSTATE BIOPSY N/A 08/29/2017   Procedure: BIOPSY TRANSRECTAL ULTRASONIC PROSTATE (TUBP);  Surgeon: Rene Paci, MD;  Location: [facility];  Service: Urology;  Laterality: N/A;   ROBOT ASSISTED LAPAROSCOPIC RADICAL PROSTATECTOMY N/A 11/14/2021   Procedure: XI ROBOTIC ASSISTED LAPAROSCOPIC RADICAL PROSTATECTOMY LEVEL 1;  Surgeon: Heloise Purpura, MD;  Location: [facility];  Service: Urology;  Laterality: N/A;       Home Medications    Prior to Admission medications   Medication Sig Start Date End Date Taking? Authorizing Provider  clotrimazole-betamethasone (LOTRISONE) cream Apply to affected area 2 times daily prn  08/22/23  Yes Tomi Bamberger, PA-C  tadalafil (CIALIS) 20 MG tablet Take 20 mg by mouth daily as needed for erectile dysfunction. 09/27/15  Yes [provider]  amLODipine-benazepril (LOTREL) 10-40 MG capsule Take 1 capsule by mouth daily. 09/28/16   [provider]  ASPIRIN PO Take 81 mg by mouth.    [provider]  benzonatate (TESSALON) 100 MG capsule Take 1 capsule (100 mg total) by mouth every 8 (eight) hours. Patient not taking: Reported on 08/01/2022 04/12/22   Raspet, Noberto Retort, PA-C  cetirizine (ZYRTEC ALLERGY) 10 MG tablet Take 10 mg by mouth daily.    [provider]  cetirizine (ZYRTEC) 10 MG tablet Take 10 mg by mouth daily.    [provider]  Dextromethorphan-guaiFENesin (MUCINEX DM MAXIMUM STRENGTH PO) Take 1,200 mg by mouth 2 (two) times daily.    [provider]  docusate sodium (COLACE) 100 MG capsule Take 1 capsule (100 mg total) by mouth 2 (two) times daily. Patient not taking: Reported on 02/28/2022 11/14/21   Harrie Foreman, PA-C  fluticasone [facility]) 50 MCG/ACT nasal spray Place 1 spray into both nostrils daily for 3 days. 05/19/22 09/14/22  Gustavus Bryant, FNP  fluticasone (FLONASE) 50 MCG/ACT nasal spray Place 2 sprays into both nostrils daily.    [provider]  guaiFENesin 200 MG tablet Take 1 tablet (200 mg total) by mouth every 4 (four) hours as needed for cough or to loosen phlegm. Patient not taking: Reported  on 09/14/2022 05/19/22   Gustavus Bryant, FNP  meloxicam (MOBIC) 15 MG tablet Take 15 mg by mouth daily.    [provider]  metoprolol succinate (TOPROL-XL) 100 MG 24 hr tablet Take 100 mg by mouth daily. 08/21/22   [provider]  Naphazoline-Pheniramine (OPCON-A) 0.027-0.315 % SOLN Place 1 drop into both eyes daily as needed (allergies).    [provider]  Naphazoline-Pheniramine (OPCON-A) 0.027-0.315 % SOLN 1 drop into affected eye  as needed Ophthalmic Four times a day     [provider]  triamcinolone cream (KENALOG) 0.1 % Apply 1 Application topically 2 (two) times daily.    [provider]  UNKNOWN TO PATIENT "Suppliment, PRN".    [provider]    Family History Family History  Problem Relation Age of Onset   Cancer Father        colon cancer   Cancer Brother        prostate   Cancer Brother        prostate   Cancer Cousin        prostate cancer/maternal first cousin    Social History Social History   Tobacco Use   Smoking status: Every Day    Current packs/day: 0.50    Average packs/day: 0.5 packs/day for 1 year (0.5 ttl pk-yrs)    Types: Cigarettes   Smokeless tobacco: Never   Tobacco comments:    0.5 PP2D  Vaping Use   Vaping status: Never Used  Substance Use Topics   Alcohol use: Not Currently    Comment: OCCASIONAL   Drug use: No     Allergies   Patient has no active allergies.   Review of Systems Review of Systems  Constitutional:  Negative for chills and fever.  Eyes:  Negative for discharge and redness.  Musculoskeletal:  Negative for arthralgias.  Skin:  Positive for rash. Negative for color change.  Neurological:  Negative for numbness.     Physical Exam Triage Vital Signs ED Triage Vitals  Encounter Vitals Group     BP 08/22/23 0936 (!) 173/83     Systolic BP Percentile --      Diastolic BP Percentile --      Pulse Rate 08/22/23 0936 81     Resp 08/22/23 0936 18     Temp 08/22/23 0936 98.8 F (37.1 C)     Temp Source 08/22/23 0936 Oral     SpO2 08/22/23 0936 99 %     Weight 08/22/23 0933 200 lb (90.7 kg)     Height 08/22/23 0933 5\' 8"  (1.727 m)     Head Circumference --      Peak Flow --      Pain Score 08/22/23 0931 0     Pain Loc --      Pain Education --      Exclude from Growth Chart --    No data found.  Updated Vital Signs BP (!) 170/82 (BP Location: Left Arm)   Pulse 88   Temp 98.8 F (37.1 C) (Oral)   Resp 18   Ht 5\' 8"  (1.727 m)   Wt 200 lb (90.7 kg)    SpO2 99%   BMI 30.41 kg/m     Physical Exam Vitals and nursing note reviewed.  Constitutional:      General: He is not in acute distress.    Appearance: Normal appearance. He is not ill-appearing.  HENT:     Head: Normocephalic and atraumatic.  Eyes:  Conjunctiva/sclera: Conjunctivae normal.  Cardiovascular:     Rate and Rhythm: Normal rate.  Pulmonary:     Effort: Pulmonary effort is normal. No respiratory distress.  Skin:    Comments: Erythematous macular appearing rash to dorsal foot with mild spreading to distal right medial malleolus.  Neurological:     Mental Status: He is alert.  Psychiatric:        Mood and Affect: Mood normal.        Behavior: Behavior normal.        Thought Content: Thought content normal.      UC Treatments / Results  Labs (all labs ordered are listed, but only abnormal results are displayed) Labs Reviewed - No data to display  EKG   Radiology No results found.  Procedures Procedures (including critical care time)  Medications Ordered in UC Medications - No data to display  Initial Impression / Assessment and Plan / UC Course  I have reviewed the triage vital signs and the nursing notes.  Pertinent labs & imaging results that were available during my care of the patient were reviewed by me and considered in my medical decision making (see chart for details).    Lotrisone prescribed for topical treatment of suspected tinea pedis.  Recommended follow-up if no gradual improvement with any worsening symptoms.  Final Clinical Impressions(s) / UC Diagnoses   Final diagnoses:  Tinea pedis of both feet   Discharge Instructions   None    ED Prescriptions     Medication Sig Dispense Auth. Provider   clotrimazole-betamethasone (LOTRISONE) cream Apply to affected area 2 times daily prn 45 g Tomi Bamberger, PA-C      PDMP not reviewed this encounter.   Tomi Bamberger, PA-C 08/22/23 1027

## 2023-10-13 IMAGING — PT NM PET TUM IMG SKULL BASE T - THIGH
1 series · 2 of 2 positions shown · non-contrast
Comparison: None.

CLINICAL DATA: Prostate carcinoma with biochemical recurrence.

EXAM:
NUCLEAR MEDICINE PET SKULL BASE TO THIGH
TECHNIQUE: 9.53 mCi F18 Piflufolastat (Pylarify) was injected intravenously.
Full-ring PET imaging was performed from the skull base to thigh
after the radiotracer. CT data was obtained and used for attenuation
correction and anatomic localization.

[Series 1058: results mm oncology reading · 1.0mm · 0.89mm/px · 2 of 2 slices shown]
[im 1/2]
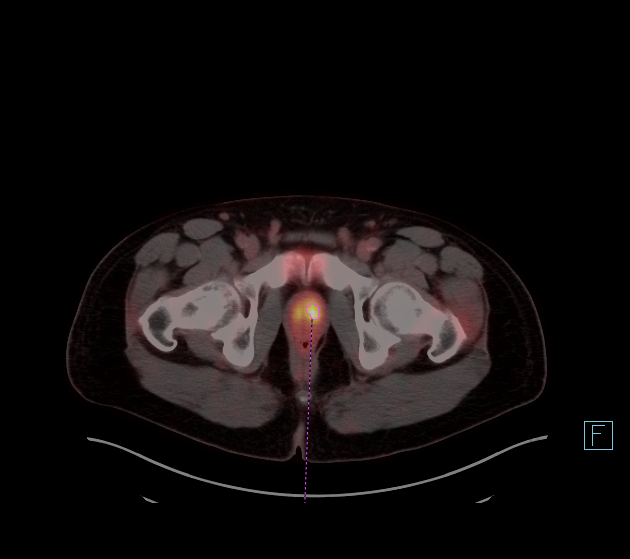
[im 2/2]
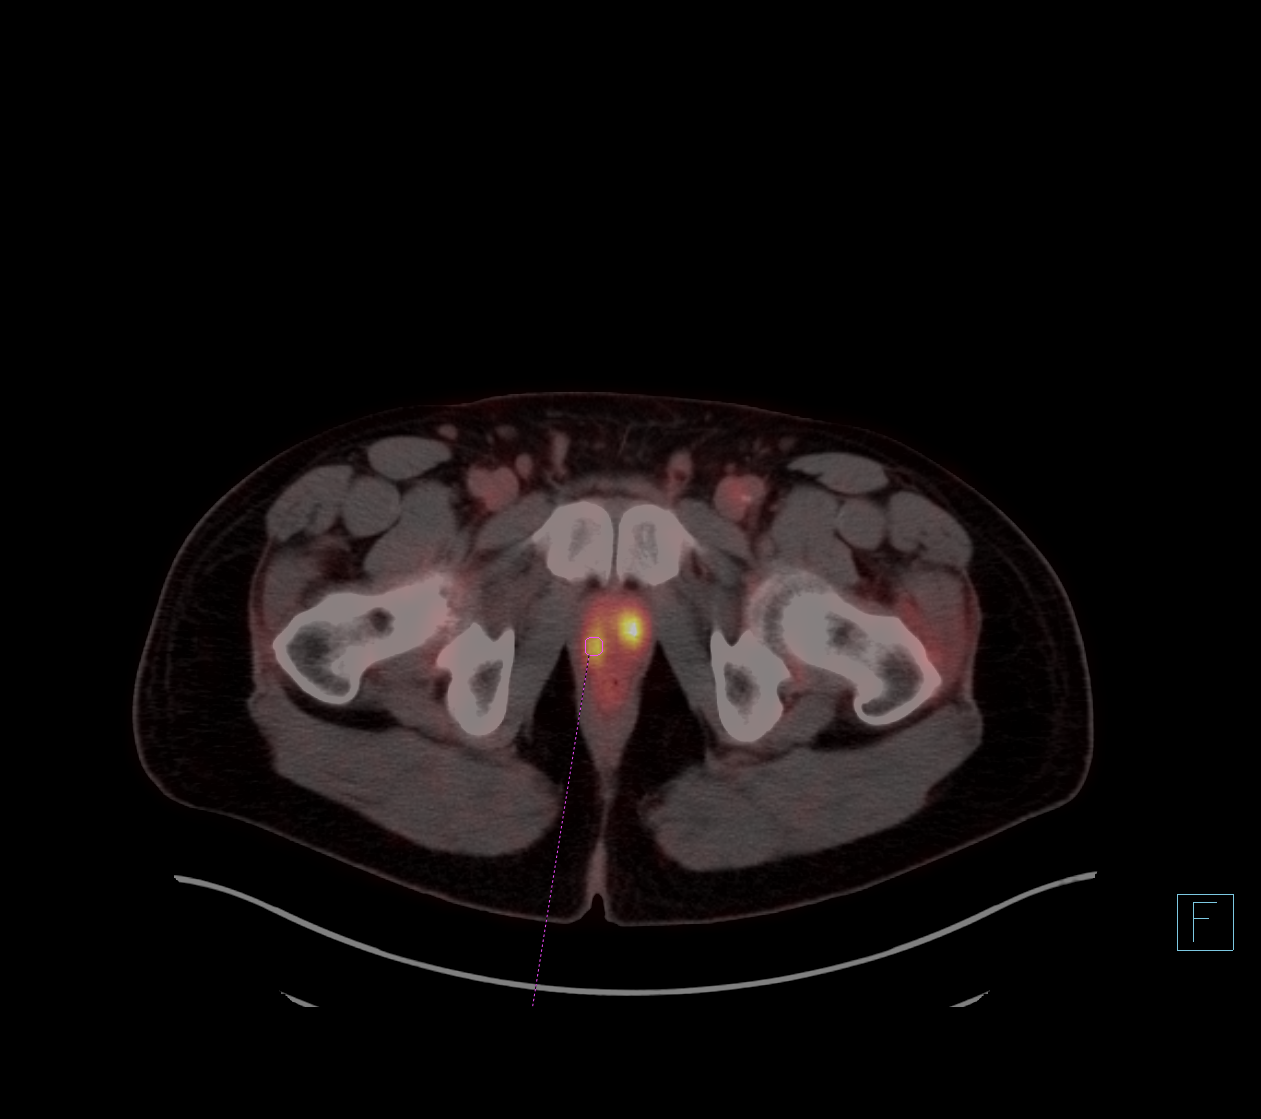

[2 of 2 positions shown; findings below may reference images not displayed]

FINDINGS: NECK

No radiotracer activity in neck lymph nodes.

Incidental CT finding: None

CHEST

None

Incidental CT finding: None

ABDOMEN/PELVIS

Prostate: Hypermetabolic focus within the central LEFT lobe of the
prostate gland with SUV max equal 19.4. Activity within the RIGHT
lobe is posterior towards the apical region with SUV max equal 12.5.

Lymph nodes: No abnormal radiotracer accumulation within pelvic or
abdominal nodes.

Liver: No evidence of liver metastasis

Incidental CT finding: None

SKELETON

No focal  activity to suggest skeletal metastasis.
IMPRESSION: 1. Two focal areas of intense radiotracer activity within the
prostate gland are consistent with primary prostate carcinoma.
2. No evidence of metastatic adenopathy in the pelvis or periaortic
retroperitoneum.
3. No evidence of visceral metastasis or skeletal metastasis.

## 2023-12-05 ENCOUNTER — Ambulatory Visit (INDEPENDENT_AMBULATORY_CARE_PROVIDER_SITE_OTHER): Payer: 59

## 2023-12-05 ENCOUNTER — Ambulatory Visit
Admission: EM | Admit: 2023-12-05 | Discharge: 2023-12-05 | Disposition: A | Discharge status: Home / Self Care | Payer: 59 | Attending: Physician Assistant | Admitting: Physician Assistant

## 2023-12-05 ENCOUNTER — Encounter: Payer: Self-pay | Admitting: Emergency Medicine

## 2023-12-05 ENCOUNTER — Other Ambulatory Visit: Payer: Self-pay

## 2023-12-05 DIAGNOSIS — R051 Acute cough: Secondary | ICD-10-CM

## 2023-12-05 DIAGNOSIS — J40 Bronchitis, not specified as acute or chronic: Secondary | ICD-10-CM

## 2023-12-05 DIAGNOSIS — J329 Chronic sinusitis, unspecified: Secondary | ICD-10-CM

## 2023-12-05 MED ORDER — PREDNISONE 20 MG PO TABS: 40.0000 mg | ORAL_TABLET | Freq: Every day | ORAL | 0 refills | Status: AC

## 2023-12-05 MED ORDER — AMOXICILLIN-POT CLAVULANATE 875-125 MG PO TABS: 1.0000 | ORAL_TABLET | Freq: Two times a day (BID) | ORAL | 0 refills | Status: DC

## 2023-12-05 NOTE — ED Triage Notes (Signed)
 Pt here for cough and nasal congestion with scratchy throat x 10 days

## 2023-12-09 NOTE — ED Provider Notes (Signed)
 [name]    CSN: [id number] Arrival date & time: 12/05/23  1056      History   Chief Complaint Chief Complaint  Patient presents with   Cough    HPI Erik Gill is a 64 y.o. male.   Patient here today for evaluation of cough and congestion that has had for the last 10 days.  He has not had fever.  He denies any vomiting or diarrhea.  He has tried over-the-counter medication without resolution.  The history is provided by the patient.  Cough Associated symptoms: no chills, no ear pain, no eye discharge, no fever, no shortness of breath and no sore throat     Past Medical History:  Diagnosis Date   Elevated PSA    GERD (gastroesophageal reflux disease)    Headache    sinus related   Hypertension    Nocturia    Prostate cancer [facility])    Wears glasses     Patient Active Problem List   Diagnosis Date Noted   Family history of prostate cancer 03/01/2022   Genetic testing 03/01/2022   Elevated PSA 11/05/2021   Malignant neoplasm of prostate (HCC) 10/08/2017   Sinusitis, chronic 07/15/2016   Smoker 07/15/2016   Essential hypertension 07/15/2016    Past Surgical History:  Procedure Laterality Date   HERNIA REPAIR Right    LYMPHADENECTOMY Bilateral 11/14/2021   Procedure: LYMPHADENECTOMY, PELVIC;  Surgeon: Renda Glance, MD;  Location: [facility];  Service: Urology;  Laterality: Bilateral;   PROSTATE BIOPSY N/A 08/29/2017   Procedure: BIOPSY TRANSRECTAL ULTRASONIC PROSTATE (TUBP);  Surgeon: Devere Lonni Righter, MD;  Location[facility];  Service: Urology;  Laterality: N/A;   ROBOT ASSISTED LAPAROSCOPIC RADICAL PROSTATECTOMY N/A 11/14/2021   Procedure: XI ROBOTIC ASSISTED LAPAROSCOPIC RADICAL PROSTATECTOMY LEVEL 1;  Surgeon: Renda Glance, MD;  Location: [facility];  Service: Urology;  Laterality: N/A;       Home Medications    Prior to Admission medications   Medication Sig Start Date End Date Taking? Authorizing Provider   amoxicillin -clavulanate (AUGMENTIN ) 875-125 MG tablet Take 1 tablet by mouth every 12 (twelve) hours. 12/05/23  Yes Billy Asberry FALCON, PA-C  predniSONE  (DELTASONE ) 20 MG tablet Take 2 tablets (40 mg total) by mouth daily with breakfast for 5 days. 12/05/23 12/10/23 Yes Billy Asberry FALCON, PA-C  amLODipine -benazepril  (LOTREL) 10-40 MG capsule Take 1 capsule by mouth daily. 09/28/16   [provider]  ASPIRIN PO Take 81 mg by mouth.    [provider]  benzonatate  (TESSALON ) 100 MG capsule Take 1 capsule (100 mg total) by mouth every 8 (eight) hours. Patient not taking: Reported on 08/01/2022 04/12/22   Raspet, Erin K, PA-C  cetirizine (ZYRTEC ALLERGY) 10 MG tablet Take 10 mg by mouth daily.    [provider]  cetirizine (ZYRTEC) 10 MG tablet Take 10 mg by mouth daily.    [provider]  clotrimazole -betamethasone  (LOTRISONE ) cream Apply to affected area 2 times daily prn 08/22/23   Billy Asberry FALCON, PA-C  Dextromethorphan -guaiFENesin  (MUCINEX  DM MAXIMUM STRENGTH PO) Take 1,200 mg by mouth 2 (two) times daily.    [provider]  docusate sodium  (COLACE) 100 MG capsule Take 1 capsule (100 mg total) by mouth 2 (two) times daily. Patient not taking: Reported on 02/28/2022 11/14/21   Cory Palma, PA-C  fluticasone  (FLONASE ) 50 MCG/ACT nasal spray Place 1 spray into both nostrils daily for 3 days. 05/19/22 09/14/22  Hazen Darryle BRAVO, FNP  fluticasone  (FLONASE ) 50  MCG/ACT nasal spray Place 2 sprays into both nostrils daily.    [provider]  guaiFENesin  200 MG tablet Take 1 tablet (200 mg total) by mouth every 4 (four) hours as needed for cough or to loosen phlegm. Patient not taking: Reported on 09/14/2022 05/19/22   Hazen Darryle BRAVO, FNP  meloxicam  (MOBIC ) 15 MG tablet Take 15 mg by mouth daily.    [provider]  metoprolol  succinate (TOPROL -XL) 100 MG 24 hr tablet Take 100 mg by mouth daily. 08/21/22   [provider]   Naphazoline-Pheniramine (OPCON-A ) 0.027-0.315 % SOLN Place 1 drop into both eyes daily as needed (allergies).    [provider]  Naphazoline-Pheniramine (OPCON-A ) 0.027-0.315 % SOLN 1 drop into affected eye  as needed Ophthalmic Four times a day    [provider]  tadalafil (CIALIS) 20 MG tablet Take 20 mg by mouth daily as needed for erectile dysfunction. 09/27/15   [provider]  triamcinolone  cream (KENALOG ) 0.1 % Apply 1 Application topically 2 (two) times daily.    [provider]  UNKNOWN TO PATIENT Suppliment, PRN.    [provider]    Family History Family History  Problem Relation Age of Onset   Cancer Father        colon cancer   Cancer Brother        prostate   Cancer Brother        prostate   Cancer Cousin        prostate cancer/maternal first cousin    Social History Social History   Tobacco Use   Smoking status: Every Day    Current packs/day: 0.50    Average packs/day: 0.5 packs/day for 1 year (0.5 ttl pk-yrs)    Types: Cigarettes   Smokeless tobacco: Never   Tobacco comments:    0.5 PP2D  Vaping Use   Vaping status: Never Used  Substance Use Topics   Alcohol use: Not Currently    Comment: OCCASIONAL   Drug use: No     Allergies   Patient has no known allergies.   Review of Systems Review of Systems  Constitutional:  Negative for chills and fever.  HENT:  Positive for congestion. Negative for ear pain and sore throat.   Eyes:  Negative for discharge and redness.  Respiratory:  Positive for cough. Negative for shortness of breath.   Gastrointestinal:  Negative for abdominal pain, diarrhea, nausea and vomiting.     Physical Exam Triage Vital Signs ED Triage Vitals  Encounter Vitals Group     BP 12/05/23 1109 (!) 170/93     Systolic BP Percentile --      Diastolic BP Percentile --      Pulse Rate 12/05/23 1109 76     Resp 12/05/23 1109 18     Temp 12/05/23 1109 98.6 F (37 C)     Temp  Source 12/05/23 1109 Oral     SpO2 12/05/23 1109 98 %     Weight --      Height --      Head Circumference --      Peak Flow --      Pain Score 12/05/23 1110 0     Pain Loc --      Pain Education --      Exclude from Growth Chart --    No data found.  Updated Vital Signs BP (!) 170/93 (BP Location: Left Arm)   Pulse 76   Temp 98.6 F (37 C) (  Oral)   Resp 18   SpO2 98%   Visual Acuity Right Eye Distance:   Left Eye Distance:   Bilateral Distance:    Right Eye Near:   Left Eye Near:    Bilateral Near:     Physical Exam Vitals and nursing note reviewed.  Constitutional:      General: He is not in acute distress.    Appearance: Normal appearance. He is not ill-appearing.  HENT:     Head: Normocephalic and atraumatic.     Right Ear: Tympanic membrane normal.     Left Ear: Tympanic membrane normal.     Nose: Congestion present.     Mouth/Throat:     Mouth: Mucous membranes are moist.     Pharynx: Oropharynx is clear. No oropharyngeal exudate or posterior oropharyngeal erythema.  Eyes:     Conjunctiva/sclera: Conjunctivae normal.  Cardiovascular:     Rate and Rhythm: Normal rate and regular rhythm.     Heart sounds: Normal heart sounds. No murmur heard. Pulmonary:     Effort: Pulmonary effort is normal. No respiratory distress.     Breath sounds: Normal breath sounds. No wheezing, rhonchi or rales.  Skin:    General: Skin is warm and dry.  Neurological:     Mental Status: He is alert.  Psychiatric:        Mood and Affect: Mood normal.        Thought Content: Thought content normal.      UC Treatments / Results  Labs (all labs ordered are listed, but only abnormal results are displayed) Labs Reviewed - No data to display  EKG   Radiology No results found.  Procedures Procedures (including critical care time)  Medications Ordered in UC Medications - No data to display  Initial Impression / Assessment and Plan / UC Course  I have reviewed the  triage vital signs and the nursing notes.  Pertinent labs & imaging results that were available during my care of the patient were reviewed by me and considered in my medical decision making (see chart for details).    Will treat to cover sinobronchitis as chest x-ray is clear.  Steroid burst and Augmentin  prescribed.  Advised follow-up if no gradual improvement or with any further concerns.  Final Clinical Impressions(s) / UC Diagnoses   Final diagnoses:  Acute cough  Sinobronchitis   Discharge Instructions   None    ED Prescriptions     Medication Sig Dispense Auth. Provider   predniSONE  (DELTASONE ) 20 MG tablet Take 2 tablets (40 mg total) by mouth daily with breakfast for 5 days. 10 tablet Billy Stabs F, PA-C   amoxicillin -clavulanate (AUGMENTIN ) 875-125 MG tablet Take 1 tablet by mouth every 12 (twelve) hours. 14 tablet Billy Stabs FALCON, PA-C      PDMP not reviewed this encounter.   Billy Stabs FALCON, PA-C 12/09/23 [phone]

## 2024-02-19 ENCOUNTER — Encounter: Payer: Self-pay | Admitting: Emergency Medicine

## 2024-02-19 ENCOUNTER — Ambulatory Visit
Admission: EM | Admit: 2024-02-19 | Discharge: 2024-02-19 | Disposition: A | Discharge status: Home / Self Care | Attending: Internal Medicine | Admitting: Internal Medicine

## 2024-02-19 DIAGNOSIS — N3001 Acute cystitis with hematuria: Secondary | ICD-10-CM | POA: Insufficient documentation

## 2024-02-19 DIAGNOSIS — R030 Elevated blood-pressure reading, without diagnosis of hypertension: Secondary | ICD-10-CM | POA: Diagnosis present

## 2024-02-19 LAB — POCT URINALYSIS DIP (MANUAL ENTRY)
Glucose, UA: NEGATIVE mg/dL
Nitrite, UA: POSITIVE — AB
Protein Ur, POC: >=300 mg/dL — AB
Spec Grav, UA: 1.02 (ref 1.010–1.025)
Urobilinogen, UA: 1 U/dL
pH, UA: 6.5 (ref 5.0–8.0)

## 2024-02-19 MED ORDER — SULFAMETHOXAZOLE-TRIMETHOPRIM 800-160 MG PO TABS: 1.0000 | ORAL_TABLET | Freq: Two times a day (BID) | ORAL | 0 refills | Status: AC

## 2024-02-19 NOTE — ED Triage Notes (Signed)
 Pt present with hematuria that he first noticed on yesterday. Pt states he has slight pain in lower back. He states, "a clump of blood will come out then urine."

## 2024-02-19 NOTE — Discharge Instructions (Signed)
 I have prescribed you an antibiotic for urinary tract infection.  Urine culture is pending.

## 2024-02-19 NOTE — ED Provider Notes (Signed)
 [name]    CSN: [id number] Arrival date & time: 02/19/24  0910      History   Chief Complaint Chief Complaint  Patient presents with   Hematuria    HPI Erik Gill is a 63 y.o. male.   Patient presents with hematuria that he noticed yesterday.  Patient is not reporting any urinary burning, urinary frequency but is reporting right lower back pain so is not sure if this is attributed to his sciatica.  Denies fever or chills.  Patient reports that he wears a "pad" during the workday given that he has a history of prostate surgery and has leakage.  He is not sure if this could be related to infection.  Denies fever or chills. Denies history of kidney stones or frequent UTI's. Reports that he has not been sexually active in about 6 months and denies exposure to STD.  Patient's blood pressure is also elevated and reports that he has been taking his medication as prescribed.  He is not reporting any chest pain, shortness of breath, dizziness, blurred vision, nausea, vomiting.  Reports that he does not typically monitor his blood pressure at home.   Hematuria    Past Medical History:  Diagnosis Date   Elevated PSA    GERD (gastroesophageal reflux disease)    Headache    sinus related   Hypertension    Nocturia    Prostate cancer [facility])    Wears glasses     Patient Active Problem List   Diagnosis Date Noted   Family history of prostate cancer 03/01/2022   Genetic testing 03/01/2022   Elevated PSA 11/05/2021   Malignant neoplasm of prostate (HCC) 10/08/2017   Sinusitis, chronic 07/15/2016   Smoker 07/15/2016   Essential hypertension 07/15/2016    Past Surgical History:  Procedure Laterality Date   HERNIA REPAIR Right    LYMPHADENECTOMY Bilateral 11/14/2021   Procedure: LYMPHADENECTOMY, PELVIC;  Surgeon: Heloise Purpura, MD;  Location: [facility];  Service: Urology;  Laterality: Bilateral;   PROSTATE BIOPSY N/A 08/29/2017   Procedure: BIOPSY TRANSRECTAL  ULTRASONIC PROSTATE (TUBP);  Surgeon: Rene Paci, MD;  Location: [facility];  Service: Urology;  Laterality: N/A;   ROBOT ASSISTED LAPAROSCOPIC RADICAL PROSTATECTOMY N/A 11/14/2021   Procedure: XI ROBOTIC ASSISTED LAPAROSCOPIC RADICAL PROSTATECTOMY LEVEL 1;  Surgeon: Heloise Purpura, MD;  Location: [facility];  Service: Urology;  Laterality: N/A;       Home Medications    Prior to Admission medications   Medication Sig Start Date End Date Taking? Authorizing Provider  amLODipine-benazepril (LOTREL) 10-40 MG capsule Take 1 capsule by mouth daily. 09/28/16  Yes [provider]  sulfamethoxazole-trimethoprim (BACTRIM DS) 800-160 MG tablet Take 1 tablet by mouth 2 (two) times daily for 7 days. 02/19/24 02/26/24 Yes Magnolia Mattila, Acie Fredrickson, FNP  amoxicillin-clavulanate (AUGMENTIN) 875-125 MG tablet Take 1 tablet by mouth every 12 (twelve) hours. 12/05/23   Tomi Bamberger, PA-C  ASPIRIN PO Take 81 mg by mouth.    [provider]  benzonatate (TESSALON) 100 MG capsule Take 1 capsule (100 mg total) by mouth every 8 (eight) hours. Patient not taking: Reported on 08/01/2022 04/12/22   Raspet, Noberto Retort, PA-C  cetirizine (ZYRTEC ALLERGY) 10 MG tablet Take 10 mg by mouth daily.    [provider]  cetirizine (ZYRTEC) 10 MG tablet Take 10 mg by mouth daily.    [provider]  clotrimazole-betamethasone (LOTRISONE) cream Apply to affected area 2 times daily prn 08/22/23  Tomi Bamberger, PA-C  Dextromethorphan-guaiFENesin [facility] DM MAXIMUM STRENGTH PO) Take 1,200 mg by mouth 2 (two) times daily.    [provider]  docusate sodium (COLACE) 100 MG capsule Take 1 capsule (100 mg total) by mouth 2 (two) times daily. Patient not taking: Reported on 02/28/2022 11/14/21   Harrie Foreman, PA-C  fluticasone [facility]) 50 MCG/ACT nasal spray Place 1 spray into both nostrils daily for 3 days. 05/19/22 09/14/22  Gustavus Bryant, FNP  fluticasone (FLONASE) 50  MCG/ACT nasal spray Place 2 sprays into both nostrils daily.    [provider]  guaiFENesin 200 MG tablet Take 1 tablet (200 mg total) by mouth every 4 (four) hours as needed for cough or to loosen phlegm. Patient not taking: Reported on 09/14/2022 05/19/22   Gustavus Bryant, FNP  meloxicam (MOBIC) 15 MG tablet Take 15 mg by mouth daily.    [provider]  metoprolol succinate (TOPROL-XL) 100 MG 24 hr tablet Take 100 mg by mouth daily. 08/21/22   [provider]  Naphazoline-Pheniramine (OPCON-A) 0.027-0.315 % SOLN Place 1 drop into both eyes daily as needed (allergies).    [provider]  Naphazoline-Pheniramine (OPCON-A) 0.027-0.315 % SOLN 1 drop into affected eye  as needed Ophthalmic Four times a day    [provider]  tadalafil (CIALIS) 20 MG tablet Take 20 mg by mouth daily as needed for erectile dysfunction. 09/27/15   [provider]  triamcinolone cream (KENALOG) 0.1 % Apply 1 Application topically 2 (two) times daily.    [provider]  UNKNOWN TO PATIENT "Suppliment, PRN".    [provider]    Family History Family History  Problem Relation Age of Onset   Cancer Father        colon cancer   Cancer Brother        prostate   Cancer Brother        prostate   Cancer Cousin        prostate cancer/maternal first cousin    Social History Social History   Tobacco Use   Smoking status: Every Day    Current packs/day: 0.50    Average packs/day: 0.5 packs/day for 1 year (0.5 ttl pk-yrs)    Types: Cigarettes   Smokeless tobacco: Never   Tobacco comments:    0.5 PP2D  Vaping Use   Vaping status: Never Used  Substance Use Topics   Alcohol use: Not Currently    Comment: OCCASIONAL   Drug use: No     Allergies   Patient has no known allergies.   Review of Systems Review of Systems Per HPI  Physical Exam Triage Vital Signs ED Triage Vitals  Encounter Vitals Group     BP 02/19/24 1130 (!)  177/103     Systolic BP Percentile --      Diastolic BP Percentile --      Pulse Rate 02/19/24 1130 69     Resp 02/19/24 1130 18     Temp 02/19/24 1130 98.4 F (36.9 C)     Temp Source 02/19/24 1130 Oral     SpO2 02/19/24 1130 98 %     Weight 02/19/24 1129 199 lb 15.3 oz (90.7 kg)     Height 02/19/24 1129 5\' 8"  (1.727 m)     Head Circumference --      Peak Flow --      Pain Score 02/19/24 1129 0     Pain Loc --  Pain Education --      Exclude from Growth Chart --    No data found.  Updated Vital Signs BP (!) 177/103 (BP Location: Left Arm)   Pulse 69   Temp 98.4 F (36.9 C) (Oral)   Resp 18   Ht 5\' 8"  (1.727 m)   Wt 199 lb 15.3 oz (90.7 kg)   SpO2 98%   BMI 30.40 kg/m   Visual Acuity Right Eye Distance:   Left Eye Distance:   Bilateral Distance:    Right Eye Near:   Left Eye Near:    Bilateral Near:     Physical Exam Constitutional:      General: He is not in acute distress.    Appearance: Normal appearance. He is not toxic-appearing or diaphoretic.  HENT:     Head: Normocephalic and atraumatic.  Eyes:     Extraocular Movements: Extraocular movements intact.     Conjunctiva/sclera: Conjunctivae normal.  Pulmonary:     Effort: Pulmonary effort is normal.  Neurological:     General: No focal deficit present.     Mental Status: He is alert and oriented to person, place, and time. Mental status is at baseline.     Cranial Nerves: Cranial nerves 2-12 are intact.     Sensory: Sensation is intact.     Motor: Motor function is intact.     Coordination: Coordination is intact.     Gait: Gait is intact.     Deep Tendon Reflexes: Reflexes are normal and symmetric.  Psychiatric:        Mood and Affect: Mood normal.        Behavior: Behavior normal.        Thought Content: Thought content normal.        Judgment: Judgment normal.      UC Treatments / Results  Labs (all labs ordered are listed, but only abnormal results are displayed) Labs Reviewed   POCT URINALYSIS DIP (MANUAL ENTRY) - Abnormal; Notable for the following components:      Result Value   Color, UA red (*)    Clarity, UA cloudy (*)    Bilirubin, UA moderate (*)    Ketones, POC UA trace (5) (*)    Blood, UA large (*)    Protein Ur, POC >=300 (*)    Nitrite, UA Positive (*)    Leukocytes, UA Large (3+) (*)    All other components within normal limits  URINE CULTURE    EKG   Radiology No results found.  Procedures Procedures (including critical care time)  Medications Ordered in UC Medications - No data to display  Initial Impression / Assessment and Plan / UC Course  I have reviewed the triage vital signs and the nursing notes.  Pertinent labs & imaging results that were available during my care of the patient were reviewed by me and considered in my medical decision making (see chart for details).     UA indicating urinary tract infection.  Will treat with Bactrim. Crcl 85 so no dosage adjustment necessary. Urine culture pending.  STD testing deferred given patient reports he has not been sexually active in quite some time and has no concern for STD exposure.  Advised strict ER precautions.  Blood pressure is elevated but this appears baseline.  Encouraged patient to monitor blood pressure closely over the next 24 to 48 hours and follow-up with urgent care or PCP if it remains elevated.  No signs of hypertensive urgency on exam.  Advised strict ER precautions regarding blood pressure.  Patient verbalized understanding and was agreeable with plan. Final Clinical Impressions(s) / UC Diagnoses   Final diagnoses:  Acute cystitis with hematuria     Discharge Instructions      I have prescribed you an antibiotic for urinary tract infection.  Urine culture is pending.    ED Prescriptions     Medication Sig Dispense Auth. Provider   sulfamethoxazole-trimethoprim (BACTRIM DS) 800-160 MG tablet Take 1 tablet by mouth 2 (two) times daily for 7 days. 14  tablet [place], Acie Fredrickson, [place]      PDMP not reviewed this encounter.   Gustavus Bryant, [place] 02/19/24 1259

## 2024-02-20 LAB — URINE CULTURE: Culture: NO GROWTH

## 2024-04-11 ENCOUNTER — Emergency Department (HOSPITAL_COMMUNITY)
Admission: EM | Admit: 2024-04-11 | Discharge: 2024-04-12 | Disposition: A | Discharge status: Home / Self Care | Attending: Emergency Medicine | Admitting: Emergency Medicine

## 2024-04-11 DIAGNOSIS — Z79899 Other long term (current) drug therapy: Secondary | ICD-10-CM | POA: Insufficient documentation

## 2024-04-11 DIAGNOSIS — Z9852 Vasectomy status: Secondary | ICD-10-CM | POA: Diagnosis not present

## 2024-04-11 DIAGNOSIS — D649 Anemia, unspecified: Secondary | ICD-10-CM | POA: Diagnosis present

## 2024-04-11 LAB — BASIC METABOLIC PANEL WITH GFR
Anion gap: 9 (ref 5–15)
BUN: 19 mg/dL (ref 8–23)
CO2: 23 mmol/L (ref 22–32)
Calcium: 9.5 mg/dL (ref 8.9–10.3)
Chloride: 105 mmol/L (ref 98–111)
Creatinine, Ser: 1.27 mg/dL — ABNORMAL HIGH (ref 0.61–1.24)
GFR, Estimated: >60 mL/min (ref >60)
Glucose, Bld: 98 mg/dL (ref 70–99)
Potassium: 4.2 mmol/L (ref 3.5–5.1)
Sodium: 137 mmol/L (ref 135–145)

## 2024-04-11 LAB — URINALYSIS, ROUTINE W REFLEX MICROSCOPIC
Bilirubin Urine: NEGATIVE
Glucose, UA: NEGATIVE mg/dL
Hgb urine dipstick: NEGATIVE
Ketones, ur: NEGATIVE mg/dL
Leukocytes,Ua: NEGATIVE
Nitrite: NEGATIVE
Protein, ur: NEGATIVE mg/dL
Specific Gravity, Urine: 1.004 — ABNORMAL LOW (ref 1.005–1.030)
pH: 7 (ref 5.0–8.0)

## 2024-04-11 LAB — CBC
HCT: 25.2 % — ABNORMAL LOW (ref 39.0–52.0)
Hemoglobin: 7.5 g/dL — ABNORMAL LOW (ref 13.0–17.0)
MCH: 23.6 pg — ABNORMAL LOW (ref 26.0–34.0)
MCHC: 29.8 g/dL — ABNORMAL LOW (ref 30.0–36.0)
MCV: 79.2 fL — ABNORMAL LOW (ref 80.0–100.0)
Platelets: 349 10*3/uL (ref 150–400)
RBC: 3.18 MIL/uL — ABNORMAL LOW (ref 4.22–5.81)
RDW: 16.5 % — ABNORMAL HIGH (ref 11.5–15.5)
WBC: 7.6 10*3/uL (ref 4.0–10.5)
nRBC: 0 % (ref 0.0–0.2)

## 2024-04-11 LAB — POC OCCULT BLOOD, ED: Fecal Occult Bld: NEGATIVE

## 2024-04-11 LAB — PREPARE RBC (CROSSMATCH)

## 2024-04-11 MED ORDER — SODIUM CHLORIDE 0.9% IV SOLUTION: Freq: Once | INTRAVENOUS | Status: AC

## 2024-04-11 MED ORDER — FERROUS SULFATE 325 (65 FE) MG PO TABS: 325.0000 mg | ORAL_TABLET | Freq: Every day | ORAL | 1 refills | Status: DC

## 2024-04-11 MED ORDER — DOCUSATE SODIUM 100 MG PO CAPS: 100.0000 mg | ORAL_CAPSULE | Freq: Every day | ORAL | 0 refills | Status: AC

## 2024-04-11 NOTE — Discharge Instructions (Addendum)
 You need to have your hemoglobin level rechecked this week by your doctor's office.  You will also need to have it checked regularly in the future to make sure that your blood counts are stable and not dropping.  Your doctor may need to run other tests to determine the cause of your anemia.    You should also discuss a colonoscopy screening with your doctor.  I prescribed you iron tablets to begin taking at home.  These will likely cause your stool to look dark.  They may cause some constipation.  I prescribed you a stool softener to take with them.  Iron will help your body produce more blood.

## 2024-04-11 NOTE — ED Provider Notes (Signed)
 [place] EMERGENCY DEPARTMENT AT [facility] Provider Note   CSN: [id number] Arrival date & time: 04/11/24  1745     History  Chief Complaint  Patient presents with   abnormal labs    Erik Gill is a 63 y.o. male with a history of enlarged prostate status post prostatectomy presented to the ED with concern for anemia.  Patient reports that his PCP was doing a checkup because the patient been feeling fatigued for a few weeks and found that he was anemic with hemoglobin 7.5.  Was told to come to the ED.  Patient reports he has had about a month of feeling generally fatigued.  He denies significantly worsening shortness of breath or dyspnea, or active lightheadedness.  He denies any blood in his stool or black or tarry stools.  He does report that he had hematuria about a month ago, for which he saw a urologist.  He said the hematuria has resolved.  He is not on blood thinners.  He used to take regular NSAIDs Aleve for sciatica but stopped this about 2 months ago.  He denies issues of ongoing gastric pain or peptic ulcer.  He has never had a colonoscopy.  HPI     Home Medications Prior to Admission medications   Medication Sig Start Date End Date Taking? Authorizing Provider  docusate sodium  (COLACE) 100 MG capsule Take 1 capsule (100 mg total) by mouth daily. 04/11/24 06/10/24 Yes Roemello Speyer, Janalyn Me, MD  ferrous sulfate 325 (65 FE) MG tablet Take 1 tablet (325 mg total) by mouth daily. 04/11/24 06/10/24 Yes Ashonti Leandro, Janalyn Me, MD  amLODipine -benazepril  (LOTREL) 10-40 MG capsule Take 1 capsule by mouth daily. 09/28/16   [provider]  amoxicillin -clavulanate (AUGMENTIN ) 875-125 MG tablet Take 1 tablet by mouth every 12 (twelve) hours. 12/05/23   Vernestine Gondola, PA-C  ASPIRIN PO Take 81 mg by mouth.    [provider]  benzonatate  (TESSALON ) 100 MG capsule Take 1 capsule (100 mg total) by mouth every 8 (eight) hours. Patient not taking: Reported on 08/01/2022  04/12/22   Raspet, Erin K, PA-C  cetirizine (ZYRTEC ALLERGY) 10 MG tablet Take 10 mg by mouth daily.    [provider]  cetirizine (ZYRTEC) 10 MG tablet Take 10 mg by mouth daily.    [provider]  clotrimazole -betamethasone  (LOTRISONE ) cream Apply to affected area 2 times daily prn 08/22/23   Vernestine Gondola, PA-C  Dextromethorphan-guaiFENesin  (MUCINEX  DM MAXIMUM STRENGTH PO) Take 1,200 mg by mouth 2 (two) times daily.    [provider]  docusate sodium  (COLACE) 100 MG capsule Take 1 capsule (100 mg total) by mouth 2 (two) times daily. Patient not taking: Reported on 02/28/2022 11/14/21   Carrolyn Clan, PA-C  fluticasone  (FLONASE ) 50 MCG/ACT nasal spray Place 1 spray into both nostrils daily for 3 days. 05/19/22 09/14/22  Dodson Freestone, FNP  fluticasone  (FLONASE ) 50 MCG/ACT nasal spray Place 2 sprays into both nostrils daily.    [provider]  guaiFENesin  200 MG tablet Take 1 tablet (200 mg total) by mouth every 4 (four) hours as needed for cough or to loosen phlegm. Patient not taking: Reported on 09/14/2022 05/19/22   Dodson Freestone, FNP  meloxicam  (MOBIC ) 15 MG tablet Take 15 mg by mouth daily.    [provider]  metoprolol  succinate (TOPROL -XL) 100 MG 24 hr tablet Take 100 mg by mouth daily. 08/21/22   [provider]  Naphazoline-Pheniramine (OPCON-A ) 0.027-0.315 % SOLN Place  1 drop into both eyes daily as needed (allergies).    [provider]  Naphazoline-Pheniramine (OPCON-A ) 0.027-0.315 % SOLN 1 drop into affected eye  as needed Ophthalmic Four times a day    [provider]  tadalafil (CIALIS) 20 MG tablet Take 20 mg by mouth daily as needed for erectile dysfunction. 09/27/15   [provider]  triamcinolone  cream (KENALOG ) 0.1 % Apply 1 Application topically 2 (two) times daily.    [provider]  UNKNOWN TO PATIENT "Suppliment, PRN".    [provider]      Allergies    Patient has  no known allergies.    Review of Systems   Review of Systems  Physical Exam Updated Vital Signs BP (!) 158/79 (BP Location: Right Arm)   Pulse 65   Temp 97.7 F (36.5 C) (Oral)   Resp 18   SpO2 99%  Physical Exam Constitutional:      General: He is not in acute distress. HENT:     Head: Normocephalic and atraumatic.  Eyes:     Conjunctiva/sclera: Conjunctivae normal.     Pupils: Pupils are equal, round, and reactive to light.  Cardiovascular:     Rate and Rhythm: Normal rate and regular rhythm.  Pulmonary:     Effort: Pulmonary effort is normal. No respiratory distress.  Abdominal:     General: There is no distension.     Tenderness: There is no abdominal tenderness.  Genitourinary:    Comments: Rectal exam with no melena Skin:    General: Skin is warm and dry.  Neurological:     General: No focal deficit present.     Mental Status: He is alert. Mental status is at baseline.  Psychiatric:        Mood and Affect: Mood normal.        Behavior: Behavior normal.     ED Results / Procedures / Treatments   Labs (all labs ordered are listed, but only abnormal results are displayed) Labs Reviewed  BASIC METABOLIC PANEL WITH GFR - Abnormal; Notable for the following components:      Result Value   Creatinine, Ser 1.27 (*)    All other components within normal limits  CBC - Abnormal; Notable for the following components:   RBC 3.18 (*)    Hemoglobin 7.5 (*)    HCT 25.2 (*)    MCV 79.2 (*)    MCH 23.6 (*)    MCHC 29.8 (*)    RDW 16.5 (*)    All other components within normal limits  URINALYSIS, ROUTINE W REFLEX MICROSCOPIC  POC OCCULT BLOOD, ED  TYPE AND SCREEN  PREPARE RBC (CROSSMATCH)    EKG None  Radiology No results found.  Procedures .Critical Care  Performed by: Arvilla Birmingham, MD Authorized by: Arvilla Birmingham, MD   Critical care provider statement:    Critical care time (minutes):  30   Critical care time was exclusive of:  Separately  billable procedures and treating other patients   Critical care was necessary to treat or prevent imminent or life-threatening deterioration of the following conditions:  Circulatory failure   Critical care was time spent personally by me on the following activities:  Ordering and performing treatments and interventions, ordering and review of laboratory studies, ordering and review of radiographic studies, pulse oximetry, review of old charts, examination of patient and evaluation of patient's response to treatment     Medications Ordered in ED Medications  0.9 %  sodium chloride  infusion (Manually program via Guardrails IV Fluids) (has no administration in time range)    ED Course/ Medical Decision Making/ A&P Clinical Course as of 04/11/24 2300  Fri Apr 11, 2024  2059 Fecal Occult Blood, POC: NEGATIVE [MT]    Clinical Course User Index [MT] Arvilla Birmingham, MD                                 Medical Decision Making Amount and/or Complexity of Data Reviewed Labs: ordered. Decision-making details documented in ED Course.  Risk OTC drugs. Prescription drug management.   This patient presents to the ED with concern for general fatigue, anemia. This involves an extensive number of treatment options, and is a complaint that carries with it a high risk of complications and morbidity.  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  I ordered and personally interpreted labs.  The pertinent results include: Microcytic anemia with hemoglobin 7.5 and MCV 97.  BMP unremarkable.   The patient was maintained on a cardiac monitor.  I personally viewed and interpreted the cardiac monitored which showed an  underlying rhythm of: Sinus rhythm   I ordered medication including 1 unit red blood cell transfusion  I have reviewed the patients home medicines and have made adjustments as needed  Test Considered: No indication for emergent CT imaging of the abdomen pelvis or angiogram  After the interventions noted above, I reevaluated the patient and found that they have: improved   The patient is felt to be stable for discharge home but will need close outpatient follow-up.  Will start him on iron tablets.  He is recommended to have a hemoglobin rechecked this week and then again as an outpatient as his PCP works him up for potential causes of anemia.  I do have a low suspicion this is related to a large massive hematuria as he reports his urine had been clear, and microscopic hematuria is unlikely to be a cause of significant blood loss.  Ultimately, the difficulty is that we do not have a more recent hemoglobin trend for over 2 years, to determine whether this is acute or chronic.  We did determine and agree to proceed with 1 unit blood transfusion as the patient has felt more fatigued than normal for the past month.  We discussed the risks and benefits of a blood transfusion and the alternative of oral iron and close rechecks, and he prefers a blood transfusion at this time.   Disposition:  After consideration of the diagnostic results and the patients response to treatment, I feel that the patent would benefit from close outpatient follow-up         Final Clinical Impression(s) / ED Diagnoses Final diagnoses:  Anemia, unspecified type    Rx / DC Orders ED Discharge Orders          Ordered    ferrous sulfate 325 (65 FE) MG tablet  Daily        04/11/24 2245    docusate sodium  (COLACE) 100 MG capsule  Daily        04/11/24 2245              Jerald Molly  J, MD 04/11/24 2300

## 2024-04-11 NOTE — ED Triage Notes (Signed)
 Pt sent by PCP for hemoglobin of 7.5. Pt reports that he has been seeing some blood/clots in his urine- has already seen urologist for this. Pt does have some SHOB with exertion.

## 2024-04-11 NOTE — ED Notes (Signed)
Pt aware that a urine sample is needed.  

## 2024-04-12 NOTE — ED Notes (Signed)
 Pt continued to deny any changes.

## 2024-04-12 NOTE — ED Notes (Signed)
 This paramedic stayed in the room for the first 15 min of the transfusion. Pt denied any changes

## 2024-04-14 LAB — TYPE AND SCREEN
ABO/RH(D): O POS
Antibody Screen: NEGATIVE
Unit division: 0

## 2024-04-14 LAB — BPAM RBC
Blood Product Expiration Date: 202506102359
ISSUE DATE / TIME: 202505100019
Unit Type and Rh: 202506102359
Unit Type and Rh: 5100

## 2024-06-20 ENCOUNTER — Ambulatory Visit: Admission: EM | Admit: 2024-06-20 | Discharge: 2024-06-20 | Disposition: A | Discharge status: Home / Self Care

## 2024-06-20 DIAGNOSIS — R972 Elevated prostate specific antigen [PSA]: Secondary | ICD-10-CM | POA: Insufficient documentation

## 2024-06-20 DIAGNOSIS — J209 Acute bronchitis, unspecified: Secondary | ICD-10-CM

## 2024-06-20 DIAGNOSIS — Z720 Tobacco use: Secondary | ICD-10-CM | POA: Insufficient documentation

## 2024-06-20 DIAGNOSIS — E785 Hyperlipidemia, unspecified: Secondary | ICD-10-CM | POA: Insufficient documentation

## 2024-06-20 DIAGNOSIS — C61 Malignant neoplasm of prostate: Secondary | ICD-10-CM | POA: Insufficient documentation

## 2024-06-20 DIAGNOSIS — J039 Acute tonsillitis, unspecified: Secondary | ICD-10-CM | POA: Diagnosis not present

## 2024-06-20 DIAGNOSIS — E7800 Pure hypercholesterolemia, unspecified: Secondary | ICD-10-CM | POA: Insufficient documentation

## 2024-06-20 DIAGNOSIS — J301 Allergic rhinitis due to pollen: Secondary | ICD-10-CM

## 2024-06-20 DIAGNOSIS — N529 Male erectile dysfunction, unspecified: Secondary | ICD-10-CM | POA: Insufficient documentation

## 2024-06-20 DIAGNOSIS — J309 Allergic rhinitis, unspecified: Secondary | ICD-10-CM | POA: Insufficient documentation

## 2024-06-20 DIAGNOSIS — I10 Essential (primary) hypertension: Secondary | ICD-10-CM | POA: Insufficient documentation

## 2024-06-20 HISTORY — DX: Allergic rhinitis due to pollen: J30.1

## 2024-06-20 HISTORY — DX: Hyperlipidemia, unspecified: E78.5

## 2024-06-20 LAB — POC COVID19/FLU A&B COMBO
Covid Antigen, POC: NEGATIVE
Influenza A Antigen, POC: NEGATIVE
Influenza B Antigen, POC: NEGATIVE

## 2024-06-20 MED ORDER — ALBUTEROL SULFATE HFA 108 (90 BASE) MCG/ACT IN AERS: 2.0000 | INHALATION_SPRAY | RESPIRATORY_TRACT | 0 refills | Status: AC | PRN

## 2024-06-20 MED ORDER — AZITHROMYCIN 250 MG PO TABS: ORAL_TABLET | ORAL | 0 refills | Status: DC

## 2024-06-20 MED ORDER — IPRATROPIUM-ALBUTEROL 0.5-2.5 (3) MG/3ML IN SOLN
3.0000 mL | Freq: Once | RESPIRATORY_TRACT | Status: AC
  Administered 2024-06-20: 3 mL via RESPIRATORY_TRACT

## 2024-06-20 MED ORDER — PROMETHAZINE-DM 6.25-15 MG/5ML PO SYRP: 5.0000 mL | ORAL_SOLUTION | Freq: Three times a day (TID) | ORAL | 0 refills | Status: DC | PRN

## 2024-06-20 MED ORDER — PREDNISONE 20 MG PO TABS: 20.0000 mg | ORAL_TABLET | Freq: Two times a day (BID) | ORAL | 0 refills | Status: AC

## 2024-06-20 MED ORDER — ALBUTEROL SULFATE (2.5 MG/3ML) 0.083% IN NEBU
2.5000 mg | INHALATION_SOLUTION | RESPIRATORY_TRACT | Status: AC
  Administered 2024-06-20: 2.5 mg via RESPIRATORY_TRACT

## 2024-06-20 NOTE — ED Triage Notes (Signed)
 This started yesterday with scratchy throat (more on left) and some chest congestion, remains this morning with dry cough. I am a current smoker just in case that applies. I do have a PCP, Dr. Arloa, not contacted yet.

## 2024-06-20 NOTE — ED Provider Notes (Signed)
 [name]    CSN: [id number] Arrival date & time: 06/20/24  0855      History   Chief Complaint Chief Complaint  Patient presents with   Sinus Problem    HPI Erik Gill is a 63 y.o. male.   Patient with a history of GERD, chronic daily smoker, presents today with persistent coughing, wheezing, nasal congestion, and sore throat symptoms begin mildly on yesterday.  Patient's breathing wheezing were exacerbated overnight and he was unable to sleep.  Patient has no formal diagnosis of chronic bronchitis or COPD.  He reports that he did feel feverish but on presentation today he is afebrile.  He has not taken any over-the-counter medications since symptoms have started.  He denies any sick contacts.  Past Medical History:  Diagnosis Date   Elevated PSA    GERD (gastroesophageal reflux disease)    Headache    sinus related   Hypertension    Nocturia    Prostate cancer Erik Gill)    Wears glasses     Patient Active Problem List   Diagnosis Date Noted   Elevated prostate specific antigen (PSA) 06/20/2024   Pure hypercholesterolemia 06/20/2024   Hyperlipidemia 06/20/2024   Erectile dysfunction 06/20/2024   Allergic rhinitis due to pollen 06/20/2024   Allergic rhinitis 06/20/2024   Malignant tumor of prostate (HCC) 06/20/2024   HTN (hypertension) 06/20/2024   Tobacco abuse 06/20/2024   Family history of prostate cancer 03/01/2022   Genetic testing 03/01/2022   Elevated PSA 11/05/2021   Malignant neoplasm of prostate (HCC) 10/08/2017   Sinusitis, chronic 07/15/2016   Smoker 07/15/2016   Essential hypertension 07/15/2016    Past Surgical History:  Procedure Laterality Date   HERNIA REPAIR Right    LYMPHADENECTOMY Bilateral 11/14/2021   Procedure: LYMPHADENECTOMY, PELVIC;  Surgeon: Renda Glance, MD;  Location: [facility];  Service: Urology;  Laterality: Bilateral;   PROSTATE BIOPSY N/A 08/29/2017   Procedure: BIOPSY TRANSRECTAL ULTRASONIC PROSTATE (TUBP);   Surgeon: Devere Lonni Righter, MD;  Location: [facility];  Service: Urology;  Laterality: N/A;   ROBOT ASSISTED LAPAROSCOPIC RADICAL PROSTATECTOMY N/A 11/14/2021   Procedure: XI ROBOTIC ASSISTED LAPAROSCOPIC RADICAL PROSTATECTOMY LEVEL 1;  Surgeon: Renda Glance, MD;  Location: [facility];  Service: Urology;  Laterality: N/A;       Home Medications    Prior to Admission medications   Medication Sig Start Date End Date Taking? Authorizing Provider  albuterol  (VENTOLIN  HFA) 108 (90 Base) MCG/ACT inhaler Inhale 2 puffs into the lungs every 4 (four) hours as needed for wheezing or shortness of breath. 06/20/24  Yes Arloa Suzen RAMAN, NP  azithromycin  (ZITHROMAX ) 250 MG tablet Take 2 tabs PO x 1 dose, then 1 tab PO QD x 4 days 06/20/24  Yes Arloa Suzen RAMAN, NP  benzonatate  (TESSALON ) 200 MG capsule Take 100 mg by mouth 3 (three) times daily as needed. 04/21/24  Yes [provider]  Iron, Ferrous Sulfate , 325 (65 Fe) MG TABS 1 tablet Orally once a day 04/21/24  Yes [provider]  pantoprazole (PROTONIX) 40 MG tablet Take 40 mg by mouth daily. 04/21/24  Yes [provider]  predniSONE  (DELTASONE ) 20 MG tablet Take 1 tablet (20 mg total) by mouth 2 (two) times daily with a meal for 5 days. 06/20/24 06/25/24 Yes Arloa Suzen RAMAN, NP  promethazine -dextromethorphan (PROMETHAZINE -DM) 6.25-15 MG/5ML syrup Take 5 mLs by mouth 3 (three) times daily as needed. 06/20/24  Yes Arloa Suzen RAMAN, NP  amLODipine -benazepril  (LOTREL) 10-40 MG  capsule Take 1 capsule by mouth daily. 09/28/16   [provider]  amoxicillin -clavulanate (AUGMENTIN ) 875-125 MG tablet Take 1 tablet by mouth every 12 (twelve) hours. 12/05/23   Erik Asberry FALCON, PA-C  ASPIRIN PO Take 81 mg by mouth.    [provider]  benzonatate  (TESSALON ) 100 MG capsule Take 1 capsule (100 mg total) by mouth every 8 (eight) hours. Patient not taking: Reported on 08/01/2022 04/12/22   Raspet, Erin  K, PA-C  cetirizine (ZYRTEC ALLERGY) 10 MG tablet Take 10 mg by mouth daily.    [provider]  cetirizine (ZYRTEC) 10 MG tablet Take 10 mg by mouth daily.    [provider]  clotrimazole -betamethasone  (LOTRISONE ) cream Apply to affected area 2 times daily prn 08/22/23   Erik Asberry FALCON, PA-C  Dextromethorphan-guaiFENesin  (MUCINEX  DM MAXIMUM STRENGTH PO) Take 1,200 mg by mouth 2 (two) times daily.    [provider]  docusate sodium  (COLACE) 100 MG capsule Take 1 capsule (100 mg total) by mouth 2 (two) times daily. Patient not taking: Reported on 02/28/2022 11/14/21   Erik Palma, PA-C  ferrous sulfate  325 (65 FE) MG tablet Take 1 tablet (325 mg total) by mouth daily. 04/11/24 06/10/24  Cottie Donnice PARAS, MD  fluticasone  (FLONASE ) 50 MCG/ACT nasal spray Place 1 spray into both nostrils daily for 3 days. 05/19/22 09/14/22  Hazen Darryle BRAVO, FNP  fluticasone  (FLONASE ) 50 MCG/ACT nasal spray Place 2 sprays into both nostrils daily.    [provider]  guaiFENesin  200 MG tablet Take 1 tablet (200 mg total) by mouth every 4 (four) hours as needed for cough or to loosen phlegm. Patient not taking: Reported on 09/14/2022 05/19/22   Hazen Darryle BRAVO, FNP  meloxicam  (MOBIC ) 15 MG tablet Take 15 mg by mouth daily.    [provider]  metoprolol  succinate (TOPROL -XL) 100 MG 24 hr tablet Take 100 mg by mouth daily. 08/21/22   [provider]  Naphazoline-Pheniramine (OPCON-A ) 0.027-0.315 % SOLN Place 1 drop into both eyes daily as needed (allergies).    [provider]  Naphazoline-Pheniramine (OPCON-A ) 0.027-0.315 % SOLN 1 drop into affected eye  as needed Ophthalmic Four times a day    [provider]  tadalafil (CIALIS) 20 MG tablet Take 20 mg by mouth daily as needed for erectile dysfunction. 09/27/15   [provider]  triamcinolone  cream (KENALOG ) 0.1 % Apply 1 Application topically 2 (two) times daily.    [provider]   UNKNOWN TO PATIENT Suppliment, PRN.    [provider]    Family History Family History  Problem Relation Age of Onset   Cancer Father        colon cancer   Cancer Brother        prostate   Cancer Brother        prostate   Cancer Cousin        prostate cancer/maternal first cousin    Social History Social History   Tobacco Use   Smoking status: Every Day    Current packs/day: 0.50    Average packs/day: 0.5 packs/day for 1 year (0.5 ttl pk-yrs)    Types: Cigarettes   Smokeless tobacco: Never   Tobacco comments:    0.5 PP2D  Vaping Use   Vaping status: Never Used  Substance Use Topics   Alcohol use: Not Currently    Comment: OCCASIONAL   Drug use: No     Allergies   Patient has no known allergies.  Review of Systems Review of Systems  Constitutional:  Positive for fatigue and fever.  HENT:  Positive for sinus pressure, sinus pain and sore throat.   Respiratory:  Positive for shortness of breath.     Physical Exam Triage Vital Signs ED Triage Vitals  Encounter Vitals Group     BP 06/20/24 0948 (!) 144/77     Girls Systolic BP Percentile --      Girls Diastolic BP Percentile --      Boys Systolic BP Percentile --      Boys Diastolic BP Percentile --      Pulse Rate 06/20/24 0948 64     Resp 06/20/24 0948 (!) 24     Temp 06/20/24 0948 98.4 F (36.9 C)     Temp Source 06/20/24 0948 Oral     SpO2 06/20/24 0948 95 %     Weight 06/20/24 0945 199 lb 15.3 oz (90.7 kg)     Height 06/20/24 0945 5' 8 (1.727 m)     Head Circumference --      Peak Flow --      Pain Score 06/20/24 0943 5     Pain Loc --      Pain Education --      Exclude from Growth Chart --    No data found.  Updated Vital Signs BP (!) 144/77 (BP Location: Left Arm)   Pulse 60   Temp 98.4 F (36.9 C) (Oral)   Resp (!) 22   Ht 5' 8 (1.727 m)   Wt 199 lb 15.3 oz (90.7 kg)   SpO2 100%   BMI 30.40 kg/m   Visual Acuity Right Eye Distance:   Left Eye Distance:    Bilateral Distance:    Right Eye Near:   Left Eye Near:    Bilateral Near:     Physical Exam Constitutional:      Appearance: He is ill-appearing.  HENT:     Head: Normocephalic and atraumatic.     Nose: Congestion present. No rhinorrhea.  Eyes:     Extraocular Movements: Extraocular movements intact.     Pupils: Pupils are equal, round, and reactive to light.  Cardiovascular:     Rate and Rhythm: Normal rate and regular rhythm.  Pulmonary:     Breath sounds: Wheezing and rhonchi present.  Lymphadenopathy:     Cervical: Cervical adenopathy present.  Skin:    General: Skin is warm.     Capillary Refill: Capillary refill takes less than 2 seconds.  Neurological:     General: No focal deficit present.     Mental Status: He is alert and oriented to person, place, and time.      UC Treatments / Results  Labs (all labs ordered are listed, but only abnormal results are displayed) Labs Reviewed  POC COVID19/FLU A&B COMBO - Normal    EKG   Radiology No results found.  Procedures Procedures (including critical care time)  Medications Ordered in UC Medications  ipratropium-albuterol  (DUONEB) 0.5-2.5 (3) MG/3ML nebulizer solution 3 mL (3 mLs Nebulization Given 06/20/24 1046)  albuterol  (PROVENTIL ) (2.5 MG/3ML) 0.083% nebulizer solution 2.5 mg (2.5 mg Nebulization Given 06/20/24 1046)    Initial Impression / Assessment and Plan / UC Course  I have reviewed the triage vital signs and the nursing notes.  Pertinent labs & imaging results that were available during my care of the patient were reviewed by me and considered in my medical decision making (see chart for details).  Patient here with 1 day history of sinus pressure, cough with wheezing and has a history of bronchitis as he is a chronic daily smoker.  COVID flu test negative.  On evaluation patient is having some expiratory wheezing and decreased breath sounds.  Patient was treated here in clinic with the DuoNeb  and an albuterol  nebulizer treatment.  Work of breathing improved.  Saturation 100%. Will cover with azithromycin  more so prophylactically to cover for any evolving lower respiratory infection.  Prednisone  for an prove metaphoric of breathing and inflammation and lungs.  Promethazine  DM for cough.  Albuterol  inhaler 2 puffs every 4-6 hours as needed for shortness of breath and/or wheezing.  ER precautions given.  Patient also given information to follow up with PCP as needed. Final Clinical Impressions(s) / UC Diagnoses   Final diagnoses:  Acute bronchitis, unspecified organism  Acute tonsillitis, unspecified etiology     Discharge Instructions      COVID/Flu test is negative.  Take medications as prescribed. You have no fever at present however if you develop any fever take Tylenol . Symptoms should gradually improve over the next 5 to 7 days.  If at any point your breathing worsens and does not improve with albuterol  inhaler go to the nearest emergency department.     ED Prescriptions     Medication Sig Dispense Auth. Provider   predniSONE  (DELTASONE ) 20 MG tablet Take 1 tablet (20 mg total) by mouth 2 (two) times daily with a meal for 5 days. 10 tablet Arloa Suzen RAMAN, NP   promethazine -dextromethorphan (PROMETHAZINE -DM) 6.25-15 MG/5ML syrup Take 5 mLs by mouth 3 (three) times daily as needed. 180 mL Arloa Suzen RAMAN, NP   azithromycin  (ZITHROMAX ) 250 MG tablet Take 2 tabs PO x 1 dose, then 1 tab PO QD x 4 days 6 tablet Arloa Suzen RAMAN, NP   albuterol  (VENTOLIN  HFA) 108 (90 Base) MCG/ACT inhaler Inhale 2 puffs into the lungs every 4 (four) hours as needed for wheezing or shortness of breath. 8 g Arloa Suzen RAMAN, NP      PDMP not reviewed this encounter.   Arloa Suzen RAMAN, NP 06/20/24 1157

## 2024-06-20 NOTE — Discharge Instructions (Addendum)
 COVID/Flu test is negative.  Take medications as prescribed. You have no fever at present however if you develop any fever take Tylenol . Symptoms should gradually improve over the next 5 to 7 days.  If at any point your breathing worsens and does not improve with albuterol  inhaler go to the nearest emergency department.

## 2024-08-03 ENCOUNTER — Observation Stay (HOSPITAL_COMMUNITY)
Admission: EM | Admit: 2024-08-03 | Discharge: 2024-08-04 | Disposition: A | Discharge status: Home / Self Care | Attending: Internal Medicine | Admitting: Internal Medicine

## 2024-08-03 ENCOUNTER — Emergency Department (HOSPITAL_COMMUNITY)
Admission: EM | Admit: 2024-08-03 | Discharge: 2024-08-03 | Disposition: A | Discharge status: Home / Self Care | Source: Home / Self Care | Attending: Emergency Medicine | Admitting: Emergency Medicine

## 2024-08-03 ENCOUNTER — Encounter (HOSPITAL_COMMUNITY): Payer: Self-pay | Admitting: Family Medicine

## 2024-08-03 ENCOUNTER — Encounter (HOSPITAL_COMMUNITY): Payer: Self-pay | Admitting: Emergency Medicine

## 2024-08-03 ENCOUNTER — Other Ambulatory Visit: Payer: Self-pay

## 2024-08-03 DIAGNOSIS — R339 Retention of urine, unspecified: Secondary | ICD-10-CM | POA: Insufficient documentation

## 2024-08-03 DIAGNOSIS — Z720 Tobacco use: Secondary | ICD-10-CM | POA: Diagnosis present

## 2024-08-03 DIAGNOSIS — Z6832 Body mass index (BMI) 32.0-32.9, adult: Secondary | ICD-10-CM | POA: Diagnosis not present

## 2024-08-03 DIAGNOSIS — E669 Obesity, unspecified: Secondary | ICD-10-CM | POA: Diagnosis not present

## 2024-08-03 DIAGNOSIS — F17210 Nicotine dependence, cigarettes, uncomplicated: Secondary | ICD-10-CM | POA: Diagnosis not present

## 2024-08-03 DIAGNOSIS — T83091A Other mechanical complication of indwelling urethral catheter, initial encounter: Principal | ICD-10-CM | POA: Diagnosis present

## 2024-08-03 DIAGNOSIS — D6489 Other specified anemias: Secondary | ICD-10-CM | POA: Diagnosis not present

## 2024-08-03 DIAGNOSIS — Z79899 Other long term (current) drug therapy: Secondary | ICD-10-CM | POA: Insufficient documentation

## 2024-08-03 DIAGNOSIS — D649 Anemia, unspecified: Secondary | ICD-10-CM | POA: Insufficient documentation

## 2024-08-03 DIAGNOSIS — C61 Malignant neoplasm of prostate: Secondary | ICD-10-CM | POA: Diagnosis not present

## 2024-08-03 DIAGNOSIS — R310 Gross hematuria: Secondary | ICD-10-CM | POA: Diagnosis not present

## 2024-08-03 DIAGNOSIS — K219 Gastro-esophageal reflux disease without esophagitis: Secondary | ICD-10-CM | POA: Insufficient documentation

## 2024-08-03 DIAGNOSIS — F17200 Nicotine dependence, unspecified, uncomplicated: Secondary | ICD-10-CM | POA: Insufficient documentation

## 2024-08-03 DIAGNOSIS — Z8546 Personal history of malignant neoplasm of prostate: Secondary | ICD-10-CM | POA: Insufficient documentation

## 2024-08-03 DIAGNOSIS — I10 Essential (primary) hypertension: Secondary | ICD-10-CM | POA: Insufficient documentation

## 2024-08-03 DIAGNOSIS — R1030 Lower abdominal pain, unspecified: Secondary | ICD-10-CM | POA: Insufficient documentation

## 2024-08-03 DIAGNOSIS — Z7982 Long term (current) use of aspirin: Secondary | ICD-10-CM | POA: Diagnosis not present

## 2024-08-03 DIAGNOSIS — T839XXA Unspecified complication of genitourinary prosthetic device, implant and graft, initial encounter: Principal | ICD-10-CM | POA: Insufficient documentation

## 2024-08-03 DIAGNOSIS — R319 Hematuria, unspecified: Secondary | ICD-10-CM | POA: Diagnosis present

## 2024-08-03 LAB — CBC WITH DIFFERENTIAL/PLATELET
Abs Immature Granulocytes: 0.05 K/uL (ref 0.00–0.07)
Basophils Absolute: 0 K/uL (ref 0.0–0.1)
Basophils Relative: 0 %
Eosinophils Absolute: 0.1 K/uL (ref 0.0–0.5)
Eosinophils Relative: 0 %
HCT: 26.6 % — ABNORMAL LOW (ref 39.0–52.0)
Hemoglobin: 8.1 g/dL — ABNORMAL LOW (ref 13.0–17.0)
Immature Granulocytes: 0 %
Lymphocytes Relative: 9 %
Lymphs Abs: 1.1 K/uL (ref 0.7–4.0)
MCH: 22.6 pg — ABNORMAL LOW (ref 26.0–34.0)
MCHC: 30.5 g/dL (ref 30.0–36.0)
MCV: 74.1 fL — ABNORMAL LOW (ref 80.0–100.0)
Monocytes Absolute: 1 K/uL (ref 0.1–1.0)
Monocytes Relative: 8 %
Neutro Abs: 10.1 K/uL — ABNORMAL HIGH (ref 1.7–7.7)
Neutrophils Relative %: 83 %
Platelets: 299 K/uL (ref 150–400)
RBC: 3.59 MIL/uL — ABNORMAL LOW (ref 4.22–5.81)
RDW: 21.1 % — ABNORMAL HIGH (ref 11.5–15.5)
WBC: 12.3 K/uL — ABNORMAL HIGH (ref 4.0–10.5)
nRBC: 0 % (ref 0.0–0.2)

## 2024-08-03 LAB — FERRITIN: Ferritin: 8 ng/mL — ABNORMAL LOW (ref 24–336)

## 2024-08-03 LAB — URINALYSIS, ROUTINE W REFLEX MICROSCOPIC
Bacteria, UA: NONE SEEN
RBC / HPF: >50 RBC/hpf (ref 0–5)
WBC, UA: >50 WBC/hpf (ref 0–5)

## 2024-08-03 LAB — IRON AND TIBC
Iron: 46 ug/dL (ref 45–182)
Saturation Ratios: 11 % — ABNORMAL LOW (ref 17.9–39.5)
TIBC: 437 ug/dL (ref 250–450)
UIBC: 391 ug/dL

## 2024-08-03 LAB — COMPREHENSIVE METABOLIC PANEL WITH GFR
ALT: 38 U/L (ref 0–44)
AST: 44 U/L — ABNORMAL HIGH (ref 15–41)
Albumin: 4.5 g/dL (ref 3.5–5.0)
Alkaline Phosphatase: 100 U/L (ref 38–126)
Anion gap: 13 (ref 5–15)
BUN: 18 mg/dL (ref 8–23)
CO2: 21 mmol/L — ABNORMAL LOW (ref 22–32)
Calcium: 9.6 mg/dL (ref 8.9–10.3)
Chloride: 98 mmol/L (ref 98–111)
Creatinine, Ser: 1.16 mg/dL (ref 0.61–1.24)
GFR, Estimated: >60 mL/min (ref >60)
Glucose, Bld: 107 mg/dL — ABNORMAL HIGH (ref 70–99)
Potassium: 3.9 mmol/L (ref 3.5–5.1)
Sodium: 132 mmol/L — ABNORMAL LOW (ref 135–145)
Total Bilirubin: 0.7 mg/dL (ref 0.0–1.2)
Total Protein: 6.9 g/dL (ref 6.5–8.1)

## 2024-08-03 LAB — CBC
HCT: 27.2 % — ABNORMAL LOW (ref 39.0–52.0)
Hemoglobin: 8.1 g/dL — ABNORMAL LOW (ref 13.0–17.0)
MCH: 22.4 pg — ABNORMAL LOW (ref 26.0–34.0)
MCHC: 29.8 g/dL — ABNORMAL LOW (ref 30.0–36.0)
MCV: 75.1 fL — ABNORMAL LOW (ref 80.0–100.0)
Platelets: 298 K/uL (ref 150–400)
RBC: 3.62 MIL/uL — ABNORMAL LOW (ref 4.22–5.81)
RDW: 21.1 % — ABNORMAL HIGH (ref 11.5–15.5)
WBC: 11.3 K/uL — ABNORMAL HIGH (ref 4.0–10.5)
nRBC: 0.2 % (ref 0.0–0.2)

## 2024-08-03 LAB — PROTIME-INR
INR: 1.1 (ref 0.8–1.2)
Prothrombin Time: 15.1 s (ref 11.4–15.2)

## 2024-08-03 MED ORDER — FLUTICASONE PROPIONATE 50 MCG/ACT NA SUSP
1.0000 | Freq: Every day | NASAL | Status: DC
  Administered 2024-08-04: 1 via NASAL
  Filled 2024-08-03: qty 16

## 2024-08-03 MED ORDER — ACETAMINOPHEN 650 MG RE SUPP: 650.0000 mg | Freq: Four times a day (QID) | RECTAL | Status: DC | PRN

## 2024-08-03 MED ORDER — ACETAMINOPHEN 325 MG PO TABS: 650.0000 mg | ORAL_TABLET | Freq: Four times a day (QID) | ORAL | Status: DC | PRN

## 2024-08-03 MED ORDER — HYDROCODONE-ACETAMINOPHEN 5-325 MG PO TABS: 1.0000 | ORAL_TABLET | ORAL | Status: DC | PRN

## 2024-08-03 MED ORDER — AMLODIPINE BESYLATE 10 MG PO TABS
10.0000 mg | ORAL_TABLET | Freq: Every day | ORAL | Status: DC
  Administered 2024-08-03 – 2024-08-04 (×2): 10 mg via ORAL
  Filled 2024-08-03 (×2): qty 1

## 2024-08-03 MED ORDER — METOPROLOL SUCCINATE ER 50 MG PO TB24
100.0000 mg | ORAL_TABLET | Freq: Every day | ORAL | Status: DC
  Administered 2024-08-04: 100 mg via ORAL
  Filled 2024-08-03: qty 2

## 2024-08-03 MED ORDER — ONDANSETRON HCL 4 MG/2ML IJ SOLN: 4.0000 mg | Freq: Four times a day (QID) | INTRAMUSCULAR | Status: DC | PRN

## 2024-08-03 MED ORDER — LORATADINE 10 MG PO TABS
10.0000 mg | ORAL_TABLET | Freq: Every day | ORAL | Status: DC
  Administered 2024-08-03 – 2024-08-04 (×2): 10 mg via ORAL
  Filled 2024-08-03 (×2): qty 1

## 2024-08-03 MED ORDER — FERROUS SULFATE 325 (65 FE) MG PO TABS
325.0000 mg | ORAL_TABLET | Freq: Every day | ORAL | Status: DC
  Administered 2024-08-04: 325 mg via ORAL
  Filled 2024-08-03: qty 1

## 2024-08-03 MED ORDER — SODIUM CHLORIDE 0.9 % IR SOLN
3000.0000 mL | Status: DC
  Administered 2024-08-03 – 2024-08-04 (×11): 3000 mL

## 2024-08-03 MED ORDER — ALBUTEROL SULFATE (2.5 MG/3ML) 0.083% IN NEBU: 2.5000 mg | INHALATION_SOLUTION | RESPIRATORY_TRACT | Status: DC | PRN

## 2024-08-03 MED ORDER — METOPROLOL TARTRATE 5 MG/5ML IV SOLN: 5.0000 mg | Freq: Four times a day (QID) | INTRAVENOUS | Status: DC | PRN

## 2024-08-03 MED ORDER — BENAZEPRIL HCL 20 MG PO TABS
40.0000 mg | ORAL_TABLET | Freq: Every day | ORAL | Status: DC
  Administered 2024-08-03 – 2024-08-04 (×2): 40 mg via ORAL
  Filled 2024-08-03 (×2): qty 2

## 2024-08-03 MED ORDER — ONDANSETRON HCL 4 MG PO TABS: 4.0000 mg | ORAL_TABLET | Freq: Four times a day (QID) | ORAL | Status: DC | PRN

## 2024-08-03 MED ORDER — NICOTINE 14 MG/24HR TD PT24
14.0000 mg | MEDICATED_PATCH | Freq: Every day | TRANSDERMAL | Status: DC
  Administered 2024-08-03 – 2024-08-04 (×2): 14 mg via TRANSDERMAL
  Filled 2024-08-03 (×2): qty 1

## 2024-08-03 NOTE — ED Triage Notes (Signed)
 Patient to ED by POV from home with c/o clogged catheter. He states he was seen this morning and had catheter placed. He has not urinated went home and tried to irrigate catheter himself with no relief.

## 2024-08-03 NOTE — Assessment & Plan Note (Signed)
 Nicotine patch  Encourage cessation

## 2024-08-03 NOTE — ED Triage Notes (Signed)
 63 y/o male comes in c/o urinary retention reporting he has not urinated since 7pm last evening. Pt reports a hx of retention reporting passing some clots at times.

## 2024-08-03 NOTE — ED Provider Notes (Signed)
  EMERGENCY DEPARTMENT AT [facility] Provider Note   CSN: [id number] Arrival date & time: 08/03/24  1337     History Chief Complaint  Patient presents with   Catheter clogged    HPI: Erik Gill is a 63 y.o. male with history pertinent for prostate cancer, hematuria, HTN who presents complaining of catheter dysfunction. Patient arrived via POV.  History provided by patient.  No interpreter required during this encounter.  Patient reports that he has a history of recurrent hematuria that he has had multiple prior interventions for under the care of of his urology provider.  Reports that he previously has been resistant to Foley catheter placement, however reports that yesterday he developed acute urinary retention after passage of clots at home, and came to the emergency department and had a three-way hematuria catheter placed.  Reports that he was discharged home and initially this was functioning well, however his catheter became clogged at home, and his attempts to irrigate at home were unsuccessful.  Reports that he has sensation of urinary retention and lower abdominal pain at this time.  Patient's recorded medical, surgical, social, medication list and allergies were reviewed in the Snapshot window as part of the initial history.   Prior to Admission medications   Medication Sig Start Date End Date Taking? Authorizing Provider  acetaminophen  (TYLENOL ) 650 MG CR tablet Take 1,300 mg by mouth every 8 (eight) hours as needed for pain.   Yes [provider]  albuterol  (VENTOLIN  HFA) 108 (90 Base) MCG/ACT inhaler Inhale 2 puffs into the lungs every 4 (four) hours as needed for wheezing or shortness of breath. 06/20/24  Yes Arloa Suzen RAMAN, NP  amLODipine -benazepril  (LOTREL) 10-40 MG capsule Take 1 capsule by mouth in the morning. 09/28/16  Yes [provider]  cetirizine (ZYRTEC ALLERGY) 10 MG tablet Take 10 mg by mouth in the morning.   Yes  [provider]  clotrimazole -betamethasone  (LOTRISONE ) cream Apply to affected area 2 times daily prn 08/22/23  Yes Billy Asberry FALCON, PA-C  ferrous sulfate  325 (65 FE) MG tablet Take 1 tablet (325 mg total) by mouth daily. 04/11/24 08/03/24 Yes Trifan, Donnice PARAS, MD  fluticasone  (FLONASE ) 50 MCG/ACT nasal spray Place 1 spray into both nostrils daily for 3 days. Patient taking differently: Place 1 spray into both nostrils in the morning. 05/19/22 08/03/24 Yes Mound, Darryle BRAVO, FNP  metoprolol  succinate (TOPROL -XL) 100 MG 24 hr tablet Take 100 mg by mouth in the morning. 08/21/22  Yes [provider]  Naphazoline-Pheniramine (OPCON-A ) 0.027-0.315 % SOLN Place 1 drop into both eyes daily as needed (allergies).   Yes [provider]  pantoprazole (PROTONIX) 40 MG tablet Take 40 mg by mouth as needed. 04/21/24  Yes [provider]  tadalafil (CIALIS) 20 MG tablet Take 20 mg by mouth daily as needed for erectile dysfunction. 09/27/15  Yes [provider]     Allergies: Other   Review of Systems   ROS as per HPI  Physical Exam Updated Vital Signs BP 133/69   Pulse 67   Temp 98.4 F (36.9 C)   Resp 16   Ht 5' 6 (1.676 m)   Wt 90.5 kg   SpO2 100%   BMI 32.20 kg/m  Physical Exam Vitals and nursing note reviewed.  Constitutional:      General: He is not in acute distress.    Appearance: He is well-developed.  HENT:     Head: Normocephalic and atraumatic.  Eyes:  Conjunctiva/sclera: Conjunctivae normal.  Cardiovascular:     Rate and Rhythm: Normal rate and regular rhythm.     Heart sounds: No murmur heard. Pulmonary:     Effort: Pulmonary effort is normal. No respiratory distress.     Breath sounds: Normal breath sounds.  Abdominal:     Palpations: Abdomen is soft.     Tenderness: There is abdominal tenderness (Suprapubic).  Genitourinary:    Comments: 17 French three-way Foley catheter in place Musculoskeletal:        General: No swelling.      Cervical back: Neck supple.  Skin:    General: Skin is warm and dry.     Capillary Refill: Capillary refill takes less than 2 seconds.  Neurological:     Mental Status: He is alert.  Psychiatric:        Mood and Affect: Mood normal.     ED Course/ Medical Decision Making/ A&P    Procedures Ultrasound ED Abd  Date/Time: 08/05/2024 2:57 AM  Performed by: Rogelia Jerilynn RAMAN, MD Authorized by: Rogelia Jerilynn RAMAN, MD   Procedure details:    Indications: decreased urinary output and hematuria     Scope of abdominal ultrasound: Urinary retention and clot.   Bladder:  Visualized    Images: archived    Bladder findings:    Free pelvic fluid: not identified     Volume:  350cc Comments:     Longitudinal and transverse views of the bladder were obtained as well as calculations.  Foley catheter and fluid visualized within the bladder.  Total bladder volume of approximately 350 cc, patient with hyperechoic material layering in the posterior aspect of the bladder consistent with clot, total volume of clot burden approximately 50 cc    Medications Ordered in ED Medications  0.9 %  sodium chloride  infusion (Manually program via Guardrails IV Fluids) (0 mLs Intravenous Stopped 08/04/24 1817)  iohexol  (OMNIPAQUE ) 300 MG/ML solution 100 mL (100 mLs Intravenous Contrast Given 08/04/24 1425)    Medical Decision Making:   [name] is a 63 y.o. male who presents for catheter dysfunction as per above.  Physical exam is pertinent for suprapubic tenderness.   The differential includes but is not limited to urinary retention, obstructive uropathy, AKI, electrolyte derangement, blood loss anemia.  Independent historian: None  External data reviewed: Notes reviewed prior ED notes where catheter was placed  Labs: Ordered, Independent interpretation, and Details: CMP without AKI, emergent electrolyte changes, emergent LFT abnormality.  CBC with slight nonspecific leukocytosis with left shift.   Anemia at baseline.  No thrombocytopenia.  Radiology: Not indicated CT HEMATURIA WORKUP Result Date: 08/04/2024 CLINICAL DATA:  Hematuria.  History of prostate cancer. EXAM: CT ABDOMEN AND PELVIS WITHOUT AND WITH CONTRAST TECHNIQUE: Multidetector CT imaging of the abdomen and pelvis was performed following the standard protocol before and following the bolus administration of intravenous contrast. RADIATION DOSE REDUCTION: This exam was performed according to the departmental dose-optimization program which includes automated exposure control, adjustment of the mA and/or kV according to patient size and/or use of iterative reconstruction technique. CONTRAST:  OMNIPAQUE  IOHEXOL  300 MG/ML  SOLN COMPARISON:  03/28/2024. FINDINGS: Lower chest: Atelectasis is noted at the lung bases. Coronary artery calcification is seen. Hepatobiliary: There is a subcentimeter hypodensity in the right lobe of the liver which is too small to further characterize. No biliary ductal dilatation. The gallbladder is without stones. Pancreas: Unremarkable. No pancreatic ductal dilatation or surrounding inflammatory changes. Spleen: Normal in size without focal abnormality.  Adrenals/Urinary Tract: The adrenal glands are within normal limits. No renal or ureteral calculus bilaterally. No hydronephrosis on the right. Mild hydroureteronephrosis is present on the left with perinephric and periureteral fat stranding on the left. No filling defect is seen in the ureters on delayed imaging. A Foley catheter is noted on the left. There is diffuse bladder wall thickening which may in part be due to under distension. There is a small filling defect in the posterior aspect of the urinary bladder, best seen on delayed sagittal image 72. Stomach/Bowel: The stomach is within normal limits. No bowel obstruction, free air, or pneumatosis is seen. Appendix appears normal. Vascular/Lymphatic: Aortic atherosclerosis. No enlarged abdominal or pelvic lymph  nodes. Reproductive: Prostate gland is not seen. Other: No abdominopelvic ascites. Multifocal fat containing anterior abdominal wall hernias are noted in the upper abdomen. Musculoskeletal: Degenerative changes are present in the thoracolumbar spine. No acute osseous abnormality is seen. IMPRESSION: 1. Small filling defect in the posterior aspect of the urinary bladder on delayed imaging, possibly representing debris or blood products given history of hematuria. The possibility of underlying nodule cannot be completely excluded. Cystoscopy may be beneficial for further evaluation. 2. Mild hydroureteronephrosis on the left with perinephric and periureteral fat stranding with no evidence of renal calculus. Bladder wall thickening is also noted. Findings may be infectious or inflammatory. 3. Aortic atherosclerosis and coronary artery calcification. Electronically Signed   By: Leita Birmingham M.D.   On: 08/04/2024 17:58    EKG/Medicine tests: Not indicated EKG Interpretation:                  Interventions: Saline for CBI  See the EMR for full details regarding lab and imaging results.  Patient presents for Foley catheter dysfunction and urinary retention.  On my evaluation patient does have gross hematuria within his catheter bag without active drainage from the Foley catheter.  Chronic care ultrasound performed with evidence of urinary retention and significant clot burden despite appropriate placement of the Foley catheter.  Labs ordered, and Foley catheter irrigated by nursing with return of large clots and subsequent flow of hematuria.  Patient feels significant relief thereafter.  Given patient had blockage of catheter despite placement of large bore three-way catheter and self irrigation at home, do feel that patient warrants consideration for continuous bladder irrigation.  Urology consulted, and I discussed the patient's case with Dr. Sherrilee who felt the patient was appropriate for admission to  hospitalist with continuous bladder irrigation.  Patient amenable.  Medicine consulted, and patient was accepted to hospitalist by Dr. Waddell.  Presentation is most consistent with acute complicated illness and Current presentation is complicated by underlying chronic conditions  Discussion of management or test interpretations with external provider(s): Urology, Dr. Sherrilee, hospitalist, Dr. Waddell  Risk Drugs:Prescription drug management Treatment: Decision regarding hospitalization  Disposition: ADMIT: I believe the patient requires admission for further care and management. The patient was admitted to hospitalist with urology consult. Please see inpatient provider note for additional treatment plan details.   MDM generated using voice dictation software and may contain dictation errors.  Please contact me for any clarification or with any questions.  Clinical Impression:  1. Gross hematuria   2. Problem with Foley catheter, initial encounter [facility])      Admit   Final Clinical Impression(s) / ED Diagnoses Final diagnoses:  Gross hematuria  Problem with Foley catheter, initial encounter [facility])    Rx / DC Orders ED Discharge Orders  Ordered    Increase activity slowly        08/04/24 1814    Diet - low sodium heart healthy        08/04/24 1814    Call MD for:  temperature >100.4        08/04/24 1814    Call MD for:  persistant nausea and vomiting        08/04/24 1814    Call MD for:  severe uncontrolled pain        08/04/24 1814    Call MD for:  persistant dizziness or light-headedness        08/04/24 1814    Call MD for:  extreme fatigue        08/04/24 1814    Call MD for:  difficulty breathing, headache or visual disturbances        08/04/24 1814    Discharge instructions       Comments: Follow instructions provided by Urology.  You were cared for by a hospitalist during your hospital stay. If you have any questions about your discharge medications or the  care you received while you were in the hospital after you are discharged, you can call the unit and asked to speak with the hospitalist on call if the hospitalist that took care of you is not available. Once you are discharged, your primary care physician will handle any further medical issues. Please note that NO REFILLS for any discharge medications will be authorized once you are discharged, as it is imperative that you return to your primary care physician (or establish a relationship with a primary care physician if you do not have one) for your aftercare needs so that they can reassess your need for medications and monitor your lab values. If you do not have a primary care physician, you can call [phone] for a physician referral.   08/04/24 1814             Rogelia Jerilynn RAMAN, MD 08/05/24 [phone]

## 2024-08-03 NOTE — Discharge Instructions (Addendum)
 You were seen this morning for urinary retention.  If you develop difficulty urinating through the catheter please return immediately to the emergency department.  Follow-up otherwise with your urologist for further evaluation and removal of the catheter.

## 2024-08-03 NOTE — Assessment & Plan Note (Signed)
 Continue norvasc  10mg , benzapril 40mg  and toprol -XL100mg  daily PRN lopressor  for elevated BP

## 2024-08-03 NOTE — ED Provider Notes (Signed)
 [place] EMERGENCY DEPARTMENT AT [facility] Provider Note   CSN: [id number] Arrival date & time: 08/03/24  9478     Patient presents with: Urinary Retention   Erik Gill is a 63 y.o. male.  Patient with past medical history significant for radical prostatectomy in 2022, prostate cancer status post radiation approximately 3 years ago, hypertension presents to the emergency department complaining of urinary retention.  He states he has recently had some issues with blood clots in his urine.  He reports his urologist tells him this is likely a complication of the radiation from years ago.  He was last able to urinate at around 10 PM and states he feels he really needs to urinate.  He has been drinking lots of fluid.  He denies nausea, vomiting, fever.   HPI     Prior to Admission medications   Medication Sig Start Date End Date Taking? Authorizing Provider  albuterol  (VENTOLIN  HFA) 108 (90 Base) MCG/ACT inhaler Inhale 2 puffs into the lungs every 4 (four) hours as needed for wheezing or shortness of breath. 06/20/24   Arloa Suzen RAMAN, NP  amLODipine -benazepril  (LOTREL) 10-40 MG capsule Take 1 capsule by mouth daily. 09/28/16   [provider]  amoxicillin -clavulanate (AUGMENTIN ) 875-125 MG tablet Take 1 tablet by mouth every 12 (twelve) hours. 12/05/23   Billy Asberry FALCON, PA-C  ASPIRIN PO Take 81 mg by mouth.    [provider]  azithromycin  (ZITHROMAX ) 250 MG tablet Take 2 tabs PO x 1 dose, then 1 tab PO QD x 4 days 06/20/24   Arloa Suzen RAMAN, NP  benzonatate  (TESSALON ) 100 MG capsule Take 1 capsule (100 mg total) by mouth every 8 (eight) hours. Patient not taking: Reported on 08/01/2022 04/12/22   Raspet, Erin K, PA-C  benzonatate  (TESSALON ) 200 MG capsule Take 100 mg by mouth 3 (three) times daily as needed. 04/21/24   [provider]  cetirizine (ZYRTEC ALLERGY) 10 MG tablet Take 10 mg by mouth daily.    [provider]  cetirizine  (ZYRTEC) 10 MG tablet Take 10 mg by mouth daily.    [provider]  clotrimazole -betamethasone  (LOTRISONE ) cream Apply to affected area 2 times daily prn 08/22/23   Billy Asberry FALCON, PA-C  Dextromethorphan-guaiFENesin  (MUCINEX  DM MAXIMUM STRENGTH PO) Take 1,200 mg by mouth 2 (two) times daily.    [provider]  docusate sodium  (COLACE) 100 MG capsule Take 1 capsule (100 mg total) by mouth 2 (two) times daily. Patient not taking: Reported on 02/28/2022 11/14/21   Cory Palma, PA-C  ferrous sulfate  325 (65 FE) MG tablet Take 1 tablet (325 mg total) by mouth daily. 04/11/24 06/10/24  Cottie Donnice PARAS, MD  fluticasone  (FLONASE ) 50 MCG/ACT nasal spray Place 1 spray into both nostrils daily for 3 days. 05/19/22 09/14/22  Hazen Darryle BRAVO, FNP  fluticasone  (FLONASE ) 50 MCG/ACT nasal spray Place 2 sprays into both nostrils daily.    [provider]  guaiFENesin  200 MG tablet Take 1 tablet (200 mg total) by mouth every 4 (four) hours as needed for cough or to loosen phlegm. Patient not taking: Reported on 09/14/2022 05/19/22   Hazen Darryle BRAVO, FNP  Iron, Ferrous Sulfate , 325 (65 Fe) MG TABS 1 tablet Orally once a day 04/21/24   [provider]  meloxicam  (MOBIC ) 15 MG tablet Take 15 mg by mouth daily.    [provider]  metoprolol  succinate (TOPROL -XL) 100 MG 24 hr tablet Take 100 mg by mouth daily. 08/21/22  [provider]  Naphazoline-Pheniramine (OPCON-A ) 0.027-0.315 % SOLN Place 1 drop into both eyes daily as needed (allergies).    [provider]  Naphazoline-Pheniramine (OPCON-A ) 0.027-0.315 % SOLN 1 drop into affected eye  as needed Ophthalmic Four times a day    [provider]  pantoprazole (PROTONIX) 40 MG tablet Take 40 mg by mouth daily. 04/21/24   [provider]  promethazine -dextromethorphan (PROMETHAZINE -DM) 6.25-15 MG/5ML syrup Take 5 mLs by mouth 3 (three) times daily as needed. 06/20/24   Arloa Suzen RAMAN, NP   tadalafil (CIALIS) 20 MG tablet Take 20 mg by mouth daily as needed for erectile dysfunction. 09/27/15   [provider]  triamcinolone  cream (KENALOG ) 0.1 % Apply 1 Application topically 2 (two) times daily.    [provider]  UNKNOWN TO PATIENT Suppliment, PRN.    [provider]    Allergies: Patient has no known allergies.    Review of Systems  Updated Vital Signs BP (!) 189/96 (BP Location: Right Arm)   Pulse 94   Temp 98.1 F (36.7 C) (Oral)   Resp 16   Ht 5' 6 (1.676 m)   Wt 93 kg   SpO2 97%   BMI 33.09 kg/m   Physical Exam Vitals and nursing note reviewed.  HENT:     Head: Normocephalic and atraumatic.  Eyes:     Conjunctiva/sclera: Conjunctivae normal.  Cardiovascular:     Rate and Rhythm: Normal rate.  Pulmonary:     Effort: Pulmonary effort is normal. No respiratory distress.  Abdominal:     Tenderness: There is abdominal tenderness.     Comments: Suprapubic tenderness  Musculoskeletal:        General: No signs of injury.     Cervical back: Normal range of motion.  Skin:    General: Skin is dry.  Neurological:     Mental Status: He is alert.  Psychiatric:        Speech: Speech normal.        Behavior: Behavior normal.     (all labs ordered are listed, but only abnormal results are displayed) Labs Reviewed  URINALYSIS, ROUTINE W REFLEX MICROSCOPIC - Abnormal; Notable for the following components:      Result Value   Color, Urine RED (*)    APPearance TURBID (*)    Glucose, UA   (*)    Value: TEST NOT REPORTED DUE TO COLOR INTERFERENCE OF URINE PIGMENT   Hgb urine dipstick   (*)    Value: TEST NOT REPORTED DUE TO COLOR INTERFERENCE OF URINE PIGMENT   Bilirubin Urine   (*)    Value: TEST NOT REPORTED DUE TO COLOR INTERFERENCE OF URINE PIGMENT   Ketones, ur   (*)    Value: TEST NOT REPORTED DUE TO COLOR INTERFERENCE OF URINE PIGMENT   Protein, ur   (*)    Value: TEST NOT REPORTED DUE TO COLOR INTERFERENCE OF URINE  PIGMENT   Nitrite   (*)    Value: TEST NOT REPORTED DUE TO COLOR INTERFERENCE OF URINE PIGMENT   Leukocytes,Ua   (*)    Value: TEST NOT REPORTED DUE TO COLOR INTERFERENCE OF URINE PIGMENT   All other components within normal limits    EKG: None  Radiology: No results found.   Procedures   Medications Ordered in the ED - No data to display  Medical Decision Making Amount and/or Complexity of Data Reviewed Labs: ordered.   This patient presents to the ED for concern of urinary retention, this involves an extensive number of treatment options, and is a complaint that carries with it a high risk of complications and morbidity.  The differential diagnosis includes bladder outlet obstruction, infection, stricture, cauda equina, others   Co morbidities / Chronic conditions that complicate the patient evaluation  Status post radiation and prostatectomy   Additional history obtained:  Additional history obtained from EMR   Lab Tests:  I Ordered, and personally interpreted labs.  The pertinent results include:  Turbid UA   Social Determinants of Health:  Patient is a daily tobacco smoker   Test / Admission - Considered:  Patient had a three-way Foley placed with anticipation of need for irrigation based on history.  Upon placing the Foley approximately 500 mL of turbid/bloody urine returned.  Patient had immediate relief in symptoms.  No back pain, saddle anesthesia to suggest cauda equina or nerve injury.  Plan to discharge home with recommendations for follow-up with urology as an outpatient.  Leg bag placed.  Patient provided return precautions including inability to urinate even with the Foley.  Patient voices understanding with plan.      Final diagnoses:  Urinary retention    ED Discharge Orders     None          Hayk, Divis 08/03/24 9372    Bari Charmaine FALCON, MD 08/03/24 [phone]

## 2024-08-03 NOTE — H&P (Signed)
 History and Physical    Patient: Erik Gill FMW:[id number] DOB: [birth year] DOA: 08/03/2024 DOS: the patient was seen and examined on 08/03/2024 PCP: Arloa Elsie SAUNDERS, MD  Patient coming from: Home - lives alone.    Chief Complaint: foley catheter clogged   HPI: Erik Gill is a 63 y.o. male with medical history significant of prostate cancer s/p prostatectomy in 2023 and salvage radiation therapy, HTN, HLD, tobacco abuse who presented to ED with complaints of clogged foley catheter. He has hx of recurrent bladder bleeding and clots. He tells me yesterday before work he was able to push out a clot in his urine. He tried to drink water . This early morning he was able to void and came to ED and a foley catheter was placed. He went home and his foley stopped working and had no luck with irrigation so came to ED. He is on ASA only.   He states he has had to have a catheter once before with irrigation. This is his second time for one.   Denies any fever/chills, vision changes/headaches, chest pain or palpitations, shortness of breath or cough, abdominal pain, N/V/D, leg swelling.   He does smoke, 1 pack every 2 days. No alcohol.   ER Course:  vitals: afebrile, bp: 189/96, HR: 94, RR: 16, oxygen : 97%RA Pertinent labs: WBC: 12.3, hgb: 8.1,  Foley irrigated, urology consulted. [facility] asked to admit.    Review of Systems: As mentioned in the history of present illness. All other systems reviewed and are negative. Past Medical History:  Diagnosis Date   Elevated PSA    GERD (gastroesophageal reflux disease)    Headache    sinus related   Hypertension    Nocturia    Prostate cancer (HCC)    Wears glasses    Past Surgical History:  Procedure Laterality Date   HERNIA REPAIR Right    LYMPHADENECTOMY Bilateral 11/14/2021   Procedure: LYMPHADENECTOMY, PELVIC;  Surgeon: Renda Glance, MD;  Location: [facility];  Service: Urology;  Laterality: Bilateral;   PROSTATE BIOPSY N/A 08/29/2017    Procedure: BIOPSY TRANSRECTAL ULTRASONIC PROSTATE (TUBP);  Surgeon: Devere Lonni Righter, MD;  Location: [facility];  Service: Urology;  Laterality: N/A;   ROBOT ASSISTED LAPAROSCOPIC RADICAL PROSTATECTOMY N/A 11/14/2021   Procedure: XI ROBOTIC ASSISTED LAPAROSCOPIC RADICAL PROSTATECTOMY LEVEL 1;  Surgeon: Renda Glance, MD;  Location: [facility];  Service: Urology;  Laterality: N/A;   Social History:  reports that he has been smoking cigarettes. He has a 0.5 pack-year smoking history. He has never used smokeless tobacco. He reports that he does not currently use alcohol. He reports that he does not use drugs.  Allergies  Allergen Reactions   Other     Seasonal Allergies that are year round    Family History  Problem Relation Age of Onset   Cancer Father        colon cancer   Cancer Brother        prostate   Cancer Brother        prostate   Cancer Cousin        prostate cancer/maternal first cousin    Prior to Admission medications   Medication Sig Start Date End Date Taking? Authorizing Provider  albuterol  (VENTOLIN  HFA) 108 (90 Base) MCG/ACT inhaler Inhale 2 puffs into the lungs every 4 (four) hours as needed for wheezing or shortness of breath. 06/20/24   Arloa Suzen RAMAN, NP  amLODipine -benazepril  (LOTREL) 10-40 MG capsule Take 1 capsule by mouth  daily. 09/28/16   [provider]  amoxicillin -clavulanate (AUGMENTIN ) 875-125 MG tablet Take 1 tablet by mouth every 12 (twelve) hours. 12/05/23   Billy Asberry FALCON, PA-C  ASPIRIN PO Take 81 mg by mouth.    [provider]  azithromycin  (ZITHROMAX ) 250 MG tablet Take 2 tabs PO x 1 dose, then 1 tab PO QD x 4 days 06/20/24   Arloa Suzen RAMAN, NP  benzonatate  (TESSALON ) 100 MG capsule Take 1 capsule (100 mg total) by mouth every 8 (eight) hours. Patient not taking: Reported on 08/01/2022 04/12/22   Raspet, Erin K, PA-C  benzonatate  (TESSALON ) 200 MG capsule Take 100 mg by mouth 3 (three) times daily as  needed. 04/21/24   [provider]  cetirizine (ZYRTEC ALLERGY) 10 MG tablet Take 10 mg by mouth daily.    [provider]  cetirizine (ZYRTEC) 10 MG tablet Take 10 mg by mouth daily.    [provider]  clotrimazole -betamethasone  (LOTRISONE ) cream Apply to affected area 2 times daily prn 08/22/23   Billy Asberry FALCON, PA-C  Dextromethorphan-guaiFENesin  (MUCINEX  DM MAXIMUM STRENGTH PO) Take 1,200 mg by mouth 2 (two) times daily.    [provider]  docusate sodium  (COLACE) 100 MG capsule Take 1 capsule (100 mg total) by mouth 2 (two) times daily. Patient not taking: Reported on 02/28/2022 11/14/21   Cory Palma, PA-C  ferrous sulfate  325 (65 FE) MG tablet Take 1 tablet (325 mg total) by mouth daily. 04/11/24 06/10/24  Cottie Donnice PARAS, MD  fluticasone  (FLONASE ) 50 MCG/ACT nasal spray Place 1 spray into both nostrils daily for 3 days. 05/19/22 09/14/22  Hazen Darryle BRAVO, FNP  fluticasone  (FLONASE ) 50 MCG/ACT nasal spray Place 2 sprays into both nostrils daily.    [provider]  guaiFENesin  200 MG tablet Take 1 tablet (200 mg total) by mouth every 4 (four) hours as needed for cough or to loosen phlegm. Patient not taking: Reported on 09/14/2022 05/19/22   Hazen Darryle BRAVO, FNP  Iron, Ferrous Sulfate , 325 (65 Fe) MG TABS 1 tablet Orally once a day 04/21/24   [provider]  meloxicam  (MOBIC ) 15 MG tablet Take 15 mg by mouth daily.    [provider]  metoprolol  succinate (TOPROL -XL) 100 MG 24 hr tablet Take 100 mg by mouth daily. 08/21/22   [provider]  Naphazoline-Pheniramine (OPCON-A ) 0.027-0.315 % SOLN Place 1 drop into both eyes daily as needed (allergies).    [provider]  Naphazoline-Pheniramine (OPCON-A ) 0.027-0.315 % SOLN 1 drop into affected eye  as needed Ophthalmic Four times a day    [provider]  pantoprazole (PROTONIX) 40 MG tablet Take 40 mg by mouth daily. 04/21/24   [provider]   promethazine -dextromethorphan (PROMETHAZINE -DM) 6.25-15 MG/5ML syrup Take 5 mLs by mouth 3 (three) times daily as needed. 06/20/24   Arloa Suzen RAMAN, NP  tadalafil (CIALIS) 20 MG tablet Take 20 mg by mouth daily as needed for erectile dysfunction. 09/27/15   [provider]  triamcinolone  cream (KENALOG ) 0.1 % Apply 1 Application topically 2 (two) times daily.    [provider]  UNKNOWN TO PATIENT Suppliment, PRN.    [provider]    Physical Exam: Vitals:   08/03/24 1827 08/03/24 1830 08/03/24 1845 08/03/24 1921  BP: 137/62 (!) 108/95 130/75 (!) 174/91  Pulse: 85 87 87 91  Resp: 18   18  Temp: 98 F (36.7 C)   99 F (37.2 C)  TempSrc: Oral   Oral  SpO2: 99% 100% 99% 100%  Weight:    90.5 kg  Height:       General:  Appears calm and comfortable and is in NAD Eyes:  PERRL, EOMI, normal lids, iris ENT:  grossly normal hearing, lips & tongue, mmm; appropriate dentition Neck:  no LAD, masses or thyromegaly; no carotid bruits Cardiovascular:  RRR, no m/r/g. No LE edema.  Respiratory:   CTA bilaterally with no wheezes/rales/rhonchi.  Normal respiratory effort. Abdomen:  soft, NT, ND, NABS Back:   normal alignment, no CVAT Skin:  no rash or induration seen on limited exam Musculoskeletal:  grossly normal tone BUE/BLE, good ROM, no bony abnormality Lower extremity:  No LE edema.  Limited foot exam with no ulcerations.  2+ distal pulses. Psychiatric:  grossly normal mood and affect, speech fluent and appropriate, AOx3 Neurologic:  CN 2-12 grossly intact, moves all extremities in coordinated fashion, sensation intact   Radiological Exams on Admission: Independently reviewed - see discussion in A/P where applicable  No results found.   Labs on Admission: I have personally reviewed the available labs and imaging studies at the time of the admission.  Pertinent labs:   WBC: 12.3,  hgb: 8.1  Assessment and Plan: Principal Problem:   Complication,  blocked Foley catheter, initial encounter [facility]) Active Problems:   Acute on chronic anemia   Malignant neoplasm of prostate (HCC)   Essential hypertension   Tobacco abuse    Assessment and Plan: * Complication, blocked Foley catheter, initial encounter [facility]) 63 year old male with history of prostate cancer s/p prostatectomy and radiation in 12/ 2023 who presented to ED initially with inability to void was sent home with foley catheter; however, he returned when foley was not draining and he had pain. Found to have blood clots requiring continuous irrigation.  -obs to med surg  -urology consulted, recommend continuous irrigation  -continues to have grossly bloody urine  -check CBC q 6 hours -hold ASA -transfuse if hgb <7  -strict I/O    Acute on chronic anemia Hx of blood transfusion in may 2025, received 1unit PRBC with hgb of 7.5, fecal occult negative at that time Hgb stable today at 8.1, but urine continues to be grossly bloody  Will type and screen, consents to blood transfusion if needed  Transfuse if hgb >7  Check iron studies   Malignant neoplasm of prostate (HCC) S/p prostatectomy in 11/2022 and radiation [facility]    Essential hypertension Continue norvasc  10mg , benzapril 40mg  and toprol -XL100mg  daily PRN lopressor  for elevated BP   Tobacco abuse Nicotine  patch Encourage cessation      Advance Care Planning:   Code Status: Full Code   Consults: urology  DVT Prophylaxis: SCDs   Family Communication: none   Severity of Illness: The appropriate patient status for this patient is OBSERVATION. Observation status is judged to be reasonable and necessary in order to provide the required intensity of service to ensure the patient's safety. The patient's presenting symptoms, physical exam findings, and initial radiographic and laboratory data in the context of their medical condition is felt to place them at decreased risk for further clinical  deterioration. Furthermore, it is anticipated that the patient will be medically stable for discharge from the hospital within 2 midnights of admission.   Author: Isaiah Geralds, MD 08/03/2024 7:59 PM  For on call review [email or web address].

## 2024-08-03 NOTE — Assessment & Plan Note (Addendum)
 Hx of blood transfusion in may 2025, received 1unit PRBC with hgb of 7.5, fecal occult negative at that time Hgb stable today at 8.1, but urine continues to be grossly bloody  Will type and screen, consents to blood transfusion if needed  Transfuse if hgb >7  Check iron studies

## 2024-08-03 NOTE — Assessment & Plan Note (Signed)
 63 year old male with history of prostate cancer s/p prostatectomy and radiation in 12/ 2023 who presented to ED initially with inability to void was sent home with foley catheter; however, he returned when foley was not draining and he had pain. Found to have blood clots requiring continuous irrigation.  -obs to med surg  -urology consulted, recommend continuous irrigation  -continues to have grossly bloody urine  -check CBC q 6 hours -hold ASA -transfuse if hgb <7  -strict I/O

## 2024-08-03 NOTE — Assessment & Plan Note (Signed)
 S/p prostatectomy in 11/2022 and radiation Followed by [facility]

## 2024-08-04 ENCOUNTER — Inpatient Hospital Stay (HOSPITAL_COMMUNITY)

## 2024-08-04 ENCOUNTER — Encounter (HOSPITAL_COMMUNITY): Payer: Self-pay | Admitting: Internal Medicine

## 2024-08-04 DIAGNOSIS — R310 Gross hematuria: Secondary | ICD-10-CM | POA: Diagnosis not present

## 2024-08-04 DIAGNOSIS — Z8546 Personal history of malignant neoplasm of prostate: Secondary | ICD-10-CM | POA: Diagnosis not present

## 2024-08-04 DIAGNOSIS — R319 Hematuria, unspecified: Secondary | ICD-10-CM | POA: Diagnosis present

## 2024-08-04 DIAGNOSIS — T83091A Other mechanical complication of indwelling urethral catheter, initial encounter: Secondary | ICD-10-CM | POA: Diagnosis not present

## 2024-08-04 DIAGNOSIS — R339 Retention of urine, unspecified: Secondary | ICD-10-CM | POA: Diagnosis not present

## 2024-08-04 LAB — URINALYSIS, ROUTINE W REFLEX MICROSCOPIC
Bilirubin Urine: NEGATIVE
Glucose, UA: NEGATIVE mg/dL
Ketones, ur: NEGATIVE mg/dL
Leukocytes,Ua: NEGATIVE
Nitrite: NEGATIVE
Protein, ur: 100 mg/dL — AB
RBC / HPF: >50 RBC/hpf (ref 0–5)
Specific Gravity, Urine: 1.004 — ABNORMAL LOW (ref 1.005–1.030)
pH: 6 (ref 5.0–8.0)

## 2024-08-04 LAB — BASIC METABOLIC PANEL WITH GFR
Anion gap: 12 (ref 5–15)
BUN: 15 mg/dL (ref 8–23)
CO2: 22 mmol/L (ref 22–32)
Calcium: 9.3 mg/dL (ref 8.9–10.3)
Chloride: 104 mmol/L (ref 98–111)
Creatinine, Ser: 1.08 mg/dL (ref 0.61–1.24)
GFR, Estimated: >60 mL/min (ref >60)
Glucose, Bld: 130 mg/dL — ABNORMAL HIGH (ref 70–99)
Potassium: 3.8 mmol/L (ref 3.5–5.1)
Sodium: 137 mmol/L (ref 135–145)

## 2024-08-04 LAB — CBC
HCT: 24 % — ABNORMAL LOW (ref 39.0–52.0)
Hemoglobin: 7.3 g/dL — ABNORMAL LOW (ref 13.0–17.0)
MCH: 23.3 pg — ABNORMAL LOW (ref 26.0–34.0)
MCHC: 30.4 g/dL (ref 30.0–36.0)
MCV: 76.7 fL — ABNORMAL LOW (ref 80.0–100.0)
Platelets: 274 K/uL (ref 150–400)
RBC: 3.13 MIL/uL — ABNORMAL LOW (ref 4.22–5.81)
RDW: 21.3 % — ABNORMAL HIGH (ref 11.5–15.5)
WBC: 9.3 K/uL (ref 4.0–10.5)
nRBC: 0 % (ref 0.0–0.2)

## 2024-08-04 LAB — HEMOGLOBIN AND HEMATOCRIT, BLOOD
HCT: 27.7 % — ABNORMAL LOW (ref 39.0–52.0)
Hemoglobin: 8.6 g/dL — ABNORMAL LOW (ref 13.0–17.0)

## 2024-08-04 LAB — PREPARE RBC (CROSSMATCH)

## 2024-08-04 LAB — HIV ANTIBODY (ROUTINE TESTING W REFLEX): HIV Screen 4th Generation wRfx: NONREACTIVE

## 2024-08-04 MED ORDER — CHLORHEXIDINE GLUCONATE CLOTH 2 % EX PADS
6.0000 | MEDICATED_PAD | Freq: Every day | CUTANEOUS | Status: DC
  Administered 2024-08-04: 6 via TOPICAL

## 2024-08-04 MED ORDER — SODIUM CHLORIDE 0.9% IV SOLUTION: Freq: Once | INTRAVENOUS | Status: AC

## 2024-08-04 MED ORDER — IOHEXOL 300 MG/ML  SOLN
100.0000 mL | Freq: Once | INTRAMUSCULAR | Status: AC | PRN
  Administered 2024-08-04: 100 mL via INTRAVENOUS

## 2024-08-04 NOTE — Progress Notes (Signed)
 Pt on CBI, several clots noted this shift, manual irrigation x1 via urology RN, and drainage bag exchange x1, pt urine now yellow and clear, denies pain at this time, minor discomfort noted, CBI rate adjusted from fast to moderate, will continue to monitor closely, pt educated, call bell in reach

## 2024-08-04 NOTE — Discharge Summary (Signed)
 [facility]  Physician Discharge Summary   Patient ID: Erik Gill MRN: [id number] DOB/AGE: 1961/11/17 63 y.o.  Admit date: 08/03/2024 Discharge date: 08/04/2024    PCP: Arloa Elsie SAUNDERS, MD  DISCHARGE DIAGNOSES:    Complication, blocked Foley catheter, initial encounter (HCC)   Acute on chronic anemia   Malignant neoplasm of prostate (HCC)   Essential hypertension   Tobacco abuse   Gross hematuria   Urinary retention   RECOMMENDATIONS FOR OUTPATIENT FOLLOW UP: Follow-up with urology    Home Health: None Equipment/Devices: None  CODE STATUS: Full code  DISCHARGE CONDITION: fair  Diet recommendation: As before  INITIAL HISTORY: 63 y.o. male with medical history significant of prostate cancer s/p prostatectomy in 2023 and salvage radiation therapy, HTN, HLD, tobacco abuse who presented to ED with complaints of clogged foley catheter.  He was seen on the morning of 8/31 in the emergency department with complaints of urinary retention.  A Foley catheter was placed and he was discharged home.  Came back with complaints of bleeding from his catheter and clogging of the Foley catheter.  Gross hematuria was noted.  Patient was hospitalized for further management.     Consultants: Urology  HOSPITAL COURSE:   Gross hematuria with urinary retention/mild hydronephrosis Patient presented with urinary retention initially and a Foley catheter was placed.  Subsequently came in with hematuria and clogged catheter.  Seen by urology.  Placed on continuous bladder irrigation.  CT scan was done which revealed mild hydroureteronephrosis on the left.  Bladder wall thickening was noted.  Filling defect in the urinary bladder was noted which is most likely due to recent hematuria.  This can be addressed by urology in the outpatient setting. Urology has seen the patient and cleared for discharge.   This is all in the setting of a history of prostate cancer and radiation treatment in the  past. UA did not suggest infection.   Acute blood loss anemia Significant drop in hemoglobin noted.  He was transfused 1 unit of PRBC with improvement in hemoglobin.   Prostate cancer He is status post prostatectomy in December 2023 and is also status post radiation treatment.   Essential hypertension   Tobacco abuse Counseled.  Nicotine  patch.   Obesity Estimated body mass index is 32.2 kg/m as calculated from the following:   Height as of this encounter: 5' 6 (1.676 m).   Weight as of this encounter: 90.5 kg.   Patient is stable.  Cleared by urology for discharge.  Okay for discharge with close follow-up with urology.   PERTINENT LABS:  The results of significant diagnostics from this hospitalization (including imaging, microbiology, ancillary and laboratory) are listed below for reference.      Labs:   Basic Metabolic Panel: Recent Labs  Lab 08/03/24 1639 08/04/24 0039  NA 132* 137  K 3.9 3.8  CL 98 104  CO2 21* 22  GLUCOSE 107* 130*  BUN 18 15  CREATININE 1.16 1.08  CALCIUM 9.6 9.3   Liver Function Tests: Recent Labs  Lab 08/03/24 1639  AST 44*  ALT 38  ALKPHOS 100  BILITOT 0.7  PROT 6.9  ALBUMIN  4.5    CBC: Recent Labs  Lab 08/03/24 1639 08/03/24 1937 08/04/24 0039 08/04/24 1513  WBC 12.3* 11.3* 9.3  --   NEUTROABS 10.1*  --   --   --   HGB 8.1* 8.1* 7.3* 8.6*  HCT 26.6* 27.2* 24.0* 27.7*  MCV 74.1* 75.1* 76.7*  --   PLT 299  298 274  --     IMAGING STUDIES CT HEMATURIA WORKUP Result Date: 08/04/2024 CLINICAL DATA:  Hematuria.  History of prostate cancer. EXAM: CT ABDOMEN AND PELVIS WITHOUT AND WITH CONTRAST TECHNIQUE: Multidetector CT imaging of the abdomen and pelvis was performed following the standard protocol before and following the bolus administration of intravenous contrast. RADIATION DOSE REDUCTION: This exam was performed according to the departmental dose-optimization program which includes automated exposure control,  adjustment of the mA and/or kV according to patient size and/or use of iterative reconstruction technique. CONTRAST:  OMNIPAQUE  IOHEXOL  300 MG/ML  SOLN COMPARISON:  03/28/2024. FINDINGS: Lower chest: Atelectasis is noted at the lung bases. Coronary artery calcification is seen. Hepatobiliary: There is a subcentimeter hypodensity in the right lobe of the liver which is too small to further characterize. No biliary ductal dilatation. The gallbladder is without stones. Pancreas: Unremarkable. No pancreatic ductal dilatation or surrounding inflammatory changes. Spleen: Normal in size without focal abnormality. Adrenals/Urinary Tract: The adrenal glands are within normal limits. No renal or ureteral calculus bilaterally. No hydronephrosis on the right. Mild hydroureteronephrosis is present on the left with perinephric and periureteral fat stranding on the left. No filling defect is seen in the ureters on delayed imaging. A Foley catheter is noted on the left. There is diffuse bladder wall thickening which may in part be due to under distension. There is a small filling defect in the posterior aspect of the urinary bladder, best seen on delayed sagittal image 72. Stomach/Bowel: The stomach is within normal limits. No bowel obstruction, free air, or pneumatosis is seen. Appendix appears normal. Vascular/Lymphatic: Aortic atherosclerosis. No enlarged abdominal or pelvic lymph nodes. Reproductive: Prostate gland is not seen. Other: No abdominopelvic ascites. Multifocal fat containing anterior abdominal wall hernias are noted in the upper abdomen. Musculoskeletal: Degenerative changes are present in the thoracolumbar spine. No acute osseous abnormality is seen. IMPRESSION: 1. Small filling defect in the posterior aspect of the urinary bladder on delayed imaging, possibly representing debris or blood products given history of hematuria. The possibility of underlying nodule cannot be completely excluded. Cystoscopy may be  beneficial for further evaluation. 2. Mild hydroureteronephrosis on the left with perinephric and periureteral fat stranding with no evidence of renal calculus. Bladder wall thickening is also noted. Findings may be infectious or inflammatory. 3. Aortic atherosclerosis and coronary artery calcification. Electronically Signed   By: Leita Birmingham M.D.   On: 08/04/2024 17:58    DISCHARGE EXAMINATION: See progress note from earlier today  DISPOSITION: Home  Discharge Instructions     Call MD for:  difficulty breathing, headache or visual disturbances   Complete by: As directed    Call MD for:  extreme fatigue   Complete by: As directed    Call MD for:  persistant dizziness or light-headedness   Complete by: As directed    Call MD for:  persistant nausea and vomiting   Complete by: As directed    Call MD for:  severe uncontrolled pain   Complete by: As directed    Call MD for:  temperature >100.4   Complete by: As directed    Diet - low sodium heart healthy   Complete by: As directed    Discharge instructions   Complete by: As directed    Follow instructions provided by Urology.  You were cared for by a hospitalist during your hospital stay. If you have any questions about your discharge medications or the care you received while you were in the  hospital after you are discharged, you can call the unit and asked to speak with the hospitalist on call if the hospitalist that took care of you is not available. Once you are discharged, your primary care physician will handle any further medical issues. Please note that NO REFILLS for any discharge medications will be authorized once you are discharged, as it is imperative that you return to your primary care physician (or establish a relationship with a primary care physician if you do not have one) for your aftercare needs so that they can reassess your need for medications and monitor your lab values. If you do not have a primary care physician,  you can call [phone] for a physician referral.   Increase activity slowly   Complete by: As directed          Allergies as of 08/04/2024       Reactions   Other    Seasonal Allergies that are year round        Medication List     STOP taking these medications    ASPIRIN PO   BC HEADACHE POWDER PO       TAKE these medications    acetaminophen  650 MG CR tablet Commonly known as: TYLENOL  Take 1,300 mg by mouth every 8 (eight) hours as needed for pain.   albuterol  108 (90 Base) MCG/ACT inhaler Commonly known as: VENTOLIN  HFA Inhale 2 puffs into the lungs every 4 (four) hours as needed for wheezing or shortness of breath.   amLODipine -benazepril  10-40 MG capsule Commonly known as: LOTREL Take 1 capsule by mouth in the morning.   Cialis 20 MG tablet Generic drug: tadalafil Take 20 mg by mouth daily as needed for erectile dysfunction.   clotrimazole -betamethasone  cream Commonly known as: LOTRISONE  Apply to affected area 2 times daily prn   ferrous sulfate  325 (65 FE) MG tablet Take 1 tablet (325 mg total) by mouth daily.   fluticasone  50 MCG/ACT nasal spray Commonly known as: FLONASE  Place 1 spray into both nostrils daily for 3 days. What changed: when to take this   metoprolol  succinate 100 MG 24 hr tablet Commonly known as: TOPROL -XL Take 100 mg by mouth in the morning.   Opcon-A  0.027-0.315 % Soln Generic drug: Naphazoline-Pheniramine Place 1 drop into both eyes daily as needed (allergies).   pantoprazole 40 MG tablet Commonly known as: PROTONIX Take 40 mg by mouth as needed.   ZyrTEC Allergy 10 MG tablet Generic drug: cetirizine Take 10 mg by mouth in the morning.           TOTAL DISCHARGE TIME: 35 minutes  [place] on [email or web address]  08/05/2024, 10:13 AM

## 2024-08-04 NOTE — Progress Notes (Signed)
   08/04/24 0959  TOC Brief Assessment  Insurance and Status Reviewed  Patient has primary care physician Yes  Home environment has been reviewed Apartment  Prior level of function: Independent  Prior/Current Home Services No current home services  Social Drivers of Health Review SDOH reviewed no interventions necessary  Readmission risk has been reviewed Yes  Transition of care needs no transition of care needs at this time    Signed: Heather Saltness, MSW, LCSW Clinical Social Worker Inpatient Care Management 08/04/2024 9:59 AM

## 2024-08-04 NOTE — Plan of Care (Signed)

## 2024-08-04 NOTE — Progress Notes (Signed)
 [facility] PROGRESS NOTE   Erik Gill FMW:[id number] DOB: Apr 10, 1961 DOA: 08/03/2024  PCP: Arloa Elsie SAUNDERS, MD  Brief History: 62 y.o. male with medical history significant of prostate cancer s/p prostatectomy in 2023 and salvage radiation therapy, HTN, HLD, tobacco abuse who presented to ED with complaints of clogged foley catheter.  He was seen on the morning of 8/31 in the emergency department with complaints of urinary retention.  A Foley catheter was placed and he was discharged home.  Came back with complaints of bleeding from his catheter and clogging of the Foley catheter.  Gross hematuria was noted.  Patient was hospitalized for further management.    Consultants: Urology  Procedures: None    Subjective/Interval History: Feels well this morning.  Denies any abdominal pain nausea or vomiting.  Agreeable to blood transfusion.    Assessment/Plan:  Gross hematuria with urinary retention Started on continuous bladder irrigation.  Urology to see the patient today. This is all in the setting of a history of prostate cancer and radiation treatment in the past. No imaging studies noted.  Considering his hematuria he may benefit from CT hematuria protocol which will be ordered.  Acute blood loss anemia Significant drop in hemoglobin noted.  Will transfuse 1 unit of PRBC to which the patient is agreeable.  Monitor CBCs.  Prostate cancer He is status post prostatectomy in December 2023 and is also status post radiation treatment.  Essential hypertension Noted to be on amlodipine , benazepril  and metoprolol  which is being continued.  Occasional high readings noted.  Continue to monitor.  Tobacco abuse Counseled.  Nicotine  patch.  Obesity Estimated body mass index is 32.2 kg/m as calculated from the following:   Height as of this encounter: 5' 6 (1.676 m).   Weight as of this encounter: 90.5 kg.  DVT Prophylaxis: SCDs Code Status: Full code Family  Communication: Discussed with patient Disposition Plan: Hopefully return home when cleared by urology  Status is: Observation Patient will spend more than 2 midnights in the hospital due to hematuria and need for blood transfusion      Medications: Scheduled:  sodium chloride    Intravenous Once   amLODipine   10 mg Oral Daily   And   benazepril   40 mg Oral Daily   ferrous sulfate   325 mg Oral Q breakfast   fluticasone   1 spray Each Nare Daily   loratadine   10 mg Oral Daily   metoprolol  succinate  100 mg Oral Daily   nicotine   14 mg Transdermal Daily   Continuous:  sodium chloride  irrigation     PRN:acetaminophen  **OR** acetaminophen , albuterol , HYDROcodone -acetaminophen , metoprolol  tartrate, ondansetron  **OR** ondansetron  (ZOFRAN ) IV  Antibiotics: Anti-infectives (From admission, onward)    None       Objective:  Vital Signs  Vitals:   08/03/24 2331 08/04/24 0414 08/04/24 0744 08/04/24 0949  BP: (!) 150/73 (!) 149/71 (!) 151/72 126/72  Pulse: 75 80 74 80  Resp: 14 14 16 14   Temp: 98.2 F (36.8 C) 98.6 F (37 C) 98.2 F (36.8 C) 98.4 F (36.9 C)  TempSrc: Oral Oral    SpO2: 99% 100% 99% 99%  Weight:      Height:        Intake/Output Summary (Last 24 hours) at 08/04/2024 1003 Last data filed at 08/04/2024 0900 Gross per 24 hour  Intake 72759 ml  Output 36175 ml  Net -8935 ml   Filed Weights   08/03/24 1410 08/03/24 1921  Weight: 93 kg 90.5 kg  General appearance: Awake alert.  In no distress Resp: Clear to auscultation bilaterally.  Normal effort Cardio: S1-S2 is normal regular.  No S3-S4.  No rubs murmurs or bruit GI: Abdomen is soft.  Nontender nondistended.  Bowel sounds are present normal.  No masses organomegaly Extremities: No edema.  Full range of motion of lower extremities. Neurologic: Alert and oriented x3.  No focal neurological deficits.    Lab Results:  Data Reviewed: I have personally reviewed following labs and reports of the  imaging studies  CBC: Recent Labs  Lab 08/03/24 1639 08/03/24 1937 08/04/24 0039  WBC 12.3* 11.3* 9.3  NEUTROABS 10.1*  --   --   HGB 8.1* 8.1* 7.3*  HCT 26.6* 27.2* 24.0*  MCV 74.1* 75.1* 76.7*  PLT 299 298 274    Basic Metabolic Panel: Recent Labs  Lab 08/03/24 1639 08/04/24 0039  NA 132* 137  K 3.9 3.8  CL 98 104  CO2 21* 22  GLUCOSE 107* 130*  BUN 18 15  CREATININE 1.16 1.08  CALCIUM 9.6 9.3    GFR: Estimated Creatinine Clearance: 74.7 mL/min (by C-G formula based on SCr of 1.08 mg/dL).  Liver Function Tests: Recent Labs  Lab 08/03/24 1639  AST 44*  ALT 38  ALKPHOS 100  BILITOT 0.7  PROT 6.9  ALBUMIN  4.5    Coagulation Profile: Recent Labs  Lab 08/03/24 1639  INR 1.1     Anemia Panel: Recent Labs    08/03/24 1758  FERRITIN 8*  TIBC 437  IRON 46    Radiology Studies: No results found.     LOS: 0 days   Christionna Poland [employer] on [email or web address]  08/04/2024, 10:03 AM

## 2024-08-04 NOTE — Consult Note (Signed)
 Urology Consult  Referring physician: Dr. Verdene Reason for referral: hematuria  Chief Complaint: Gross hematuria  History of Present Illness: Mr Erik Gill is a 63 yo with a history of prostate cancer who underwent prostatectomy in 2023 followed by salvage radiation who was admitted with worsening gross hematuria. He developed urinary retention 8/31 and had a foley placed in the ER. He then returned last night with worsening gross hematuria and a poorly draining foley catheter. The patient was started on CBI last night and his urine has cleared this morning. He is currently on slow drip CBI. UA from admission she RBCs, no WBCS and rare bacteria. Currently he denies nay pelvic pain. No urinary urgency.   Past Medical History:  Diagnosis Date   Elevated PSA    GERD (gastroesophageal reflux disease)    Headache    sinus related   Hypertension    Nocturia    Prostate cancer (HCC)    Wears glasses    Past Surgical History:  Procedure Laterality Date   HERNIA REPAIR Right    LYMPHADENECTOMY Bilateral 11/14/2021   Procedure: LYMPHADENECTOMY, PELVIC;  Surgeon: Renda Glance, MD;  Location: [facility];  Service: Urology;  Laterality: Bilateral;   PROSTATE BIOPSY N/A 08/29/2017   Procedure: BIOPSY TRANSRECTAL ULTRASONIC PROSTATE (TUBP);  Surgeon: Devere Lonni Righter, MD;  Location: [facility];  Service: Urology;  Laterality: N/A;   ROBOT ASSISTED LAPAROSCOPIC RADICAL PROSTATECTOMY N/A 11/14/2021   Procedure: XI ROBOTIC ASSISTED LAPAROSCOPIC RADICAL PROSTATECTOMY LEVEL 1;  Surgeon: Renda Glance, MD;  Location: [facility];  Service: Urology;  Laterality: N/A;    Medications: I have reviewed the patient's current medications. Allergies:  Allergies  Allergen Reactions   Other     Seasonal Allergies that are year round    Family History  Problem Relation Age of Onset   Cancer Father        colon cancer   Cancer Brother        prostate   Cancer Brother        prostate    Cancer Cousin        prostate cancer/maternal first cousin   Social History:  reports that he has been smoking cigarettes. He has a 0.5 pack-year smoking history. He has never used smokeless tobacco. He reports that he does not currently use alcohol. He reports that he does not use drugs.  Review of Systems  Genitourinary:  Positive for hematuria.  All other systems reviewed and are negative.   Physical Exam:  Vital signs in last 24 hours: Temp:  [97.4 F (36.3 C)-99 F (37.2 C)] 98 F (36.7 C) (09/01 1025) Pulse Rate:  [74-98] 76 (09/01 1025) Resp:  [14-20] 14 (09/01 1025) BP: (108-174)/(62-95) 138/71 (09/01 1025) SpO2:  [96 %-100 %] 100 % (09/01 1025) Weight:  [90.5 kg-93 kg] 90.5 kg (08/31 1921) Physical Exam Vitals reviewed.  Constitutional:      Appearance: Normal appearance.  HENT:     Head: Normocephalic and atraumatic.     Mouth/Throat:     Mouth: Mucous membranes are moist.  Eyes:     Extraocular Movements: Extraocular movements intact.     Pupils: Pupils are equal, round, and reactive to light.  Cardiovascular:     Rate and Rhythm: Normal rate and regular rhythm.  Pulmonary:     Effort: Pulmonary effort is normal. No respiratory distress.  Abdominal:     General: Abdomen is flat. There is no distension.  Genitourinary:    Penis: Normal.  Testes: Normal.  Musculoskeletal:        General: No swelling. Normal range of motion.     Cervical back: Normal range of motion and neck supple.  Skin:    General: Skin is warm and dry.  Neurological:     General: No focal deficit present.     Mental Status: He is alert and oriented to person, place, and time.  Psychiatric:        Mood and Affect: Mood normal.        Behavior: Behavior normal.        Thought Content: Thought content normal.        Judgment: Judgment normal.     Laboratory Data:  Results for orders placed or performed during the hospital encounter of 08/03/24 (from the past 72 hours)  CBC with  Differential     Status: Abnormal   Collection Time: 08/03/24  4:39 PM  Result Value Ref Range   WBC 12.3 (H) 4.0 - 10.5 K/uL   RBC 3.59 (L) 4.22 - 5.81 MIL/uL   Hemoglobin 8.1 (L) 13.0 - 17.0 g/dL    Comment: Reticulocyte Hemoglobin testing may be clinically indicated, consider ordering this additional test [id number]    HCT 26.6 (L) 39.0 - 52.0 %   MCV 74.1 (L) 80.0 - 100.0 fL   MCH 22.6 (L) 26.0 - 34.0 pg   MCHC 30.5 30.0 - 36.0 g/dL   RDW 78.8 (H) 88.4 - 84.4 %   Platelets 299 150 - 400 K/uL   nRBC 0.0 0.0 - 0.2 %   Neutrophils Relative % 83 %   Neutro Abs 10.1 (H) 1.7 - 7.7 K/uL   Lymphocytes Relative 9 %   Lymphs Abs 1.1 0.7 - 4.0 K/uL   Monocytes Relative 8 %   Monocytes Absolute 1.0 0.1 - 1.0 K/uL   Eosinophils Relative 0 %   Eosinophils Absolute 0.1 0.0 - 0.5 K/uL   Basophils Relative 0 %   Basophils Absolute 0.0 0.0 - 0.1 K/uL   Immature Granulocytes 0 %   Abs Immature Granulocytes 0.05 0.00 - 0.07 K/uL    Comment: Performed at [facility], [address] [place], [place] [address]  Comprehensive metabolic panel     Status: Abnormal   Collection Time: 08/03/24  4:39 PM  Result Value Ref Range   Sodium 132 (L) 135 - 145 mmol/L   Potassium 3.9 3.5 - 5.1 mmol/L   Chloride 98 98 - 111 mmol/L   CO2 21 (L) 22 - 32 mmol/L   Glucose, Bld 107 (H) 70 - 99 mg/dL    Comment: Glucose reference range applies only to samples taken after fasting for at least 8 hours.   BUN 18 8 - 23 mg/dL   Creatinine, Ser 8.83 0.61 - 1.24 mg/dL   Calcium 9.6 8.9 - 89.6 mg/dL   Total Protein 6.9 6.5 - 8.1 g/dL   Albumin  4.5 3.5 - 5.0 g/dL   AST 44 (H) 15 - 41 U/L   ALT 38 0 - 44 U/L   Alkaline Phosphatase 100 38 - 126 U/L   Total Bilirubin 0.7 0.0 - 1.2 mg/dL   GFR, Estimated >39 >39 mL/min    Comment: (NOTE) Calculated using the CKD-EPI Creatinine Equation (2021)    Anion gap 13 5 - 15    Comment: Performed at [facility], [address],  [place], [place] [address]  Protime-INR     Status: None   Collection Time: 08/03/24  4:39 PM  Result Value Ref Range   Prothrombin Time 15.1 11.4 - 15.2 seconds   INR 1.1 0.8 - 1.2    Comment: (NOTE) INR goal varies based on device and disease states. Performed at [facility], [address], [place], [place] [address]   Iron and TIBC     Status: Abnormal   Collection Time: 08/03/24  5:58 PM  Result Value Ref Range   Iron 46 45 - 182 ug/dL   TIBC 562 749 - 549 ug/dL   Saturation Ratios 11 (L) 17.9 - 39.5 %   UIBC 391 ug/dL    Comment: Performed at [facility], [address] [place], [place] [address]  Ferritin     Status: Abnormal   Collection Time: 08/03/24  5:58 PM  Result Value Ref Range   Ferritin 8 (L) 24 - 336 ng/mL    Comment: Performed at [facility], [address], [place], [place] [address]  Type and screen [facility]     Status: None (Preliminary result)   Collection Time: 08/03/24  7:34 PM  Result Value Ref Range   ABO/RH(D) O POS    Antibody Screen NEG    Sample Expiration 08/06/2024,2359    Unit Number [id number]    Blood Component Type RED CELLS,LR    Unit division 00    Status of Unit ISSUED    Transfusion Status OK TO TRANSFUSE    Crossmatch Result      Compatible Performed at [facility], [address], [place], [place] [address]   HIV Antibody (routine testing w rflx)     Status: None   Collection Time: 08/03/24  7:37 PM  Result Value Ref Range   HIV Screen 4th Generation wRfx Non Reactive Non Reactive    Comment: Performed at [facility], [address] [place], [place] [address]  CBC     Status: Abnormal   Collection Time: 08/03/24  7:37 PM  Result Value Ref Range   WBC 11.3 (H) 4.0 - 10.5 K/uL   RBC 3.62 (L) 4.22 - 5.81 MIL/uL   Hemoglobin 8.1 (L) 13.0 - 17.0 g/dL    Comment: Reticulocyte Hemoglobin testing may be clinically indicated, consider  ordering this additional test [id number]    HCT 27.2 (L) 39.0 - 52.0 %   MCV 75.1 (L) 80.0 - 100.0 fL   MCH 22.4 (L) 26.0 - 34.0 pg   MCHC 29.8 (L) 30.0 - 36.0 g/dL   RDW 78.8 (H) 88.4 - 84.4 %   Platelets 298 150 - 400 K/uL   nRBC 0.2 0.0 - 0.2 %    Comment: Performed at [facility], [address], [place], [place] [address]  Urinalysis, Routine w reflex microscopic -Urine, Catheterized     Status: Abnormal   Collection Time: 08/03/24 10:57 PM  Result Value Ref Range   Color, Urine RED (A) YELLOW   APPearance HAZY (A) CLEAR   Specific Gravity, Urine 1.004 (L) 1.005 - 1.030   pH 6.0 5.0 - 8.0   Glucose, UA NEGATIVE NEGATIVE mg/dL   Hgb urine dipstick MODERATE (A) NEGATIVE   Bilirubin Urine NEGATIVE NEGATIVE   Ketones, ur NEGATIVE NEGATIVE mg/dL   Protein, ur 899 (A) NEGATIVE mg/dL   Nitrite NEGATIVE NEGATIVE   Leukocytes,Ua NEGATIVE NEGATIVE   RBC / HPF >50 0 - 5 RBC/hpf   WBC, UA 0-5 0 - 5 WBC/hpf   Bacteria, UA RARE (  A) NONE SEEN   Squamous Epithelial / HPF 0-5 0 - 5 /HPF    Comment: Performed at [facility], [address], [place], [place] [address]  Basic metabolic panel     Status: Abnormal   Collection Time: 08/04/24 12:39 AM  Result Value Ref Range   Sodium 137 135 - 145 mmol/L   Potassium 3.8 3.5 - 5.1 mmol/L   Chloride 104 98 - 111 mmol/L   CO2 22 22 - 32 mmol/L   Glucose, Bld 130 (H) 70 - 99 mg/dL    Comment: Glucose reference range applies only to samples taken after fasting for at least 8 hours.   BUN 15 8 - 23 mg/dL   Creatinine, Ser 8.91 0.61 - 1.24 mg/dL   Calcium 9.3 8.9 - 89.6 mg/dL   GFR, Estimated >39 >39 mL/min    Comment: (NOTE) Calculated using the CKD-EPI Creatinine Equation (2021)    Anion gap 12 5 - 15    Comment: Performed at [facility], [address] [place], [place] [address]  CBC     Status: Abnormal   Collection Time: 08/04/24 12:39 AM  Result Value Ref Range   WBC 9.3 4.0 -  10.5 K/uL   RBC 3.13 (L) 4.22 - 5.81 MIL/uL   Hemoglobin 7.3 (L) 13.0 - 17.0 g/dL    Comment: Reticulocyte Hemoglobin testing may be clinically indicated, consider ordering this additional test [id number]    HCT 24.0 (L) 39.0 - 52.0 %   MCV 76.7 (L) 80.0 - 100.0 fL   MCH 23.3 (L) 26.0 - 34.0 pg   MCHC 30.4 30.0 - 36.0 g/dL   RDW 78.6 (H) 88.4 - 84.4 %   Platelets 274 150 - 400 K/uL   nRBC 0.0 0.0 - 0.2 %    Comment: Performed at [facility], [address] [place], [place] [address]  Prepare RBC (crossmatch)     Status: None   Collection Time: 08/04/24  8:38 AM  Result Value Ref Range   Order Confirmation      ORDER PROCESSED BY BLOOD BANK Performed at [facility], [address] [place], [place] [address]    No results found for this or any previous visit (from the past 240 hours). Creatinine: Recent Labs    08/03/24 1639 08/04/24 0039  CREATININE 1.16 1.08   Baseline Creatinine: 1  Impression/Assessment:  63yo with urinary retention and gross hematuria  Plan:  Gross hematuria: We discussed the natural history of gross hematuria and the various causes including iatrogenic, infectious, malignancy, radiation related, etc. Currently his urine is clear on slow drip CBI. CBI was discontinued. His hematuria was likely related to irritation from the foley in a patient with prior radiation therapy. Urinary retention: The patient foley should remain in place for 1 week and he can followup at [facility] for a voiding trial.  Belvie Clara 08/04/2024, 12:56 PM

## 2024-08-05 LAB — TYPE AND SCREEN
ABO/RH(D): O POS
Antibody Screen: NEGATIVE
Unit division: 0

## 2024-08-05 LAB — BPAM RBC
Blood Product Expiration Date: 202509272359
ISSUE DATE / TIME: 202509010957
Unit Type and Rh: 5100

## 2024-08-12 ENCOUNTER — Encounter (HOSPITAL_BASED_OUTPATIENT_CLINIC_OR_DEPARTMENT_OTHER): Attending: Internal Medicine | Admitting: Internal Medicine

## 2024-08-12 DIAGNOSIS — C61 Malignant neoplasm of prostate: Secondary | ICD-10-CM | POA: Insufficient documentation

## 2024-08-12 DIAGNOSIS — N3041 Irradiation cystitis with hematuria: Secondary | ICD-10-CM | POA: Insufficient documentation

## 2024-08-13 ENCOUNTER — Encounter (HOSPITAL_COMMUNITY)
Admission: RE | Admit: 2024-08-13 | Discharge: 2024-08-13 | Disposition: A | Discharge status: Home / Self Care | Source: Ambulatory Visit | Attending: Family Medicine | Admitting: Family Medicine

## 2024-08-13 DIAGNOSIS — I10 Essential (primary) hypertension: Secondary | ICD-10-CM | POA: Insufficient documentation

## 2024-08-13 DIAGNOSIS — Z01810 Encounter for preprocedural cardiovascular examination: Secondary | ICD-10-CM | POA: Diagnosis not present

## 2024-08-13 DIAGNOSIS — Z01818 Encounter for other preprocedural examination: Secondary | ICD-10-CM | POA: Diagnosis present

## 2024-08-13 DIAGNOSIS — R9431 Abnormal electrocardiogram [ECG] [EKG]: Secondary | ICD-10-CM | POA: Insufficient documentation

## 2024-08-15 ENCOUNTER — Telehealth: Payer: Self-pay

## 2024-08-15 NOTE — Telephone Encounter (Signed)
   Name: Terion Hedman  DOB: 11-09-1961  MRN: [id number]  Primary Cardiologist: None  Chart reviewed as part of pre-operative protocol coverage. Because of Kaycen Whitworth past medical history and time since last visit, he will require a follow-up in-office visit in order to better assess preoperative cardiovascular risk. Patient has not been seen by our practice and will need to be established first.   Pre-op covering staff: - Please schedule appointment and call patient to inform them. If patient already had an upcoming appointment within acceptable timeframe, please add pre-op clearance to the appointment notes so provider is aware. - Please contact requesting surgeon's office via preferred method (i.e, phone, fax) to inform them of need for appointment prior to surgery.    Lamarr Satterfield, NP  08/15/2024, 4:46 PM

## 2024-08-15 NOTE — Telephone Encounter (Signed)
   Pre-operative Risk Assessment    Patient Name: Erik Gill  DOB: 1961/10/06 MRN: [id number]   Date of last office visit: NONE Date of next office visit: NONE   Request for Surgical Clearance    Procedure:  HYPERBARIC OXYGEN  THERAPY  Date of Surgery:  Clearance TBD                                Surgeon:  HARLENE EASTERN, DO Surgeon's Group or Practice Name:  [facility] Phone number:  [phone] Fax number:  [phone]   Type of Clearance Requested:   - Medical    Type of Anesthesia:  Not Indicated   Additional requests/questions:    SignedLucie DELENA Ku   08/15/2024, 4:42 PM

## 2024-08-18 NOTE — Telephone Encounter (Signed)
 S/W patient and scheduled IN OFFICE New patient and Preop appt 08/20/24 with Georganna Archer, MD  Will update the surgeons office.

## 2024-08-19 NOTE — Progress Notes (Signed)
  Cardiology Office Note:   Date:  08/19/2024  ID:  Erik Gill, DOB Jan 19, 1961, MRN [id number] PCP: Arloa Elsie SAUNDERS, MD  [facility] Providers Cardiologist:  None { Chief Complaint: No chief complaint on file.     History of Present Illness:   Erik Gill is a 63 y.o. male with a PMH of HTN, HLD, prostate CA s/p prostatectomy (2023) and tobacco use disorder who presents for follow up ***.  Seen by wound care on 08/12/2024 and ECG revealed TWI's in the lateral leads prompting referral for cardiac evaluation and preop assessment.  Past Medical History:  Diagnosis Date   Elevated PSA    GERD (gastroesophageal reflux disease)    Headache    sinus related   Hypertension    Nocturia    Prostate cancer (HCC)    Wears glasses      Studies Reviewed:    EKG: ***           Risk Assessment/Calculations:   {Does this patient have ATRIAL FIBRILLATION?[phone]} No BP recorded.  {Refresh Note OR Click here to enter BP  :1}***        Physical Exam:     VS:  There were no vitals taken for this visit. ***    Wt Readings from Last 3 Encounters:  08/03/24 199 lb 8.3 oz (90.5 kg)  08/03/24 205 lb (93 kg)  06/20/24 199 lb 15.3 oz (90.7 kg)     GEN: Well nourished, well developed, in no acute distress NECK: No JVD; No carotid bruits CARDIAC: ***RRR, no murmurs, rubs, gallops RESPIRATORY:  Clear to auscultation without rales, wheezing or rhonchi  ABDOMEN: Soft, non-tender, non-distended, normal bowel sounds EXTREMITIES:  Warm and well perfused, no edema; No deformity, 2+ radial pulses PSYCH: Normal mood and affect   Assessment & Plan       {Are you ordering a CV Procedure (e.g. stress test, cath, DCCV, TEE, etc)?   Press F2        :[id number]}   This note was written with the assistance of a dictation microphone or AI dictation software. Please excuse any typos or grammatical errors.   Signed, Georganna Archer, MD 08/19/2024 5:13 PM    [facility]

## 2024-08-20 ENCOUNTER — Ambulatory Visit
Attending: Student in an Organized Health Care Education/Training Program | Admitting: Student in an Organized Health Care Education/Training Program

## 2024-08-20 ENCOUNTER — Encounter: Payer: Self-pay | Admitting: Student in an Organized Health Care Education/Training Program

## 2024-08-20 VITALS — BP 122/60 | HR 79 | Ht 68.0 in | Wt 202.0 lb

## 2024-08-20 DIAGNOSIS — R0989 Other specified symptoms and signs involving the circulatory and respiratory systems: Secondary | ICD-10-CM

## 2024-08-20 DIAGNOSIS — Z13220 Encounter for screening for lipoid disorders: Secondary | ICD-10-CM

## 2024-08-20 DIAGNOSIS — R011 Cardiac murmur, unspecified: Secondary | ICD-10-CM

## 2024-08-20 DIAGNOSIS — R9431 Abnormal electrocardiogram [ECG] [EKG]: Secondary | ICD-10-CM | POA: Diagnosis not present

## 2024-08-20 DIAGNOSIS — E785 Hyperlipidemia, unspecified: Secondary | ICD-10-CM

## 2024-08-20 DIAGNOSIS — Z01818 Encounter for other preprocedural examination: Secondary | ICD-10-CM

## 2024-08-20 DIAGNOSIS — Z720 Tobacco use: Secondary | ICD-10-CM

## 2024-08-20 MED ORDER — NICOTINE 14 MG/24HR TD PT24: 14.0000 mg | MEDICATED_PATCH | Freq: Every day | TRANSDERMAL | 2 refills | Status: DC

## 2024-08-20 NOTE — Assessment & Plan Note (Signed)
 He is actively smoking but is ready to quit.  He is interested in nicotine  patches.  I will prescribe today. -Nicotine  patches

## 2024-08-20 NOTE — Patient Instructions (Signed)
 Medication Instructions:  Your physician has recommended you make the following change in your medication:   1) START nicotine  patch 14 mg -- apply 1 patch daily, remove old patch before applying a new one, do not wear multiple patches.  *If you need a refill on your cardiac medications before your next appointment, please call your pharmacy*  Lab Work: TODAY: Lipid panel, LP(a) If you have labs (blood work) drawn today and your tests are completely normal, you will receive your results only by: MyChart Message (if you have MyChart) OR A paper copy in the mail If you have any lab test that is abnormal or we need to change your treatment, we will call you to review the results.  Testing/Procedures: Your physician has requested that you have an echocardiogram. Echocardiography is a painless test that uses sound waves to create images of your heart. It provides your doctor with information about the size and shape of your heart and how well your heart's chambers and valves are working. This procedure takes approximately one hour. There are no restrictions for this procedure. Please do NOT wear cologne, perfume, aftershave, or lotions (deodorant is allowed). Please arrive 15 minutes prior to your appointment time.  Please note: We ask at that you not bring children with you during ultrasound (echo/ vascular) testing. Due to room size and safety concerns, children are not allowed in the ultrasound rooms during exams. Our front office staff cannot provide observation of children in our lobby area while testing is being conducted. An adult accompanying a patient to their appointment will only be allowed in the ultrasound room at the discretion of the ultrasound technician under special circumstances. We apologize for any inconvenience.   Your physician has requested that you have a carotid duplex. This test is an ultrasound of the carotid arteries in your neck. It looks at blood flow through these  arteries that supply the brain with blood. Allow one hour for this exam. There are no restrictions or special instructions.   Follow-Up: At [facility], you and your health needs are our priority.  As part of our continuing mission to provide you with exceptional heart care, our providers are all part of one team.  This team includes your primary Cardiologist (physician) and Advanced Practice Providers or APPs (Physician Assistants and Nurse Practitioners) who all work together to provide you with the care you need, when you need it.  Your next appointment:   1 year(s)  Provider:   Georganna Archer, MD   We recommend signing up for the patient portal called MyChart.  Sign up information is provided on this After Visit Summary.  MyChart is used to connect with patients for Virtual Visits (Telemedicine).  Patients are able to view lab/test results, encounter notes, upcoming appointments, etc.  Non-urgent messages can be sent to your provider as well.    To learn more about what you can do with MyChart, go to [email or web address].   Other Instructions

## 2024-08-21 ENCOUNTER — Ambulatory Visit (HOSPITAL_BASED_OUTPATIENT_CLINIC_OR_DEPARTMENT_OTHER): Admitting: Internal Medicine

## 2024-08-21 DIAGNOSIS — C61 Malignant neoplasm of prostate: Secondary | ICD-10-CM

## 2024-08-21 DIAGNOSIS — N3041 Irradiation cystitis with hematuria: Secondary | ICD-10-CM | POA: Diagnosis not present

## 2024-08-21 DIAGNOSIS — D62 Acute posthemorrhagic anemia: Secondary | ICD-10-CM | POA: Diagnosis not present

## 2024-08-22 ENCOUNTER — Encounter (HOSPITAL_BASED_OUTPATIENT_CLINIC_OR_DEPARTMENT_OTHER): Admitting: Internal Medicine

## 2024-08-22 ENCOUNTER — Inpatient Hospital Stay (HOSPITAL_COMMUNITY)
Admission: EM | Admit: 2024-08-22 | Discharge: 2024-08-24 | DRG: 699 | Disposition: A | Discharge status: Home / Self Care | Attending: Internal Medicine | Admitting: Internal Medicine

## 2024-08-22 ENCOUNTER — Other Ambulatory Visit: Payer: Self-pay

## 2024-08-22 ENCOUNTER — Emergency Department (HOSPITAL_COMMUNITY)

## 2024-08-22 ENCOUNTER — Encounter (HOSPITAL_COMMUNITY): Payer: Self-pay

## 2024-08-22 DIAGNOSIS — K219 Gastro-esophageal reflux disease without esophagitis: Secondary | ICD-10-CM | POA: Diagnosis present

## 2024-08-22 DIAGNOSIS — I10 Essential (primary) hypertension: Secondary | ICD-10-CM | POA: Diagnosis present

## 2024-08-22 DIAGNOSIS — Z720 Tobacco use: Secondary | ICD-10-CM | POA: Diagnosis present

## 2024-08-22 DIAGNOSIS — C61 Malignant neoplasm of prostate: Secondary | ICD-10-CM | POA: Diagnosis present

## 2024-08-22 DIAGNOSIS — H6993 Unspecified Eustachian tube disorder, bilateral: Secondary | ICD-10-CM | POA: Insufficient documentation

## 2024-08-22 DIAGNOSIS — Z8042 Family history of malignant neoplasm of prostate: Secondary | ICD-10-CM

## 2024-08-22 DIAGNOSIS — E66811 Obesity, class 1: Secondary | ICD-10-CM | POA: Diagnosis present

## 2024-08-22 DIAGNOSIS — R310 Gross hematuria: Secondary | ICD-10-CM

## 2024-08-22 DIAGNOSIS — Z79899 Other long term (current) drug therapy: Secondary | ICD-10-CM

## 2024-08-22 DIAGNOSIS — D649 Anemia, unspecified: Secondary | ICD-10-CM

## 2024-08-22 DIAGNOSIS — R319 Hematuria, unspecified: Secondary | ICD-10-CM | POA: Diagnosis present

## 2024-08-22 DIAGNOSIS — Z6830 Body mass index (BMI) 30.0-30.9, adult: Secondary | ICD-10-CM

## 2024-08-22 DIAGNOSIS — D62 Acute posthemorrhagic anemia: Principal | ICD-10-CM | POA: Diagnosis present

## 2024-08-22 DIAGNOSIS — E872 Acidosis, unspecified: Secondary | ICD-10-CM | POA: Diagnosis present

## 2024-08-22 DIAGNOSIS — Z9079 Acquired absence of other genital organ(s): Secondary | ICD-10-CM

## 2024-08-22 DIAGNOSIS — E785 Hyperlipidemia, unspecified: Secondary | ICD-10-CM | POA: Diagnosis present

## 2024-08-22 DIAGNOSIS — Z923 Personal history of irradiation: Secondary | ICD-10-CM

## 2024-08-22 DIAGNOSIS — Z8546 Personal history of malignant neoplasm of prostate: Secondary | ICD-10-CM

## 2024-08-22 DIAGNOSIS — D75839 Thrombocytosis, unspecified: Secondary | ICD-10-CM | POA: Diagnosis not present

## 2024-08-22 DIAGNOSIS — J302 Other seasonal allergic rhinitis: Secondary | ICD-10-CM | POA: Diagnosis present

## 2024-08-22 DIAGNOSIS — N3041 Irradiation cystitis with hematuria: Principal | ICD-10-CM | POA: Diagnosis present

## 2024-08-22 DIAGNOSIS — F17210 Nicotine dependence, cigarettes, uncomplicated: Secondary | ICD-10-CM | POA: Diagnosis present

## 2024-08-22 LAB — IRON AND TIBC
Iron: 18 ug/dL — ABNORMAL LOW (ref 45–182)
Saturation Ratios: 4 % — ABNORMAL LOW (ref 17.9–39.5)
TIBC: 483 ug/dL — ABNORMAL HIGH (ref 250–450)
UIBC: 465 ug/dL

## 2024-08-22 LAB — PREPARE RBC (CROSSMATCH)

## 2024-08-22 LAB — COMPREHENSIVE METABOLIC PANEL WITH GFR
ALT: 20 U/L (ref 0–44)
AST: 25 U/L (ref 15–41)
Albumin: 4.4 g/dL (ref 3.5–5.0)
Alkaline Phosphatase: 73 U/L (ref 38–126)
Anion gap: 15 (ref 5–15)
BUN: 21 mg/dL (ref 8–23)
CO2: 20 mmol/L — ABNORMAL LOW (ref 22–32)
Calcium: 9.5 mg/dL (ref 8.9–10.3)
Chloride: 97 mmol/L — ABNORMAL LOW (ref 98–111)
Creatinine, Ser: 1.28 mg/dL — ABNORMAL HIGH (ref 0.61–1.24)
GFR, Estimated: >60 mL/min (ref >60)
Glucose, Bld: 117 mg/dL — ABNORMAL HIGH (ref 70–99)
Potassium: 4.6 mmol/L (ref 3.5–5.1)
Sodium: 133 mmol/L — ABNORMAL LOW (ref 135–145)
Total Bilirubin: <0.2 mg/dL (ref 0.0–1.2)
Total Protein: 6.3 g/dL — ABNORMAL LOW (ref 6.5–8.1)

## 2024-08-22 LAB — CBC
HCT: 16.3 % — ABNORMAL LOW (ref 39.0–52.0)
Hemoglobin: 4.7 g/dL — CL (ref 13.0–17.0)
MCH: 22.9 pg — ABNORMAL LOW (ref 26.0–34.0)
MCHC: 28.8 g/dL — ABNORMAL LOW (ref 30.0–36.0)
MCV: 79.5 fL — ABNORMAL LOW (ref 80.0–100.0)
Platelets: 457 K/uL — ABNORMAL HIGH (ref 150–400)
RBC: 2.05 MIL/uL — ABNORMAL LOW (ref 4.22–5.81)
RDW: 18.6 % — ABNORMAL HIGH (ref 11.5–15.5)
WBC: 9.9 K/uL (ref 4.0–10.5)
nRBC: 0.2 % (ref 0.0–0.2)

## 2024-08-22 LAB — RETICULOCYTES
Immature Retic Fract: 39.9 % — ABNORMAL HIGH (ref 2.3–15.9)
RBC.: 2.15 MIL/uL — ABNORMAL LOW (ref 4.22–5.81)
Retic Count, Absolute: 125.8 K/uL (ref 19.0–186.0)
Retic Ct Pct: 5.9 % — ABNORMAL HIGH (ref 0.4–3.1)

## 2024-08-22 LAB — VITAMIN B12: Vitamin B-12: 562 pg/mL (ref 180–914)

## 2024-08-22 LAB — FOLATE: Folate: 16.3 ng/mL (ref >5.9)

## 2024-08-22 MED ORDER — TRANEXAMIC ACID-NACL 1000-0.7 MG/100ML-% IV SOLN
1000.0000 mg | Freq: Once | INTRAVENOUS | Status: AC
  Administered 2024-08-22: 1000 mg via INTRAVENOUS
  Filled 2024-08-22: qty 100

## 2024-08-22 MED ORDER — ALUM & MAG HYDROXIDE-SIMETH 200-200-20 MG/5ML PO SUSP
30.0000 mL | Freq: Once | ORAL | Status: AC
  Administered 2024-08-22: 30 mL via ORAL
  Filled 2024-08-22: qty 30

## 2024-08-22 MED ORDER — IOHEXOL 300 MG/ML  SOLN
100.0000 mL | Freq: Once | INTRAMUSCULAR | Status: AC | PRN
  Administered 2024-08-22: 100 mL via INTRAVENOUS

## 2024-08-22 MED ORDER — SODIUM CHLORIDE 0.9% IV SOLUTION
Freq: Once | INTRAVENOUS | Status: AC
Start: 2024-08-22 — End: 2024-08-23

## 2024-08-22 NOTE — ED Triage Notes (Signed)
 Pt came in for weakness that started this morning. Pt stated he has had blood clots and has been passing blood in his catheter. Pt has been on radiation as well.

## 2024-08-22 NOTE — Progress Notes (Signed)
 I spoke to the emergency room doctor about this patient. He's being transfused and admitted to the hospital service. I reviewed his CT scan that demonstrates no blood clots within his bladder. I recommended that we start him on TXA 1300 mg TID. Please make him NPO past midnight, I will evaluate him in the morning and decide whether or not he needs bladder fulguration. if the patient's fully catheter starts to include, you will need a three-way fully catheter, at least 22 [place] and then start on continuous Irrigation. Please contact me with questions or concerns.

## 2024-08-22 NOTE — Progress Notes (Signed)
 Otolaryngology Office Note  HPI:    Erik Gill is a 63 y.o. male who presents as a consult  Patient.   Referring Provider: System, Provider Not In   Chief complaint: Ears.  HPI: He started hyperbaric oxygen  therapy yesterday for a bladder infection.  He had significant difficulty with pressurization of his ears.  He lost hearing temporarily but seems to be back to normal again.  PMH/Meds/All/SocHx/FamHx/ROS:   Medical History[1]  Surgical History[2]  No family history of bleeding disorders, wound healing problems or difficulty with anesthesia.      Current Medications[3]  A complete ROS was performed with pertinent positives/negatives noted in the HPI. The remainder of the ROS are negative.    Physical Exam:    BP 123/71   Pulse 73   Temp 98 F (36.7 C) (Temporal)   Ht 1.702 m (5' 7)   Wt 89.4 kg (197 lb)   BMI 30.85 kg/m    General:  Healthy and alert, in no distress, breathing easily. Normal affect. In a pleasant mood. Head: Normocephalic, atraumatic. No masses, or scars. Eyes: Pupils are equal, and reactive to light. Vision is grossly intact. No spontaneous or gaze nystagmus. Ears: Ear canals are clear. Tympanic membranes are intact, with normal landmarks and the middle ears are clear and healthy. Hearing: Grossly normal. Nose: Nasal cavities are clear with healthy mucosa, no polyps or exudate. Airways are patent. Face: No masses or scars, facial nerve function is symmetric. Oral Cavity: No mucosal abnormalities are noted. Tongue with normal mobility. Dentition appears healthy. Oropharynx: Tonsils are symmetric. There are no mucosal masses identified. Tongue base appears normal and healthy. Larynx/Hypopharynx: deferred Chest: Deferred Neck: No palpable masses, no cervical adenopathy, no thyroid nodules or enlargement. Neuro: Cranial nerves II-XII with normal function. Balance: Normal gate. Other findings: none.   Independent Review of Additional Tests  or Records:  none  Procedures:  none   Impression & Plans:  Normal-looking ears.  No signs of active barotrauma.  Recommend complete audiometric evaluation and then will place ventilation tubes on Monday so that he can proceed with his treatments without concern for the ears.   Medical Decision Making: #/Compl Problems  2     Data Rev  1  Management 2  (1-Straightforward, 2-Low, 3- Moderate, 4-High)  Ida VEAR Loader, MD        [1] History reviewed. No pertinent past medical history. [2] History reviewed. No pertinent surgical history. [3]  Current Outpatient Medications:  .  acetaminophen  (TYLENOL ) 650 mg ER tablet, 2 tablets., Disp: , Rfl:  .  albuterol  HFA (PROVENTIL  HFA;VENTOLIN  HFA;PROAIR  HFA) 90 mcg/actuation inhaler, Inhale 2 puffs every 4 (four) hours as needed for wheezing or shortness of breath., Disp: , Rfl:  .  amLODIPine -benazepril  (LOTREL 10-40) 10-40 mg per capsule, Take 1 capsule by mouth daily., Disp: , Rfl:  .  cetirizine (ZyrTEC) 10 mg tablet, Take 10 mg by mouth daily., Disp: , Rfl:  .  clotrimazole -betamethasone  (LOTRISONE ) 1-0.05 % cream, 1 Application., Disp: , Rfl:  .  ferrous sulfate  325 mg (65 mg iron) tablet, Take 1 tablet by mouth daily., Disp: , Rfl:  .  fluticasone  propionate (FLONASE ) 50 mcg/spray nasal spray, 2 sprays., Disp: , Rfl:  .  metoprolol  succinate (TOPROL  XL) 100 mg 24 hr tablet, Take 100 mg by mouth daily., Disp: , Rfl:  .  naphazoline-pheniramine (Opcon-A ) 0.02675-0.315 % drop eye drops, 1 drop into affected eye  as needed Ophthalmic Four times a day, Disp: , Rfl:  .  nicotine  (NICODERM CQ ) 14 mg/24 hr patch, Place 14 mg on the skin., Disp: , Rfl:  .  pantoprazole (PROTONIX) 40 mg EC tablet, TAKE 1 TABLET 1/2 TO 1 HOUR BY MOUTH BEFORE MORNING MEAL ONCE A DAY FOR 30 DAYS; Duration: 30, Disp: , Rfl:  .  triamcinolone  acetonide (KENALOG ) 0.1 % cream, 1 Application., Disp: , Rfl:

## 2024-08-22 NOTE — ED Provider Notes (Signed)
 [facility] AT [facility] Provider Note  CSN: [id number] Arrival date & time: 08/22/24 1701  Chief Complaint(s) Hematuria and Weakness  HPI Erik Gill is a 63 y.o. male with past medical history as below, significant for hematuria, chronic anemia, HLD, prostate cancer, hypertension who presents to the ED with complaint of generalized weakness  He was admitted 08/03/2024 with gross hematuria.  Patient underwent continuous bladder irrigation, voiding did improve, he received blood transfusion, he was discharged in stable condition. Patient is status post prostatectomy 2023 and salvage radiation therapy, he has been having ongoing hematuria.  Seems to have worsened in the past 2 days.  His catheter was exchanged on 3 weeks ago.  He still passing clots intermittently, still passing urine, no significant suprapubic abdominal pain.  He follows with [facility].  Patient reports over the past 24 hours he has been having worsening generalized weakness, he feels extremely fatigued.  He has been passing more blood in his urine, more blood clots in his urine.  No blood in the stool, no hematemesis.  He has had poor appetite the past couple days.  No chest pain but does have some exertional dyspnea.  Does not take blood thinners.   Past Medical History Past Medical History:  Diagnosis Date   Elevated PSA    GERD (gastroesophageal reflux disease)    Headache    sinus related   Hypertension    Nocturia    Prostate cancer (HCC)    Wears glasses    Patient Active Problem List   Diagnosis Date Noted   Gross hematuria 08/04/2024   Urinary retention 08/04/2024   Hematuria 08/04/2024   Complication, blocked Foley catheter, initial encounter (HCC) 08/03/2024   Acute on chronic anemia 08/03/2024   Elevated prostate specific antigen (PSA) 06/20/2024   Pure hypercholesterolemia 06/20/2024   Hyperlipidemia 06/20/2024   Erectile dysfunction 06/20/2024   Allergic  rhinitis due to pollen 06/20/2024   Allergic rhinitis 06/20/2024   Malignant tumor of prostate (HCC) 06/20/2024   HTN (hypertension) 06/20/2024   Tobacco abuse 06/20/2024   Family history of prostate cancer 03/01/2022   Genetic testing 03/01/2022   Elevated PSA 11/05/2021   Malignant neoplasm of prostate (HCC) 10/08/2017   Sinusitis, chronic 07/15/2016   Smoker 07/15/2016   Essential hypertension 07/15/2016   Home Medication(s) Prior to Admission medications   Medication Sig Start Date End Date Taking? Authorizing Provider  acetaminophen  (TYLENOL ) 650 MG CR tablet Take 1,300 mg by mouth every 8 (eight) hours as needed for pain.    [provider]  albuterol  (VENTOLIN  HFA) 108 (90 Base) MCG/ACT inhaler Inhale 2 puffs into the lungs every 4 (four) hours as needed for wheezing or shortness of breath. 06/20/24   Arloa Suzen RAMAN, NP  amLODipine -benazepril  (LOTREL) 10-40 MG capsule Take 1 capsule by mouth in the morning. 09/28/16   [provider]  cetirizine (ZYRTEC ALLERGY) 10 MG tablet Take 10 mg by mouth in the morning.    [provider]  clotrimazole -betamethasone  (LOTRISONE ) cream Apply to affected area 2 times daily prn 08/22/23   Billy Asberry FALCON, PA-C  ferrous sulfate  325 (65 FE) MG tablet Take 1 tablet (325 mg total) by mouth daily. 04/11/24 08/20/24  Cottie Donnice PARAS, MD  fluticasone  (FLONASE ) 50 MCG/ACT nasal spray Place 1 spray into both nostrils daily for 3 days. Patient taking differently: Place 1 spray into both nostrils in the morning. 05/19/22 08/20/24  Hazen Darryle BRAVO, FNP  metoprolol  succinate (TOPROL -XL) 100  MG 24 hr tablet Take 100 mg by mouth in the morning. Patient taking differently: Take 50 mg by mouth in the morning. 08/21/22   [provider]  Naphazoline-Pheniramine (OPCON-A ) 0.027-0.315 % SOLN Place 1 drop into both eyes daily as needed (allergies).    [provider]  nicotine  (NICODERM CQ  - DOSED IN MG/24 HOURS) 14 mg/24hr  patch Place 1 patch (14 mg total) onto the skin daily. 08/20/24   Floretta Mallard, MD  pantoprazole (PROTONIX) 40 MG tablet Take 40 mg by mouth as needed. 04/21/24   [provider]  sulfamethoxazole -trimethoprim  (BACTRIM  DS) 800-160 MG tablet Take 1 tablet by mouth 2 (two) times daily. 08/11/24   [provider]  tadalafil (CIALIS) 20 MG tablet Take 20 mg by mouth daily as needed for erectile dysfunction. 09/27/15   [provider]  triamcinolone  cream (KENALOG ) 0.1 % 1 Application as needed (skin irritation).    [provider]                                                                                                                                    Past Surgical History Past Surgical History:  Procedure Laterality Date   HERNIA REPAIR Right    LYMPHADENECTOMY Bilateral 11/14/2021   Procedure: LYMPHADENECTOMY, PELVIC;  Surgeon: Renda Glance, MD;  Location: [facility];  Service: Urology;  Laterality: Bilateral;   PROSTATE BIOPSY N/A 08/29/2017   Procedure: BIOPSY TRANSRECTAL ULTRASONIC PROSTATE (TUBP);  Surgeon: Devere Lonni Righter, MD;  Location: [facility];  Service: Urology;  Laterality: N/A;   ROBOT ASSISTED LAPAROSCOPIC RADICAL PROSTATECTOMY N/A 11/14/2021   Procedure: XI ROBOTIC ASSISTED LAPAROSCOPIC RADICAL PROSTATECTOMY LEVEL 1;  Surgeon: Renda Glance, MD;  Location: [facility];  Service: Urology;  Laterality: N/A;   Family History Family History  Problem Relation Age of Onset   Cancer Father        colon cancer   Cancer Brother        prostate   Cancer Brother        prostate   Cancer Cousin        prostate cancer/maternal first cousin    Social History Social History   Tobacco Use   Smoking status: Every Day    Current packs/day: 0.50    Average packs/day: 0.5 packs/day for 1 year (0.5 ttl pk-yrs)    Types: Cigarettes   Smokeless tobacco: Never   Tobacco comments:    0.5 PP2D  Vaping Use   Vaping status:  Never Used  Substance Use Topics   Alcohol use: Not Currently    Comment: OCCASIONAL   Drug use: No   Allergies Other  Review of Systems A thorough review of systems was obtained and all systems are negative except as noted in the HPI and PMH.   Physical Exam Vital Signs  I have reviewed the triage vital signs BP 124/61   Pulse 74  Temp 98.8 F (37.1 C) (Oral)   Resp 18   SpO2 100%  Physical Exam Vitals and nursing note reviewed.  Constitutional:      General: He is not in acute distress.    Appearance: Normal appearance. He is well-developed. He is not ill-appearing.  HENT:     Head: Normocephalic and atraumatic.     Right Ear: External ear normal.     Left Ear: External ear normal.     Nose: Nose normal.     Mouth/Throat:     Mouth: Mucous membranes are moist.  Eyes:     General: No scleral icterus.       Right eye: No discharge.        Left eye: No discharge.     Comments: Conjunctival pallor  Cardiovascular:     Rate and Rhythm: Normal rate and regular rhythm.  Pulmonary:     Effort: Pulmonary effort is normal. No respiratory distress.     Breath sounds: No stridor.  Abdominal:     General: Abdomen is flat. There is no distension.     Palpations: Abdomen is soft.     Tenderness: There is no abdominal tenderness. There is no guarding.     Comments: Foley catheter in place, blood-tinged urine in Foley bag  Musculoskeletal:        General: No deformity.     Cervical back: No rigidity.  Skin:    General: Skin is warm and dry.     Coloration: Skin is pale. Skin is not cyanotic or jaundiced.  Neurological:     Mental Status: He is alert.  Psychiatric:        Speech: Speech normal.        Behavior: Behavior normal. Behavior is cooperative.     ED Results and Treatments Labs (all labs ordered are listed, but only abnormal results are displayed) Labs Reviewed  COMPREHENSIVE METABOLIC PANEL WITH GFR - Abnormal; Notable for the following components:       Result Value   Sodium 133 (*)    Chloride 97 (*)    CO2 20 (*)    Glucose, Bld 117 (*)    Creatinine, Ser 1.28 (*)    Total Protein 6.3 (*)    All other components within normal limits  CBC - Abnormal; Notable for the following components:   RBC 2.05 (*)    Hemoglobin 4.7 (*)    HCT 16.3 (*)    MCV 79.5 (*)    MCH 22.9 (*)    MCHC 28.8 (*)    RDW 18.6 (*)    Platelets 457 (*)    All other components within normal limits  IRON AND TIBC - Abnormal; Notable for the following components:   Iron 18 (*)    TIBC 483 (*)    Saturation Ratios 4 (*)    All other components within normal limits  RETICULOCYTES - Abnormal; Notable for the following components:   Retic Ct Pct 5.9 (*)    RBC. 2.15 (*)    Immature Retic Fract 39.9 (*)    All other components within normal limits  VITAMIN B12  FOLATE  URINALYSIS, ROUTINE W REFLEX MICROSCOPIC  FERRITIN  TYPE AND SCREEN  PREPARE RBC (CROSSMATCH)  Radiology CT ABDOMEN PELVIS W CONTRAST Result Date: 08/22/2024 EXAM: CT ABDOMEN AND PELVIS WITH CONTRAST 08/22/2024 09:22:57 PM TECHNIQUE: CT of the abdomen and pelvis was performed with the administration of intravenous contrast. Multiplanar reformatted images are provided for review. Automated exposure control, iterative reconstruction, and/or weight-based adjustment of the mA/kV was utilized to reduce the radiation dose to as low as reasonably achievable. COMPARISON: 08/04/2024 CLINICAL HISTORY: Hematuria, gross/macroscopic. FINDINGS: LOWER CHEST: No acute abnormality. LIVER: The liver is unremarkable. GALLBLADDER AND BILE DUCTS: Gallbladder is decompressed. No biliary ductal dilatation. SPLEEN: No acute abnormality. PANCREAS: No acute abnormality. ADRENAL GLANDS: No acute abnormality. KIDNEYS, URETERS AND BLADDER: No stones in the kidneys or ureters. No hydronephrosis. No perinephric  or periureteral stranding. Mildly thick walled/irregular bladder with indwelling Foley catheter. GI AND BOWEL: Normal appendix (image 56). Stomach demonstrates no acute abnormality. There is no bowel obstruction. PERITONEUM AND RETROPERITONEUM: No ascites. No free air. Small mildly complex fat-containing midline supraumbilical hernia (sagittal image 107), without associated inflammatory changes. VASCULATURE: Atherosclerotic calcifications of the abdominal aorta and branch vessels. LYMPH NODES: No lymphadenopathy. REPRODUCTIVE ORGANS: Suspected prior prostatectomy. BONES AND SOFT TISSUES: Mild degenerative changes of the visualized thoracolumbar spine. No focal soft tissue abnormality. IMPRESSION: 1. Mildly thick walled/irregular bladder with indwelling Foley catheter. While this appearance may be related to radiation changes, primary bladder tumor is not excluded. 2. Suspected prior prostatectomy. No findings suspicious for metastatic disease. Electronically signed by: Pinkie Pebbles MD 08/22/2024 09:27 PM EDT RP Workstation: [id number]    Pertinent labs & imaging results that were available during my care of the patient were reviewed by me and considered in my medical decision making (see MDM for details).  Medications Ordered in ED Medications  0.9 %  sodium chloride  infusion (Manually program via Guardrails IV Fluids) ( Intravenous New Bag/Given 08/22/24 2134)  alum & mag hydroxide-simeth (MAALOX/MYLANTA) 200-200-20 MG/5ML suspension 30 mL (30 mLs Oral Given 08/22/24 2122)  iohexol  (OMNIPAQUE ) 300 MG/ML solution 100 mL (100 mLs Intravenous Contrast Given 08/22/24 2112)                                                                                                                                     Procedures .Critical Care  Performed by: Elnor Jayson LABOR, DO Authorized by: Elnor Jayson LABOR, DO   Critical care provider statement:    Critical care time (minutes):  45   Critical care time was  exclusive of:  Separately billable procedures and treating other patients   Critical care was necessary to treat or prevent imminent or life-threatening deterioration of the following conditions:  Circulatory failure   Critical care was time spent personally by me on the following activities:  Development of treatment plan with patient or surrogate, discussions with consultants, evaluation of patient's response to treatment, examination of patient, ordering and review of laboratory studies, ordering and review of radiographic studies, ordering and performing treatments and interventions, pulse  oximetry, re-evaluation of patient's condition, review of old charts and obtaining history from patient or surrogate   Care discussed with: admitting provider     (including critical care time)  Medical Decision Making / ED Course    Medical Decision Making:    Nole Robey is a 63 y.o. male with past medical history as below, significant for hematuria, chronic anemia, HLD, prostate cancer, hypertension who presents to the ED with complaint of generalized weakness. The complaint involves an extensive differential diagnosis and also carries with it a high risk of complications and morbidity.  Serious etiology was considered. Ddx includes but is not limited to: Infection, hemorrhagic cystitis, AVM, neoplasm, urethral trauma, dehydration, electrolyte derangement, anemia, malnutrition, etc.  Complete initial physical exam performed, notably the patient was in no acute distress, he does appear pale.    Reviewed and confirmed nursing documentation for past medical history, family history, social history.  Vital signs reviewed.    Gross hematuria Acute blood loss anemia > - Patient with ongoing hematuria since admission last month, seems to be worsening.  History of radical prostatectomy, radiation treatment, follows with [facility] - Patient with bright red blood in Foley, passing clots intermittently,  does not appear to be retaining  - Hemoglobin today is 4.7 baseline seems to be around 8.  He is having increased fatigue, exertional dyspnea, lightheadedness likely symptomatic anemia - Patient consented for PRBC, 2 units ordered. - CT abdomen pelvis with abnormal appearance of bladder, possibly secondary to radiation or neoplasm. - Urology consulted Dr Cam, recommend give TXA, NPO @[facility]  - Admit [facility] Dr Arthea  - pt/family agreeable to plan                     Additional history obtained: -Additional history obtained from family -External records from outside source obtained and reviewed including: Chart review including previous notes, labs, imaging, consultation notes including  Prior admission, prior labs and imaging   Lab Tests: -I ordered, reviewed, and interpreted labs.   The pertinent results include:   Labs Reviewed  COMPREHENSIVE METABOLIC PANEL WITH GFR - Abnormal; Notable for the following components:      Result Value   Sodium 133 (*)    Chloride 97 (*)    CO2 20 (*)    Glucose, Bld 117 (*)    Creatinine, Ser 1.28 (*)    Total Protein 6.3 (*)    All other components within normal limits  CBC - Abnormal; Notable for the following components:   RBC 2.05 (*)    Hemoglobin 4.7 (*)    HCT 16.3 (*)    MCV 79.5 (*)    MCH 22.9 (*)    MCHC 28.8 (*)    RDW 18.6 (*)    Platelets 457 (*)    All other components within normal limits  IRON AND TIBC - Abnormal; Notable for the following components:   Iron 18 (*)    TIBC 483 (*)    Saturation Ratios 4 (*)    All other components within normal limits  RETICULOCYTES - Abnormal; Notable for the following components:   Retic Ct Pct 5.9 (*)    RBC. 2.15 (*)    Immature Retic Fract 39.9 (*)    All other components within normal limits  VITAMIN B12  FOLATE  URINALYSIS, ROUTINE W REFLEX MICROSCOPIC  FERRITIN  TYPE AND SCREEN  PREPARE RBC (CROSSMATCH)    Notable for as above, acutely anemic  EKG    EKG  Interpretation Date/Time:  Friday August 22 2024 17:42:06 EDT Ventricular Rate:  74 PR Interval:  192 QRS Duration:  97 QT Interval:  390 QTC Calculation: 433 R Axis:   49  Text Interpretation: Sinus rhythm Confirmed by Elnor Savant ([id number]) on 08/22/2024 7:34:35 PM         Imaging Studies ordered: I ordered imaging studies including CT abdomen pelvis I independently visualized the following imaging with scope of interpretation limited to determining acute life threatening conditions related to emergency care; findings noted above I agree with the radiologist interpretation If any imaging was obtained with contrast I closely monitored patient for any possible adverse reaction a/w contrast administration in the emergency department   Medicines ordered and prescription drug management: Meds ordered this encounter  Medications   0.9 %  sodium chloride  infusion (Manually program via Guardrails IV Fluids)   alum & mag hydroxide-simeth (MAALOX/MYLANTA) 200-200-20 MG/5ML suspension 30 mL   iohexol  (OMNIPAQUE ) 300 MG/ML solution 100 mL    -I have reviewed the patients home medicines and have made adjustments as needed   Consultations Obtained: I requested consultation with the hospitalist, urology,  and discussed lab and imaging findings as well as pertinent plan   Cardiac Monitoring: The patient was maintained on a cardiac monitor.  I personally viewed and interpreted the cardiac monitored which showed an underlying rhythm of: NSR Continuous pulse oximetry interpreted by myself, 100% on RA.    Social Determinants of Health:  Diagnosis or treatment significantly limited by social determinants of health: current smoker   Reevaluation: After the interventions noted above, I reevaluated the patient and found that they have improved  Co morbidities that complicate the patient evaluation  Past Medical History:  Diagnosis Date   Elevated PSA    GERD (gastroesophageal reflux  disease)    Headache    sinus related   Hypertension    Nocturia    Prostate cancer (HCC)    Wears glasses       Dispostion: Disposition decision including need for hospitalization was considered, and patient admitted to the hospital.    Final Clinical Impression(s) / ED Diagnoses Final diagnoses:  Gross hematuria  Acute blood loss anemia        Elnor Savant LABOR, DO 08/22/24 2158

## 2024-08-22 NOTE — ED Notes (Signed)
Pt assisted to the restroom

## 2024-08-23 DIAGNOSIS — E785 Hyperlipidemia, unspecified: Secondary | ICD-10-CM | POA: Diagnosis present

## 2024-08-23 DIAGNOSIS — I10 Essential (primary) hypertension: Secondary | ICD-10-CM

## 2024-08-23 DIAGNOSIS — Z720 Tobacco use: Secondary | ICD-10-CM

## 2024-08-23 DIAGNOSIS — N3091 Cystitis, unspecified with hematuria: Secondary | ICD-10-CM

## 2024-08-23 DIAGNOSIS — Z8546 Personal history of malignant neoplasm of prostate: Secondary | ICD-10-CM | POA: Diagnosis not present

## 2024-08-23 DIAGNOSIS — D62 Acute posthemorrhagic anemia: Secondary | ICD-10-CM | POA: Diagnosis present

## 2024-08-23 DIAGNOSIS — Z923 Personal history of irradiation: Secondary | ICD-10-CM | POA: Diagnosis not present

## 2024-08-23 DIAGNOSIS — J302 Other seasonal allergic rhinitis: Secondary | ICD-10-CM | POA: Diagnosis present

## 2024-08-23 DIAGNOSIS — R311 Benign essential microscopic hematuria: Secondary | ICD-10-CM | POA: Diagnosis not present

## 2024-08-23 DIAGNOSIS — Z6830 Body mass index (BMI) 30.0-30.9, adult: Secondary | ICD-10-CM | POA: Diagnosis not present

## 2024-08-23 DIAGNOSIS — E66811 Obesity, class 1: Secondary | ICD-10-CM | POA: Diagnosis present

## 2024-08-23 DIAGNOSIS — Z8042 Family history of malignant neoplasm of prostate: Secondary | ICD-10-CM | POA: Diagnosis not present

## 2024-08-23 DIAGNOSIS — N3041 Irradiation cystitis with hematuria: Secondary | ICD-10-CM | POA: Diagnosis present

## 2024-08-23 DIAGNOSIS — Z9079 Acquired absence of other genital organ(s): Secondary | ICD-10-CM | POA: Diagnosis not present

## 2024-08-23 DIAGNOSIS — N3040 Irradiation cystitis without hematuria: Secondary | ICD-10-CM

## 2024-08-23 DIAGNOSIS — C61 Malignant neoplasm of prostate: Secondary | ICD-10-CM | POA: Diagnosis present

## 2024-08-23 DIAGNOSIS — E872 Acidosis, unspecified: Secondary | ICD-10-CM | POA: Diagnosis present

## 2024-08-23 DIAGNOSIS — Z79899 Other long term (current) drug therapy: Secondary | ICD-10-CM | POA: Diagnosis not present

## 2024-08-23 DIAGNOSIS — D75839 Thrombocytosis, unspecified: Secondary | ICD-10-CM | POA: Diagnosis not present

## 2024-08-23 DIAGNOSIS — K219 Gastro-esophageal reflux disease without esophagitis: Secondary | ICD-10-CM | POA: Diagnosis present

## 2024-08-23 DIAGNOSIS — F17210 Nicotine dependence, cigarettes, uncomplicated: Secondary | ICD-10-CM | POA: Diagnosis present

## 2024-08-23 LAB — URINALYSIS, ROUTINE W REFLEX MICROSCOPIC
Bilirubin Urine: NEGATIVE
Glucose, UA: NEGATIVE mg/dL
Ketones, ur: NEGATIVE mg/dL
Leukocytes,Ua: NEGATIVE
Nitrite: NEGATIVE
Protein, ur: 100 mg/dL — AB
Specific Gravity, Urine: 1.017 (ref 1.005–1.030)
pH: 8 (ref 5.0–8.0)

## 2024-08-23 LAB — CBC
HCT: 25.1 % — ABNORMAL LOW (ref 39.0–52.0)
HCT: 27.5 % — ABNORMAL LOW (ref 39.0–52.0)
HCT: 26.2 % — ABNORMAL LOW (ref 39.0–52.0)
Hemoglobin: 7.5 g/dL — ABNORMAL LOW (ref 13.0–17.0)
Hemoglobin: 8.4 g/dL — ABNORMAL LOW (ref 13.0–17.0)
Hemoglobin: 8 g/dL — ABNORMAL LOW (ref 13.0–17.0)
MCH: 24.8 pg — ABNORMAL LOW (ref 26.0–34.0)
MCH: 24.9 pg — ABNORMAL LOW (ref 26.0–34.0)
MCH: 25.1 pg — ABNORMAL LOW (ref 26.0–34.0)
MCHC: 29.9 g/dL — ABNORMAL LOW (ref 30.0–36.0)
MCHC: 30.5 g/dL (ref 30.0–36.0)
MCHC: 30.5 g/dL (ref 30.0–36.0)
MCV: 82.8 fL (ref 80.0–100.0)
MCV: 81.6 fL (ref 80.0–100.0)
MCV: 82.1 fL (ref 80.0–100.0)
Platelets: 340 K/uL (ref 150–400)
Platelets: 397 K/uL (ref 150–400)
Platelets: 368 K/uL (ref 150–400)
RBC: 3.03 MIL/uL — ABNORMAL LOW (ref 4.22–5.81)
RBC: 3.37 MIL/uL — ABNORMAL LOW (ref 4.22–5.81)
RBC: 3.19 MIL/uL — ABNORMAL LOW (ref 4.22–5.81)
RDW: 17 % — ABNORMAL HIGH (ref 11.5–15.5)
RDW: 16.9 % — ABNORMAL HIGH (ref 11.5–15.5)
RDW: 16.9 % — ABNORMAL HIGH (ref 11.5–15.5)
WBC: 8.7 K/uL (ref 4.0–10.5)
WBC: 10.2 K/uL (ref 4.0–10.5)
WBC: 9.2 K/uL (ref 4.0–10.5)
nRBC: 0.2 % (ref 0.0–0.2)
nRBC: 0.2 % (ref 0.0–0.2)
nRBC: 0 % (ref 0.0–0.2)

## 2024-08-23 LAB — BASIC METABOLIC PANEL WITH GFR
Anion gap: 13 (ref 5–15)
BUN: 15 mg/dL (ref 8–23)
CO2: 20 mmol/L — ABNORMAL LOW (ref 22–32)
Calcium: 9 mg/dL (ref 8.9–10.3)
Chloride: 102 mmol/L (ref 98–111)
Creatinine, Ser: 1.23 mg/dL (ref 0.61–1.24)
GFR, Estimated: >60 mL/min (ref >60)
Glucose, Bld: 94 mg/dL (ref 70–99)
Potassium: 4.5 mmol/L (ref 3.5–5.1)
Sodium: 134 mmol/L — ABNORMAL LOW (ref 135–145)

## 2024-08-23 LAB — HEMOGLOBIN AND HEMATOCRIT, BLOOD
HCT: 27 % — ABNORMAL LOW (ref 39.0–52.0)
Hemoglobin: 8.5 g/dL — ABNORMAL LOW (ref 13.0–17.0)

## 2024-08-23 LAB — PREPARE RBC (CROSSMATCH)

## 2024-08-23 LAB — FERRITIN: Ferritin: 14 ng/mL — ABNORMAL LOW (ref 24–336)

## 2024-08-23 MED ORDER — ONDANSETRON HCL 4 MG/2ML IJ SOLN: 4.0000 mg | Freq: Four times a day (QID) | INTRAMUSCULAR | Status: DC | PRN

## 2024-08-23 MED ORDER — DIPHENHYDRAMINE HCL 25 MG PO CAPS
25.0000 mg | ORAL_CAPSULE | Freq: Once | ORAL | Status: AC
  Administered 2024-08-23: 25 mg via ORAL
  Filled 2024-08-23: qty 1

## 2024-08-23 MED ORDER — METOPROLOL SUCCINATE ER 50 MG PO TB24
150.0000 mg | ORAL_TABLET | Freq: Every day | ORAL | Status: DC
  Administered 2024-08-24: 150 mg via ORAL
  Filled 2024-08-23: qty 1

## 2024-08-23 MED ORDER — ACETAMINOPHEN 650 MG RE SUPP: 650.0000 mg | Freq: Four times a day (QID) | RECTAL | Status: DC | PRN

## 2024-08-23 MED ORDER — ONDANSETRON HCL 4 MG PO TABS: 4.0000 mg | ORAL_TABLET | Freq: Four times a day (QID) | ORAL | Status: DC | PRN

## 2024-08-23 MED ORDER — CHLORHEXIDINE GLUCONATE CLOTH 2 % EX PADS: 6.0000 | MEDICATED_PAD | Freq: Every day | CUTANEOUS | Status: DC

## 2024-08-23 MED ORDER — SODIUM CHLORIDE 0.9% IV SOLUTION: Freq: Once | INTRAVENOUS | Status: AC

## 2024-08-23 MED ORDER — ACETAMINOPHEN 325 MG PO TABS: 650.0000 mg | ORAL_TABLET | Freq: Four times a day (QID) | ORAL | Status: DC | PRN

## 2024-08-23 MED ORDER — PANTOPRAZOLE SODIUM 40 MG PO TBEC
40.0000 mg | DELAYED_RELEASE_TABLET | ORAL | Status: DC | PRN
  Administered 2024-08-23: 40 mg via ORAL
  Filled 2024-08-23: qty 1

## 2024-08-23 MED ORDER — BISACODYL 5 MG PO TBEC: 5.0000 mg | DELAYED_RELEASE_TABLET | Freq: Every day | ORAL | Status: DC | PRN

## 2024-08-23 MED ORDER — FERROUS SULFATE 325 (65 FE) MG PO TABS
325.0000 mg | ORAL_TABLET | Freq: Every day | ORAL | Status: DC
  Administered 2024-08-23 – 2024-08-24 (×2): 325 mg via ORAL
  Filled 2024-08-23 (×2): qty 1

## 2024-08-23 MED ORDER — TRANEXAMIC ACID 650 MG PO TABS
1300.0000 mg | ORAL_TABLET | Freq: Three times a day (TID) | ORAL | Status: DC
  Administered 2024-08-23 – 2024-08-24 (×3): 1300 mg via ORAL
  Filled 2024-08-23 (×6): qty 2

## 2024-08-23 MED ORDER — NICOTINE 14 MG/24HR TD PT24
14.0000 mg | MEDICATED_PATCH | Freq: Every day | TRANSDERMAL | Status: DC
  Administered 2024-08-23 – 2024-08-24 (×2): 14 mg via TRANSDERMAL
  Filled 2024-08-23 (×2): qty 1

## 2024-08-23 MED ORDER — SODIUM CHLORIDE 0.9 % IR SOLN: 3000.0000 mL | Status: DC
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
## 2024-08-23 NOTE — ED Notes (Addendum)
 Unable to irrigate original catheter patient arrived to ED with and patient unable to urinate. MD notified to which new orders were obtained. When setting up to insert a 20fr catheter for bladder irrigation, patient coughed and a 6 in string of blood clots came from patient's penis and patient became to urinate freely lightly red tinged urine with relief. Urology notified and will be here soon to evaluate patient. Instructed to leave catheter out at this time.
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
## 2024-08-23 NOTE — ED Notes (Signed)
 Urology paged out for [place], RN

## 2024-08-23 NOTE — Progress Notes (Signed)
 Urine output monitored with string of bottles. Fourth bottle contained clear, bright red urine with one quarter-sized clot expelled.

## 2024-08-23 NOTE — Consult Note (Signed)
 I have been asked to see the patient by Dr. Alejandro Marker, for evaluation and management of gross hematuria.  History of present illness: Unfortunate 63 year old male with a history of advanced prostate cancer status post radical prostatectomy with adjuvant radiation therapy.  Over the course of the last several months he has been dealing with radiation cystitis.  While he was seen in the ER where several weeks prior and a catheter was placed  He was then to referred to hyperbaric oxygen , which he had aspiration last week.  He presented to the emergency department yesterday evening with fatigue and persistent gross hematuria.  He was found to have a hemoglobin of 4.7.  In the ER, the abdominal pain he was given several units of blood.  He was also started on TXA.  His Foley catheter was draining initially, but then stopped draining.  I recommended that this be upsized to a 2 4 French catheter and upon removing the Foley catheter during the exchange he voided a large clot.  Subsequent urine was only pink-tinged.  At that point recommended to the nurse to hold off on any Foley catheter placement.  In addition, CT scan was performed in the emergency department and did not demonstrate a large  blood clot within the bladder.  She was    Review of systems: A 12 point comprehensive review of systems was obtained and is negative unless otherwise stated in the history of present illness.  Patient Active Problem List   Diagnosis Date Noted   Acute blood loss anemia (ABLA) 08/23/2024   Dysfunction of both eustachian tubes 08/22/2024   Gross hematuria 08/04/2024   Urinary retention 08/04/2024   Hematuria 08/04/2024   Complication, blocked Foley catheter, initial encounter (HCC) 08/03/2024   Acute on chronic anemia 08/03/2024   Elevated prostate specific antigen (PSA) 06/20/2024   Pure hypercholesterolemia 06/20/2024   Hyperlipidemia 06/20/2024   Erectile dysfunction 06/20/2024   Allergic rhinitis  due to pollen 06/20/2024   Allergic rhinitis 06/20/2024   Malignant tumor of prostate (HCC) 06/20/2024   HTN (hypertension) 06/20/2024   Tobacco abuse 06/20/2024   Family history of prostate cancer 03/01/2022   Genetic testing 03/01/2022   Elevated PSA 11/05/2021   Malignant neoplasm of prostate (HCC) 10/08/2017   Sinusitis, chronic 07/15/2016   Smoker 07/15/2016   Essential hypertension 07/15/2016    No current facility-administered medications on file prior to encounter.   Current Outpatient Medications on File Prior to Encounter  Medication Sig Dispense Refill   acetaminophen  (TYLENOL ) 650 MG CR tablet Take 1,300 mg by mouth every 8 (eight) hours as needed for pain.     albuterol  (VENTOLIN  HFA) 108 (90 Base) MCG/ACT inhaler Inhale 2 puffs into the lungs every 4 (four) hours as needed for wheezing or shortness of breath. 8 g 0   amLODipine -benazepril  (LOTREL) 10-40 MG capsule Take 1 capsule by mouth in the morning.     cetirizine (ZYRTEC ALLERGY) 10 MG tablet Take 10 mg by mouth in the morning.     clotrimazole -betamethasone  (LOTRISONE ) cream Apply to affected area 2 times daily prn 45 g 0   ferrous sulfate  325 (65 FE) MG tablet Take 1 tablet (325 mg total) by mouth daily. 30 tablet 1   fluticasone  (FLONASE ) 50 MCG/ACT nasal spray Place 1 spray into both nostrils daily for 3 days. (Patient taking differently: Place 1 spray into both nostrils in the morning.) 16 g 0   metoprolol  succinate (TOPROL -XL) 100 MG 24 hr tablet Take 100  mg by mouth in the morning. (Patient taking differently: Take 150 mg by mouth in the morning.)     Naphazoline-Pheniramine (OPCON-A ) 0.027-0.315 % SOLN Place 1 drop into both eyes daily as needed (allergies).     pantoprazole  (PROTONIX ) 40 MG tablet Take 40 mg by mouth as needed.     sulfamethoxazole -trimethoprim  (BACTRIM  DS) 800-160 MG tablet Take 1 tablet by mouth 2 (two) times daily.     tadalafil (CIALIS) 20 MG tablet Take 20 mg by mouth daily as needed for  erectile dysfunction.     triamcinolone  cream (KENALOG ) 0.1 % 1 Application as needed (skin irritation).     nicotine  (NICODERM CQ  - DOSED IN MG/24 HOURS) 14 mg/24hr patch Place 1 patch (14 mg total) onto the skin daily. 28 patch 2    Past Medical History:  Diagnosis Date   Elevated PSA    GERD (gastroesophageal reflux disease)    Headache    sinus related   Hypertension    Nocturia    Prostate cancer (HCC)    Wears glasses     Past Surgical History:  Procedure Laterality Date   HERNIA REPAIR Right    LYMPHADENECTOMY Bilateral 11/14/2021   Procedure: LYMPHADENECTOMY, PELVIC;  Surgeon: Renda Glance, MD;  Location: [facility];  Service: Urology;  Laterality: Bilateral;   PROSTATE BIOPSY N/A 08/29/2017   Procedure: BIOPSY TRANSRECTAL ULTRASONIC PROSTATE (TUBP);  Surgeon: Devere Lonni Righter, MD;  Location: [facility];  Service: Urology;  Laterality: N/A;   ROBOT ASSISTED LAPAROSCOPIC RADICAL PROSTATECTOMY N/A 11/14/2021   Procedure: XI ROBOTIC ASSISTED LAPAROSCOPIC RADICAL PROSTATECTOMY LEVEL 1;  Surgeon: Renda Glance, MD;  Location: [facility];  Service: Urology;  Laterality: N/A;    Social History   Tobacco Use   Smoking status: Every Day    Current packs/day: 0.50    Average packs/day: 0.5 packs/day for 1 year (0.5 ttl pk-yrs)    Types: Cigarettes   Smokeless tobacco: Never   Tobacco comments:    0.5 PP2D  Vaping Use   Vaping status: Never Used  Substance Use Topics   Alcohol use: Not Currently    Comment: OCCASIONAL   Drug use: No    Family History  Problem Relation Age of Onset   Cancer Father        colon cancer   Cancer Brother        prostate   Cancer Brother        prostate   Cancer Cousin        prostate cancer/maternal first cousin    PE: Vitals:   08/23/24 0600 08/23/24 0615 08/23/24 0700 08/23/24 1055  BP: (!) 152/104 (!) 153/79 (!) 150/105 (!) 159/76  Pulse: 68 69 69 73  Resp: 18 18 16 20   Temp:  98.4 F (36.9 C)  98.5 F  (36.9 C)  TempSrc:  Oral    SpO2: 100% 97% 100% 100%   Patient appears to be in no acute distress  patient is alert and oriented x3 Atraumatic normocephalic head No cervical or supraclavicular lymphadenopathy appreciated No increased work of breathing, no audible wheezes/rhonchi Regular sinus rhythm/rate Abdomen is soft, nontender, nondistended Lower extremities are symmetric without appreciable edema Grossly neurologically intact No identifiable skin lesions  Recent Labs    08/22/24 1836 08/23/24 0837  WBC 9.9 8.7  HGB 4.7* 7.5*  HCT 16.3* 25.1*   Recent Labs    08/22/24 1836 08/23/24 0837  NA 133* 134*  K 4.6 4.5  CL 97* 102  CO2 20* 20*  GLUCOSE 117* 94  BUN 21 15  CREATININE 1.28* 1.23  CALCIUM 9.5 9.0   No results for input(s): LABPT, INR in the last 72 hours. No results for input(s): LABURIN in the last 72 hours. Results for orders placed or performed during the hospital encounter of 02/19/24  Urine Culture     Status: None   Collection Time: 02/19/24 12:26 PM   Specimen: Urine, Clean Catch  Result Value Ref Range Status   Specimen Description URINE, CLEAN CATCH  Final   Special Requests NONE  Final   Culture   Final    NO GROWTH Performed at [facility], [address] [place], [place] [address]    Report Status 02/20/2024 FINAL  Final    Imaging: I did review the patient's CT scan.  This demonstrated a decompressed bladder.  There is no hydroureteronephrosis.  I discussed these findings with the patient.  Imp/Recommendations: Refractory hemorrhagic radiation cystitis.  He has acute blood loss anemia as a result.  He has been transfused and his hemoglobin appears to have responded.  The Foley catheter initially occluded, and upon removal he was able to void a large clot.  Urine behind the clot was only pink-tinged.  As a result, I recommended that they hold off on the catheter replacement and perform a string of bottles for rack his urine  to see if we can ascertain whether or not the bleeding is improving.  Will continue TXA 3 times daily.  The patient's hemoglobin is stable this afternoon his urine is clearing, he would like to go home.  This is ultimately up to the hospitalist, but would be reasonable from my perspective.  If the patient is still here tomorrow, I will check on him.     Thank you for involving me in this patient's care, I will continue to follow along  Morene LELON Salines
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
## 2024-08-24 DIAGNOSIS — N3091 Cystitis, unspecified with hematuria: Secondary | ICD-10-CM | POA: Diagnosis not present

## 2024-08-24 DIAGNOSIS — Z720 Tobacco use: Secondary | ICD-10-CM | POA: Diagnosis not present

## 2024-08-24 DIAGNOSIS — R311 Benign essential microscopic hematuria: Secondary | ICD-10-CM | POA: Diagnosis not present

## 2024-08-24 DIAGNOSIS — D62 Acute posthemorrhagic anemia: Secondary | ICD-10-CM | POA: Diagnosis not present

## 2024-08-24 LAB — CBC
HCT: 26.5 % — ABNORMAL LOW (ref 39.0–52.0)
Hemoglobin: 8.1 g/dL — ABNORMAL LOW (ref 13.0–17.0)
MCH: 24.9 pg — ABNORMAL LOW (ref 26.0–34.0)
MCHC: 30.6 g/dL (ref 30.0–36.0)
MCV: 81.5 fL (ref 80.0–100.0)
Platelets: 359 K/uL (ref 150–400)
RBC: 3.25 MIL/uL — ABNORMAL LOW (ref 4.22–5.81)
RDW: 16.9 % — ABNORMAL HIGH (ref 11.5–15.5)
WBC: 8 K/uL (ref 4.0–10.5)
nRBC: 0 % (ref 0.0–0.2)

## 2024-08-24 LAB — COMPREHENSIVE METABOLIC PANEL WITH GFR
ALT: 15 U/L (ref 0–44)
AST: 16 U/L (ref 15–41)
Albumin: 3.9 g/dL (ref 3.5–5.0)
Alkaline Phosphatase: 66 U/L (ref 38–126)
Anion gap: 13 (ref 5–15)
BUN: 13 mg/dL (ref 8–23)
CO2: 21 mmol/L — ABNORMAL LOW (ref 22–32)
Calcium: 8.9 mg/dL (ref 8.9–10.3)
Chloride: 103 mmol/L (ref 98–111)
Creatinine, Ser: 1.18 mg/dL (ref 0.61–1.24)
GFR, Estimated: >60 mL/min (ref >60)
Glucose, Bld: 101 mg/dL — ABNORMAL HIGH (ref 70–99)
Potassium: 4.2 mmol/L (ref 3.5–5.1)
Sodium: 136 mmol/L (ref 135–145)
Total Bilirubin: 0.5 mg/dL (ref 0.0–1.2)
Total Protein: 5.8 g/dL — ABNORMAL LOW (ref 6.5–8.1)

## 2024-08-24 LAB — CBC WITH DIFFERENTIAL/PLATELET
Abs Immature Granulocytes: 0.05 K/uL (ref 0.00–0.07)
Basophils Absolute: 0.1 K/uL (ref 0.0–0.1)
Basophils Relative: 1 %
Eosinophils Absolute: 0.2 K/uL (ref 0.0–0.5)
Eosinophils Relative: 3 %
HCT: 25.2 % — ABNORMAL LOW (ref 39.0–52.0)
Hemoglobin: 7.6 g/dL — ABNORMAL LOW (ref 13.0–17.0)
Immature Granulocytes: 1 %
Lymphocytes Relative: 12 %
Lymphs Abs: 1 K/uL (ref 0.7–4.0)
MCH: 24.7 pg — ABNORMAL LOW (ref 26.0–34.0)
MCHC: 30.2 g/dL (ref 30.0–36.0)
MCV: 81.8 fL (ref 80.0–100.0)
Monocytes Absolute: 0.7 K/uL (ref 0.1–1.0)
Monocytes Relative: 8 %
Neutro Abs: 6.2 K/uL (ref 1.7–7.7)
Neutrophils Relative %: 75 %
Platelets: 353 K/uL (ref 150–400)
RBC: 3.08 MIL/uL — ABNORMAL LOW (ref 4.22–5.81)
RDW: 17.1 % — ABNORMAL HIGH (ref 11.5–15.5)
WBC: 8.1 K/uL (ref 4.0–10.5)
nRBC: 0.2 % (ref 0.0–0.2)

## 2024-08-24 LAB — MAGNESIUM: Magnesium: 2.5 mg/dL — ABNORMAL HIGH (ref 1.7–2.4)

## 2024-08-24 LAB — PHOSPHORUS: Phosphorus: 3.4 mg/dL (ref 2.5–4.6)

## 2024-08-24 MED ORDER — ONDANSETRON HCL 4 MG PO TABS: 4.0000 mg | ORAL_TABLET | Freq: Four times a day (QID) | ORAL | 0 refills | Status: DC | PRN

## 2024-08-24 MED ORDER — GUAIFENESIN-DM 100-10 MG/5ML PO SYRP
5.0000 mL | ORAL_SOLUTION | ORAL | Status: DC | PRN
  Administered 2024-08-24: 5 mL via ORAL
  Filled 2024-08-24: qty 10

## 2024-08-24 MED ORDER — MENTHOL 3 MG MT LOZG
1.0000 | LOZENGE | OROMUCOSAL | Status: DC | PRN
Start: 2024-08-24 — End: 2024-08-24
  Administered 2024-08-24: 3 mg via ORAL
  Filled 2024-08-24: qty 9

## 2024-08-24 NOTE — Progress Notes (Signed)
 Urine output monitoring with string of bottles completed. Urine color remained clear, bright red, with 1-2 clots ranging from dime-quarter size.
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
## 2024-08-24 NOTE — Consult Note (Signed)
 Urology Inpatient Progress Report  Acute blood loss anemia [D62] Gross hematuria [R31.0] Symptomatic anemia [D64.9] Acute blood loss anemia (ABLA) [D62]     Intv/Subj: No acute events overnight. Patient is without complaint. Voiding well without catheter, urine has remained pink/blood-tinged, hemoglobin stable  Principal Problem:   Acute blood loss anemia (ABLA) Active Problems:   Tobacco abuse   Hematuria  Current Facility-Administered Medications  Medication Dose Route Frequency Provider Last Rate Last Admin   acetaminophen  (TYLENOL ) tablet 650 mg  650 mg Oral Q6H PRN Arthea Child, MD       Or   acetaminophen  (TYLENOL ) suppository 650 mg  650 mg Rectal Q6H PRN Arthea Child, MD       bisacodyl  (DULCOLAX) EC tablet 5 mg  5 mg Oral Daily PRN Arthea Child, MD       ferrous sulfate  tablet 325 mg  325 mg Oral Daily Sheikh, Omair Latif, DO   325 mg at 08/24/24 1046   guaiFENesin -dextromethorphan  (ROBITUSSIN DM) 100-10 MG/5ML syrup 5 mL  5 mL Oral Q4H PRN Sheikh, Omair Latif, DO   5 mL at 08/24/24 0146   menthol  (CEPACOL) lozenge 3 mg  1 lozenge Oral PRN Sheikh, Omair Latif, DO   3 mg at 08/24/24 9378   metoprolol  succinate (TOPROL -XL) 24 hr tablet 150 mg  150 mg Oral Daily Sheikh, Omair Latif, DO   150 mg at 08/24/24 1046   nicotine  (NICODERM CQ  - dosed in mg/24 hours) patch 14 mg  14 mg Transdermal Daily Sheikh, Omair [place], DO   14 mg at 08/24/24 1047   ondansetron  (ZOFRAN ) tablet 4 mg  4 mg Oral Q6H PRN Arthea Child, MD       Or   ondansetron  (ZOFRAN ) injection 4 mg  4 mg Intravenous Q6H PRN Arthea Child, MD       pantoprazole  (PROTONIX ) EC tablet 40 mg  40 mg Oral PRN Sheikh, Omair Latif, DO   40 mg at 08/23/24 2224   sodium chloride  irrigation 0.9 % 3,000 mL  3,000 mL Irrigation Continuous Sheikh, Omair Latif, DO   Held at 08/23/24 1251   tranexamic acid  (LYSTEDA ) tablet 1,300 mg  1,300 mg Oral TID Sherrill Cable [place], DO   1,300 mg at 08/24/24  1046     Objective: Vital: Vitals:   08/24/24 0348 08/24/24 0400 08/24/24 0542 08/24/24 0615  BP: (!) 169/80 (!) 162/88 135/77 (!) 156/79  Pulse: 74  71 77  Resp:   20 (!) 22  Temp: 98 F (36.7 C)  97.6 F (36.4 C) 98.7 F (37.1 C)  TempSrc: Oral   Oral  SpO2:   100% 100%  Weight:  89.7 kg    Height:       I/Os: I/O last 3 completed shifts: In: 1421.8 [P.O.:240; I.V.:86.8; Blood:995; IV Piggyback:100] Out: 2500 [Urine:2500]  Physical Exam:  General: Patient is in no apparent distress Lungs: Normal respiratory effort, chest expands symmetrically. The abdomen is soft and nontender without mass. Ext: lower extremities symmetric Urine has cleared slightly over the course of the last 24 hours, remains blood-tinged  Lab Results: Recent Labs    08/23/24 1924 08/24/24 0245 08/24/24 0743  WBC 9.2 8.1 8.0  HGB 8.0* 7.6* 8.1*  HCT 26.2* 25.2* 26.5*   Recent Labs    08/22/24 1836 08/23/24 0837 08/24/24 0245  NA 133* 134* 136  K 4.6 4.5 4.2  CL 97* 102 103  CO2 20* 20* 21*  GLUCOSE 117* 94 101*  BUN 21 15 13  CREATININE 1.28* 1.23 1.18  CALCIUM 9.5 9.0 8.9   No results for input(s): LABPT, INR in the last 72 hours. No results for input(s): LABURIN in the last 72 hours. Results for orders placed or performed during the hospital encounter of 02/19/24  Urine Culture     Status: None   Collection Time: 02/19/24 12:26 PM   Specimen: Urine, Clean Catch  Result Value Ref Range Status   Specimen Description URINE, CLEAN CATCH  Final   Special Requests NONE  Final   Culture   Final    NO GROWTH Performed at [facility], [address] [place], [place] [address]    Report Status 02/20/2024 FINAL  Final    Studies/Results: CT ABDOMEN PELVIS W CONTRAST Result Date: 08/22/2024 EXAM: CT ABDOMEN AND PELVIS WITH CONTRAST 08/22/2024 09:22:57 PM TECHNIQUE: CT of the abdomen and pelvis was performed with the administration of intravenous contrast. Multiplanar  reformatted images are provided for review. Automated exposure control, iterative reconstruction, and/or weight-based adjustment of the mA/kV was utilized to reduce the radiation dose to as low as reasonably achievable. COMPARISON: 08/04/2024 CLINICAL HISTORY: Hematuria, gross/macroscopic. FINDINGS: LOWER CHEST: No acute abnormality. LIVER: The liver is unremarkable. GALLBLADDER AND BILE DUCTS: Gallbladder is decompressed. No biliary ductal dilatation. SPLEEN: No acute abnormality. PANCREAS: No acute abnormality. ADRENAL GLANDS: No acute abnormality. KIDNEYS, URETERS AND BLADDER: No stones in the kidneys or ureters. No hydronephrosis. No perinephric or periureteral stranding. Mildly thick walled/irregular bladder with indwelling Foley catheter. GI AND BOWEL: Normal appendix (image 56). Stomach demonstrates no acute abnormality. There is no bowel obstruction. PERITONEUM AND RETROPERITONEUM: No ascites. No free air. Small mildly complex fat-containing midline supraumbilical hernia (sagittal image 107), without associated inflammatory changes. VASCULATURE: Atherosclerotic calcifications of the abdominal aorta and branch vessels. LYMPH NODES: No lymphadenopathy. REPRODUCTIVE ORGANS: Suspected prior prostatectomy. BONES AND SOFT TISSUES: Mild degenerative changes of the visualized thoracolumbar spine. No focal soft tissue abnormality. IMPRESSION: 1. Mildly thick walled/irregular bladder with indwelling Foley catheter. While this appearance may be related to radiation changes, primary bladder tumor is not excluded. 2. Suspected prior prostatectomy. No findings suspicious for metastatic disease. Electronically signed by: Pinkie Pebbles MD 08/22/2024 09:27 PM EDT RP Workstation: [id number]    Assessment: The patient is voiding well on his own, the urine is clearing slightly, but his hemoglobin is stable.  Plan: At this point I think it is reasonable for the patient to be discharged home.  He is scheduled for ear  tubes tomorrow so that he can continue with hyperbaric oxygen .  He has scheduled follow-up with Dr. Renda next week.   Morene Salines, MD Urology 08/24/2024, 11:35 AM

## 2024-08-25 LAB — TYPE AND SCREEN
ABO/RH(D): O POS
Antibody Screen: NEGATIVE
Unit division: 0
Unit division: 0
Unit division: 0

## 2024-08-25 LAB — BPAM RBC
Blood Product Expiration Date: 202510212359
Blood Product Expiration Date: 202510212359
Blood Product Expiration Date: 202510212359
ISSUE DATE / TIME: 202509192102
ISSUE DATE / TIME: 202509200145
ISSUE DATE / TIME: 202509200409
Unit Type and Rh: 5100
Unit Type and Rh: 5100
Unit Type and Rh: 5100

## 2024-08-26 ENCOUNTER — Telehealth (HOSPITAL_BASED_OUTPATIENT_CLINIC_OR_DEPARTMENT_OTHER): Payer: Self-pay

## 2024-08-26 ENCOUNTER — Encounter (HOSPITAL_BASED_OUTPATIENT_CLINIC_OR_DEPARTMENT_OTHER): Admitting: Internal Medicine

## 2024-08-26 NOTE — Telephone Encounter (Addendum)
 Received follow up call (approximately 1200) from [facility] with [facility], [facility], that patient is cleared for HBOT since visit yesterday 08/25/2024. Drainage from ears after tube placement is normal and patient can start HBOT as early as today, 08/26/2024

## 2024-08-26 NOTE — Telephone Encounter (Signed)
 Patient called at 0744 on 08/26/2024 to followup on VM left for patient on his phone. During call He mentioned drainage from ears after having tympanostomy tubes placed. I told him I would speak to Dr. Rosan to see if he needed to take some decongestants. Dr. Rosan had me relay to patient that he needs to follow up with ENT and will resume hyperbaric oxygen  treatments upon clearance from ENT.

## 2024-08-26 NOTE — Telephone Encounter (Signed)
 The pt had tubes placed on 08/25/24. Pt came into the office on 08/26/24 stating his right ear was draining. An RX for drops was called into his pharmacy.  Pt called into the office while at the pharmacy and stated he needed to be cleared for treatment by the provider.  Dr. Jesus advised me to contact [facility] and inform Dr. Harlene Gretel [facility] that the patient was cleared for treatment on 08/25/24 by Dr. Jesus. The phone number called was [phone]

## 2024-08-27 ENCOUNTER — Ambulatory Visit (HOSPITAL_BASED_OUTPATIENT_CLINIC_OR_DEPARTMENT_OTHER): Admitting: Internal Medicine

## 2024-08-27 DIAGNOSIS — N3041 Irradiation cystitis with hematuria: Secondary | ICD-10-CM

## 2024-08-27 DIAGNOSIS — C61 Malignant neoplasm of prostate: Secondary | ICD-10-CM

## 2024-08-28 ENCOUNTER — Encounter (HOSPITAL_BASED_OUTPATIENT_CLINIC_OR_DEPARTMENT_OTHER): Admitting: Internal Medicine

## 2024-08-28 DIAGNOSIS — N3041 Irradiation cystitis with hematuria: Secondary | ICD-10-CM

## 2024-08-28 DIAGNOSIS — C61 Malignant neoplasm of prostate: Secondary | ICD-10-CM | POA: Diagnosis not present

## 2024-08-29 ENCOUNTER — Emergency Department (HOSPITAL_COMMUNITY)
Admission: EM | Admit: 2024-08-29 | Discharge: 2024-08-29 | Disposition: A | Discharge status: Home / Self Care | Source: Home / Self Care | Attending: Emergency Medicine | Admitting: Emergency Medicine

## 2024-08-29 ENCOUNTER — Ambulatory Visit: Payer: Self-pay | Admitting: Student in an Organized Health Care Education/Training Program

## 2024-08-29 ENCOUNTER — Encounter (HOSPITAL_BASED_OUTPATIENT_CLINIC_OR_DEPARTMENT_OTHER): Admitting: Internal Medicine

## 2024-08-29 ENCOUNTER — Other Ambulatory Visit: Payer: Self-pay

## 2024-08-29 ENCOUNTER — Emergency Department (HOSPITAL_COMMUNITY)
Admission: EM | Admit: 2024-08-29 | Discharge: 2024-08-29 | Disposition: A | Discharge status: Home / Self Care | Attending: Emergency Medicine | Admitting: Emergency Medicine

## 2024-08-29 ENCOUNTER — Ambulatory Visit (HOSPITAL_COMMUNITY): Admission: RE | Admit: 2024-08-29 | Source: Ambulatory Visit

## 2024-08-29 DIAGNOSIS — D72829 Elevated white blood cell count, unspecified: Secondary | ICD-10-CM | POA: Insufficient documentation

## 2024-08-29 DIAGNOSIS — R339 Retention of urine, unspecified: Secondary | ICD-10-CM | POA: Insufficient documentation

## 2024-08-29 DIAGNOSIS — Z79899 Other long term (current) drug therapy: Secondary | ICD-10-CM | POA: Insufficient documentation

## 2024-08-29 DIAGNOSIS — I10 Essential (primary) hypertension: Secondary | ICD-10-CM | POA: Insufficient documentation

## 2024-08-29 DIAGNOSIS — Z8546 Personal history of malignant neoplasm of prostate: Secondary | ICD-10-CM | POA: Insufficient documentation

## 2024-08-29 LAB — CBC WITH DIFFERENTIAL/PLATELET
Abs Immature Granulocytes: 0.06 K/uL (ref 0.00–0.07)
Basophils Absolute: 0.1 K/uL (ref 0.0–0.1)
Basophils Relative: 0 %
Eosinophils Absolute: 0.1 K/uL (ref 0.0–0.5)
Eosinophils Relative: 1 %
HCT: 26.3 % — ABNORMAL LOW (ref 39.0–52.0)
Hemoglobin: 7.9 g/dL — ABNORMAL LOW (ref 13.0–17.0)
Immature Granulocytes: 1 %
Lymphocytes Relative: 6 %
Lymphs Abs: 0.8 K/uL (ref 0.7–4.0)
MCH: 24.7 pg — ABNORMAL LOW (ref 26.0–34.0)
MCHC: 30 g/dL (ref 30.0–36.0)
MCV: 82.2 fL (ref 80.0–100.0)
Monocytes Absolute: 0.8 K/uL (ref 0.1–1.0)
Monocytes Relative: 7 %
Neutro Abs: 10.9 K/uL — ABNORMAL HIGH (ref 1.7–7.7)
Neutrophils Relative %: 85 %
Platelets: 342 K/uL (ref 150–400)
RBC: 3.2 MIL/uL — ABNORMAL LOW (ref 4.22–5.81)
RDW: 17.3 % — ABNORMAL HIGH (ref 11.5–15.5)
WBC: 12.7 K/uL — ABNORMAL HIGH (ref 4.0–10.5)
nRBC: 0 % (ref 0.0–0.2)

## 2024-08-29 LAB — URINALYSIS, ROUTINE W REFLEX MICROSCOPIC
Bilirubin Urine: NEGATIVE
Glucose, UA: 50 mg/dL — AB
Ketones, ur: NEGATIVE mg/dL
Leukocytes,Ua: NEGATIVE
Nitrite: NEGATIVE
Protein, ur: 100 mg/dL — AB
Specific Gravity, Urine: 1.005 (ref 1.005–1.030)
pH: 7 (ref 5.0–8.0)

## 2024-08-29 LAB — LIPID PANEL
Chol/HDL Ratio: 3.5 ratio (ref 0.0–5.0)
Cholesterol, Total: 146 mg/dL (ref 100–199)
HDL: 42 mg/dL (ref >39)
LDL Chol Calc (NIH): 77 mg/dL (ref 0–99)
Triglycerides: 156 mg/dL — ABNORMAL HIGH (ref 0–149)
VLDL Cholesterol Cal: 27 mg/dL (ref 5–40)

## 2024-08-29 LAB — URINALYSIS, MICROSCOPIC (REFLEX): RBC / HPF: >50 RBC/hpf (ref 0–5)

## 2024-08-29 LAB — BASIC METABOLIC PANEL WITH GFR
Anion gap: 14 (ref 5–15)
BUN: 14 mg/dL (ref 8–23)
CO2: 18 mmol/L — ABNORMAL LOW (ref 22–32)
Calcium: 10.1 mg/dL (ref 8.9–10.3)
Chloride: 97 mmol/L — ABNORMAL LOW (ref 98–111)
Creatinine, Ser: 1.21 mg/dL (ref 0.61–1.24)
GFR, Estimated: >60 mL/min (ref >60)
Glucose, Bld: 123 mg/dL — ABNORMAL HIGH (ref 70–99)
Potassium: 4.6 mmol/L (ref 3.5–5.1)
Sodium: 129 mmol/L — ABNORMAL LOW (ref 135–145)

## 2024-08-29 LAB — LIPOPROTEIN A (LPA)

## 2024-08-29 MED ORDER — LIDOCAINE 4 % EX GEL: CUTANEOUS | 0 refills | Status: DC

## 2024-08-29 MED ORDER — LIDOCAINE HCL URETHRAL/MUCOSAL 2 % EX GEL
1.0000 | Freq: Once | CUTANEOUS | Status: AC
  Administered 2024-08-29: 1 via TOPICAL
  Filled 2024-08-29: qty 11

## 2024-08-29 NOTE — Discharge Instructions (Signed)
 Your catheter was exchanged this morning allowing the resumption of urination.  I did prescribe please keep your scheduled appointments with urology for further evaluation.  If you have further difficulty with urination please return to the emergency department.

## 2024-08-29 NOTE — ED Triage Notes (Signed)
 Pt had urinary cath placed Thursday at urology office. Since has had blood urinary output with clots. Had irrigated the catheter twice at home. Reports concern for burning pain and ongoing urinary retention.

## 2024-08-29 NOTE — ED Provider Notes (Signed)
 [place] EMERGENCY DEPARTMENT AT [facility] Provider Note   CSN: [id number] Arrival date & time: 08/29/24  9384     Patient presents with: Urinary Retention   Erik Gill is a 63 y.o. male with history of prostatectomy, radiation presents with urinary retention.  Was evaluated last night for similar complaints.  The day prior he was seen by urology and a Foley was placed.  Last night his Foley was flushed, with persistent symptoms Foley was exchanged.  States he was home for few hours when he started having persistent urinary retention.  No bleeding.  Denies any fevers or chills, nausea or vomiting.   HPI    Past Medical History:  Diagnosis Date   Elevated PSA    GERD (gastroesophageal reflux disease)    Headache    sinus related   Hypertension    Nocturia    Prostate cancer (HCC)    Wears glasses    Past Surgical History:  Procedure Laterality Date   HERNIA REPAIR Right    LYMPHADENECTOMY Bilateral 11/14/2021   Procedure: LYMPHADENECTOMY, PELVIC;  Surgeon: Renda Glance, MD;  Location: [facility];  Service: Urology;  Laterality: Bilateral;   PROSTATE BIOPSY N/A 08/29/2017   Procedure: BIOPSY TRANSRECTAL ULTRASONIC PROSTATE (TUBP);  Surgeon: Devere Lonni Righter, MD;  Location: [facility];  Service: Urology;  Laterality: N/A;   ROBOT ASSISTED LAPAROSCOPIC RADICAL PROSTATECTOMY N/A 11/14/2021   Procedure: XI ROBOTIC ASSISTED LAPAROSCOPIC RADICAL PROSTATECTOMY LEVEL 1;  Surgeon: Renda Glance, MD;  Location: [facility];  Service: Urology;  Laterality: N/A;     Prior to Admission medications   Medication Sig Start Date End Date Taking? Authorizing Provider  acetaminophen  (TYLENOL ) 650 MG CR tablet Take 1,300 mg by mouth every 8 (eight) hours as needed for pain.    [provider]  albuterol  (VENTOLIN  HFA) 108 (90 Base) MCG/ACT inhaler Inhale 2 puffs into the lungs every 4 (four) hours as needed for wheezing or shortness of breath. 06/20/24    Arloa Suzen RAMAN, NP  amLODipine -benazepril  (LOTREL) 10-40 MG capsule Take 1 capsule by mouth in the morning. 09/28/16   [provider]  cetirizine (ZYRTEC ALLERGY) 10 MG tablet Take 10 mg by mouth in the morning.    [provider]  clotrimazole -betamethasone  (LOTRISONE ) cream Apply to affected area 2 times daily prn 08/22/23   Billy Asberry FALCON, PA-C  ferrous sulfate  325 (65 FE) MG tablet Take 1 tablet (325 mg total) by mouth daily. 04/11/24 08/22/24  Cottie Donnice PARAS, MD  fluticasone  (FLONASE ) 50 MCG/ACT nasal spray Place 1 spray into both nostrils daily for 3 days. Patient taking differently: Place 1 spray into both nostrils in the morning. 05/19/22 08/22/24  Hazen Darryle BRAVO, FNP  Lidocaine  4 % GEL Apply small amount to urethral opening twice daily as needed for irritation from foley catheter 08/29/24   Logan Ubaldo B, PA-C  metoprolol  succinate (TOPROL -XL) 100 MG 24 hr tablet Take 100 mg by mouth in the morning. Patient taking differently: Take 150 mg by mouth in the morning. 08/21/22   [provider]  Naphazoline-Pheniramine (OPCON-A ) 0.027-0.315 % SOLN Place 1 drop into both eyes daily as needed (allergies).    [provider]  nicotine  (NICODERM CQ  - DOSED IN MG/24 HOURS) 14 mg/24hr patch Place 1 patch (14 mg total) onto the skin daily. 08/20/24   Floretta Mallard, MD  ondansetron  (ZOFRAN ) 4 MG tablet Take 1 tablet (4 mg total) by mouth every 6 (six) hours as needed for  nausea. 08/24/24   Sherrill Cable Latif, DO  pantoprazole  (PROTONIX ) 40 MG tablet Take 40 mg by mouth as needed. 04/21/24   [provider]  sulfamethoxazole -trimethoprim  (BACTRIM  DS) 800-160 MG tablet Take 1 tablet by mouth 2 (two) times daily. 08/11/24   [provider]  tadalafil (CIALIS) 20 MG tablet Take 20 mg by mouth daily as needed for erectile dysfunction. 09/27/15   [provider]  triamcinolone  cream (KENALOG ) 0.1 % 1 Application as needed (skin irritation).     [provider]    Allergies: Other    Review of Systems  Genitourinary:  Positive for difficulty urinating.    Updated Vital Signs BP 137/63   Pulse 72   Temp 97.8 F (36.6 C) (Oral)   Resp 18   SpO2 98%   Physical Exam Vitals and nursing note reviewed.  Constitutional:      General: He is not in acute distress.    Appearance: He is well-developed.  HENT:     Head: Normocephalic and atraumatic.  Eyes:     Conjunctiva/sclera: Conjunctivae normal.  Cardiovascular:     Rate and Rhythm: Normal rate and regular rhythm.     Heart sounds: No murmur heard. Pulmonary:     Effort: Pulmonary effort is normal. No respiratory distress.     Breath sounds: Normal breath sounds.  Abdominal:     Palpations: Abdomen is soft.     Tenderness: There is no abdominal tenderness.  Musculoskeletal:        General: No swelling.     Cervical back: Neck supple.  Skin:    General: Skin is warm and dry.     Capillary Refill: Capillary refill takes less than 2 seconds.  Neurological:     Mental Status: He is alert.  Psychiatric:        Mood and Affect: Mood normal.     (all labs ordered are listed, but only abnormal results are displayed) Labs Reviewed  URINALYSIS, ROUTINE W REFLEX MICROSCOPIC - Abnormal; Notable for the following components:      Result Value   Color, Urine RED (*)    APPearance TURBID (*)    Glucose, UA 50 (*)    Hgb urine dipstick MODERATE (*)    Protein, ur 100 (*)    All other components within normal limits    EKG: None  Radiology: No results found.   Procedures   Medications Ordered in the ED - No data to display  Clinical Course as of 08/29/24 0936  [facility] Aug 29, 2024  9261 Patient with history of prostatectomy with current indwelling Foley evaluated for complaints of persistent urinary retention.  Found to have 3 L on bladder scan.  He is tachycardic upon arrival.  He is otherwise hemodynamically stable.  Abdomen is nontender.  Will begin  with flushing his Foley. [JT]  [id number] Reviewed lab work from last night.  UA is without evidence of UTI.  BMP without evidence of AKI.  Does have a mild leukocytosis of 12.7. [JT]  [id number] Foley was flushed with output of 1300 cc of bloody urine.  Endorse some associated dysuria. Will check UA. [JT]  0919 Urinalysis, Routine w reflex microscopic -Urine, Catheterized(!) Moderate hemoglobin, negative nitrites, negative leukocytes.  No evidence of overall UTI. [JT]  [id number] Recommended to patient three-way catheter given hematuria with clots and intermittent urinary retention concerning for catheter obstruction. Ole NP with urology in agreement with plan.  Patient however, has no interest in three-way catheter at  this time.  He is requesting discharge with current catheter in place.  He is understanding of the risks for repeat urinary retention.  He will follow-up with his urology team within the next week.  If he has acute recurrent retention before then he will return to the emergency room [JT]    Clinical Course User Index [JT] Donnajean Lynwood DEL, PA-C                                 Medical Decision Making  This patient presents to the ED with chief complaint(s) of Urinary retention.  The complaint involves an extensive differential diagnosis and also carries with it a high risk of complications and morbidity.   Pertinent past medical history as listed in HPI   Additional history obtained: Records reviewed Care Everywhere/External Records  Disposition:   Patient will be discharged home. The patient has been appropriately medically screened and/or stabilized in the ED. I have low suspicion for any other emergent medical condition which would require further screening, evaluation or treatment in the ED or require inpatient management. At time of discharge the patient is hemodynamically stable and in no acute distress. I have discussed work-up results and diagnosis with patient and answered all  questions. Patient is agreeable with discharge plan. We discussed strict return precautions for returning to the emergency department and they verbalized understanding.     Social Determinants of Health:   none  This note was dictated with voice recognition software.  Despite best efforts at proofreading, errors may have occurred which can change the documentation meaning.       Final diagnoses:  Urinary retention    ED Discharge Orders     None          Donnajean Lynwood DEL DEVONNA 08/29/24 9063    Trine Raynell Moder, MD 08/30/24 [phone]

## 2024-08-29 NOTE — ED Notes (Signed)
Pt given leg bag

## 2024-08-29 NOTE — ED Provider Notes (Signed)
 Erik Gill Provider Note   CSN: [id number] Arrival date & time: 08/29/24  9941     Patient presents with: Urinary Retention   Erik Gill is a 63 y.o. male.  Patient with past medical history significant for prostatectomy, radiation treatment presents the emergency department complaining of urinary retention.  He states he was urinating normally earlier today.  He had a hyperbaric treatment for his radiation-induced hematuria this afternoon.  Shortly thereafter he began to have difficulty with urination.  He was seen at the urology office who placed a catheter.  He reports the urologist felt that scar tissue was causing his problems.  Since going home he has had some difficulty urinating.  His catheter has been flushed at home multiple times with only a small amount of return of urine.  His last known output was at approximately 7 PM.  He denies nausea, vomiting, other systemic complaints at this time.   HPI     Prior to Admission medications   Medication Sig Start Date End Date Taking? Authorizing Provider  Lidocaine  4 % GEL Apply small amount to urethral opening twice daily as needed for irritation from foley catheter 08/29/24  Yes Kee, Drudge, PA-C  acetaminophen  (TYLENOL ) 650 MG CR tablet Take 1,300 mg by mouth every 8 (eight) hours as needed for pain.    [provider]  albuterol  (VENTOLIN  HFA) 108 (90 Base) MCG/ACT inhaler Inhale 2 puffs into the lungs every 4 (four) hours as needed for wheezing or shortness of breath. 06/20/24   Arloa Suzen RAMAN, NP  amLODipine -benazepril  (LOTREL) 10-40 MG capsule Take 1 capsule by mouth in the morning. 09/28/16   [provider]  cetirizine (ZYRTEC ALLERGY) 10 MG tablet Take 10 mg by mouth in the morning.    [provider]  clotrimazole -betamethasone  (LOTRISONE ) cream Apply to affected area 2 times daily prn 08/22/23   Billy Asberry FALCON, PA-C  ferrous sulfate  325 (65  FE) MG tablet Take 1 tablet (325 mg total) by mouth daily. 04/11/24 08/22/24  Cottie Donnice PARAS, MD  fluticasone  (FLONASE ) 50 MCG/ACT nasal spray Place 1 spray into both nostrils daily for 3 days. Patient taking differently: Place 1 spray into both nostrils in the morning. 05/19/22 08/22/24  Hazen Darryle BRAVO, FNP  metoprolol  succinate (TOPROL -XL) 100 MG 24 hr tablet Take 100 mg by mouth in the morning. Patient taking differently: Take 150 mg by mouth in the morning. 08/21/22   [provider]  Naphazoline-Pheniramine (OPCON-A ) 0.027-0.315 % SOLN Place 1 drop into both eyes daily as needed (allergies).    [provider]  nicotine  (NICODERM CQ  - DOSED IN MG/24 HOURS) 14 mg/24hr patch Place 1 patch (14 mg total) onto the skin daily. 08/20/24   Floretta Mallard, MD  ondansetron  (ZOFRAN ) 4 MG tablet Take 1 tablet (4 mg total) by mouth every 6 (six) hours as needed for nausea. 08/24/24   Sherrill Cable Latif, DO  pantoprazole  (PROTONIX ) 40 MG tablet Take 40 mg by mouth as needed. 04/21/24   [provider]  sulfamethoxazole -trimethoprim  (BACTRIM  DS) 800-160 MG tablet Take 1 tablet by mouth 2 (two) times daily. 08/11/24   [provider]  tadalafil (CIALIS) 20 MG tablet Take 20 mg by mouth daily as needed for erectile dysfunction. 09/27/15   [provider]  triamcinolone  cream (KENALOG ) 0.1 % 1 Application as needed (skin irritation).    [provider]    Allergies: Other    Review of Systems  Updated Vital Signs BP (!) 174/83   Pulse 75   Temp 97.8 F (36.6 C)   Resp 17   SpO2 100%   Physical Exam Vitals and nursing note reviewed.  HENT:     Head: Normocephalic and atraumatic.  Eyes:     Pupils: Pupils are equal, round, and reactive to light.  Pulmonary:     Effort: Pulmonary effort is normal. No respiratory distress.  Abdominal:     Comments: Suprapubic abdominal tenderness, improved after catheter exchange  Musculoskeletal:        General:  No signs of injury.     Cervical back: Normal range of motion.  Skin:    General: Skin is dry.  Neurological:     Mental Status: He is alert.  Psychiatric:        Speech: Speech normal.        Behavior: Behavior normal.     (all labs ordered are listed, but only abnormal results are displayed) Labs Reviewed  BASIC METABOLIC PANEL WITH GFR - Abnormal; Notable for the following components:      Result Value   Sodium 129 (*)    Chloride 97 (*)    CO2 18 (*)    Glucose, Bld 123 (*)    All other components within normal limits  CBC WITH DIFFERENTIAL/PLATELET - Abnormal; Notable for the following components:   WBC 12.7 (*)    RBC 3.20 (*)    Hemoglobin 7.9 (*)    HCT 26.3 (*)    MCH 24.7 (*)    RDW 17.3 (*)    Neutro Abs 10.9 (*)    All other components within normal limits  URINALYSIS, ROUTINE W REFLEX MICROSCOPIC    EKG: None  Radiology: No results found.   Procedures   Medications Ordered in the ED  lidocaine  (XYLOCAINE ) 2 % jelly 1 Application (1 Application Topical Given 08/29/24 0313)                                    Medical Decision Making Amount and/or Complexity of Data Reviewed Labs: ordered.   Patient presents with a chief complaint of urinary retention.  He has known issues with scar tissue and hematuria due to previous prostate surgery and radiation treatment.  The patient does have a mild leukocytosis this morning with a white count of 12,700.  No signs of AKI or other significant lab abnormality.  Hemoglobin is 7.9 this appears to be grossly at patient's baseline.  Patient initially showed approximately 100 mL of urine on bladder scan.  I believe there may have been more than this that was not showing on the scan.  Patient had a catheter exchange.  When the initial catheter was removed he was able to urinate briefly on his own and the total volume appeared to be greater than 100 mL.  The catheter was placed with return of urine.  The patient has no  discomfort at this time.  He is feeling much better and would like to discharge home.  He has plan to follow-up appointments with urology for lab work today and to see the urologist on Monday.  There is no indication at this time for further emergent workup or admission.  Patient discharged home in stable condition.  Return precautions provided.     Final diagnoses:  Urinary retention    ED Discharge Orders  Ordered    Lidocaine  4 % GEL        08/29/24 0347               Chrishaun, Sasso, PA-C 08/29/24 0347    Trine Raynell Moder, MD 08/29/24 [phone]

## 2024-08-29 NOTE — ED Notes (Signed)
 Patient was bladder scan and had around 100 ML

## 2024-08-29 NOTE — Discharge Instructions (Signed)
 You were evaluated in the emergency room for urinary retention.  Your catheter was flushed.  There are no evidence of UTI at this time.  Please follow-up with your urology team within the next week.  If you experience any recurrent retention before then please return emergency room.

## 2024-08-29 NOTE — ED Triage Notes (Signed)
 Pt returns for re-evaluation of urinary catheter. Reports recurrent pain 5/10.

## 2024-08-31 ENCOUNTER — Inpatient Hospital Stay (HOSPITAL_COMMUNITY)
Admission: EM | Admit: 2024-08-31 | Discharge: 2024-09-03 | DRG: 699 | Disposition: A | Discharge status: Home / Self Care | Attending: Internal Medicine | Admitting: Internal Medicine

## 2024-08-31 DIAGNOSIS — R339 Retention of urine, unspecified: Secondary | ICD-10-CM | POA: Diagnosis present

## 2024-08-31 DIAGNOSIS — T83091A Other mechanical complication of indwelling urethral catheter, initial encounter: Principal | ICD-10-CM | POA: Diagnosis present

## 2024-08-31 DIAGNOSIS — Z79899 Other long term (current) drug therapy: Secondary | ICD-10-CM

## 2024-08-31 DIAGNOSIS — R310 Gross hematuria: Secondary | ICD-10-CM | POA: Diagnosis not present

## 2024-08-31 DIAGNOSIS — Z8042 Family history of malignant neoplasm of prostate: Secondary | ICD-10-CM

## 2024-08-31 DIAGNOSIS — N139 Obstructive and reflux uropathy, unspecified: Secondary | ICD-10-CM | POA: Diagnosis present

## 2024-08-31 DIAGNOSIS — D649 Anemia, unspecified: Secondary | ICD-10-CM | POA: Diagnosis not present

## 2024-08-31 DIAGNOSIS — E785 Hyperlipidemia, unspecified: Secondary | ICD-10-CM | POA: Diagnosis present

## 2024-08-31 DIAGNOSIS — I10 Essential (primary) hypertension: Secondary | ICD-10-CM | POA: Diagnosis present

## 2024-08-31 DIAGNOSIS — D62 Acute posthemorrhagic anemia: Secondary | ICD-10-CM | POA: Diagnosis present

## 2024-08-31 DIAGNOSIS — R311 Benign essential microscopic hematuria: Secondary | ICD-10-CM

## 2024-08-31 DIAGNOSIS — Z8546 Personal history of malignant neoplasm of prostate: Secondary | ICD-10-CM

## 2024-08-31 DIAGNOSIS — Y738 Miscellaneous gastroenterology and urology devices associated with adverse incidents, not elsewhere classified: Secondary | ICD-10-CM | POA: Diagnosis present

## 2024-08-31 DIAGNOSIS — C61 Malignant neoplasm of prostate: Secondary | ICD-10-CM | POA: Diagnosis present

## 2024-08-31 DIAGNOSIS — Z720 Tobacco use: Secondary | ICD-10-CM | POA: Diagnosis present

## 2024-08-31 DIAGNOSIS — Z9079 Acquired absence of other genital organ(s): Secondary | ICD-10-CM

## 2024-08-31 DIAGNOSIS — R319 Hematuria, unspecified: Secondary | ICD-10-CM | POA: Diagnosis present

## 2024-08-31 DIAGNOSIS — Z800 Family history of malignant neoplasm of digestive organs: Secondary | ICD-10-CM

## 2024-08-31 DIAGNOSIS — Y842 Radiological procedure and radiotherapy as the cause of abnormal reaction of the patient, or of later complication, without mention of misadventure at the time of the procedure: Secondary | ICD-10-CM | POA: Diagnosis present

## 2024-08-31 DIAGNOSIS — Z923 Personal history of irradiation: Secondary | ICD-10-CM

## 2024-08-31 DIAGNOSIS — N3041 Irradiation cystitis with hematuria: Principal | ICD-10-CM | POA: Diagnosis present

## 2024-08-31 DIAGNOSIS — T839XXA Unspecified complication of genitourinary prosthetic device, implant and graft, initial encounter: Secondary | ICD-10-CM

## 2024-08-31 DIAGNOSIS — F17210 Nicotine dependence, cigarettes, uncomplicated: Secondary | ICD-10-CM | POA: Diagnosis present

## 2024-08-31 DIAGNOSIS — N179 Acute kidney failure, unspecified: Secondary | ICD-10-CM | POA: Diagnosis not present

## 2024-08-31 LAB — BASIC METABOLIC PANEL WITH GFR
Anion gap: 11 (ref 5–15)
BUN: 21 mg/dL (ref 8–23)
CO2: 21 mmol/L — ABNORMAL LOW (ref 22–32)
Calcium: 9.2 mg/dL (ref 8.9–10.3)
Chloride: 100 mmol/L (ref 98–111)
Creatinine, Ser: 1.23 mg/dL (ref 0.61–1.24)
GFR, Estimated: >60 mL/min (ref >60)
Glucose, Bld: 105 mg/dL — ABNORMAL HIGH (ref 70–99)
Potassium: 4.3 mmol/L (ref 3.5–5.1)
Sodium: 132 mmol/L — ABNORMAL LOW (ref 135–145)

## 2024-08-31 LAB — CBC WITH DIFFERENTIAL/PLATELET
Abs Immature Granulocytes: 0.06 K/uL (ref 0.00–0.07)
Basophils Absolute: 0 K/uL (ref 0.0–0.1)
Basophils Relative: 1 %
Eosinophils Absolute: 0.1 K/uL (ref 0.0–0.5)
Eosinophils Relative: 2 %
HCT: 22 % — ABNORMAL LOW (ref 39.0–52.0)
Hemoglobin: 6.5 g/dL — CL (ref 13.0–17.0)
Immature Granulocytes: 1 %
Lymphocytes Relative: 10 %
Lymphs Abs: 0.8 K/uL (ref 0.7–4.0)
MCH: 25.5 pg — ABNORMAL LOW (ref 26.0–34.0)
MCHC: 29.5 g/dL — ABNORMAL LOW (ref 30.0–36.0)
MCV: 86.3 fL (ref 80.0–100.0)
Monocytes Absolute: 0.7 K/uL (ref 0.1–1.0)
Monocytes Relative: 9 %
Neutro Abs: 6.2 K/uL (ref 1.7–7.7)
Neutrophils Relative %: 77 %
Platelets: 287 K/uL (ref 150–400)
RBC: 2.55 MIL/uL — ABNORMAL LOW (ref 4.22–5.81)
RDW: 17.2 % — ABNORMAL HIGH (ref 11.5–15.5)
WBC: 8 K/uL (ref 4.0–10.5)
nRBC: 0 % (ref 0.0–0.2)

## 2024-08-31 LAB — PROTIME-INR
INR: 1.2 (ref 0.8–1.2)
Prothrombin Time: 15.5 s — ABNORMAL HIGH (ref 11.4–15.2)

## 2024-08-31 LAB — PREPARE RBC (CROSSMATCH)

## 2024-08-31 MED ORDER — FENTANYL CITRATE PF 50 MCG/ML IJ SOSY
12.5000 ug | PREFILLED_SYRINGE | INTRAMUSCULAR | Status: DC | PRN
  Administered 2024-09-01: 50 ug via INTRAVENOUS
  Administered 2024-09-01: 25 ug via INTRAVENOUS
  Filled 2024-08-31 (×2): qty 1

## 2024-08-31 MED ORDER — ONDANSETRON HCL 4 MG PO TABS: 4.0000 mg | ORAL_TABLET | Freq: Four times a day (QID) | ORAL | Status: DC | PRN

## 2024-08-31 MED ORDER — ONDANSETRON HCL 4 MG/2ML IJ SOLN: 4.0000 mg | Freq: Four times a day (QID) | INTRAMUSCULAR | Status: DC | PRN

## 2024-08-31 MED ORDER — ACETAMINOPHEN 650 MG RE SUPP: 650.0000 mg | Freq: Four times a day (QID) | RECTAL | Status: DC | PRN

## 2024-08-31 MED ORDER — SODIUM CHLORIDE 0.9% IV SOLUTION: Freq: Once | INTRAVENOUS | Status: DC

## 2024-08-31 MED ORDER — ACETAMINOPHEN 325 MG PO TABS
650.0000 mg | ORAL_TABLET | Freq: Four times a day (QID) | ORAL | Status: DC | PRN
  Administered 2024-09-03: 650 mg via ORAL
  Filled 2024-08-31: qty 2

## 2024-08-31 MED ORDER — METOPROLOL SUCCINATE ER 50 MG PO TB24
150.0000 mg | ORAL_TABLET | Freq: Every morning | ORAL | Status: DC
  Administered 2024-09-01 – 2024-09-03 (×3): 150 mg via ORAL
  Filled 2024-08-31: qty 3
  Filled 2024-08-31 (×2): qty 1

## 2024-08-31 MED ORDER — SODIUM CHLORIDE 0.9 % IV SOLN: INTRAVENOUS | Status: AC

## 2024-08-31 MED ORDER — PANTOPRAZOLE SODIUM 40 MG PO TBEC
40.0000 mg | DELAYED_RELEASE_TABLET | Freq: Every day | ORAL | Status: DC
  Administered 2024-09-01 – 2024-09-03 (×3): 40 mg via ORAL
  Filled 2024-08-31 (×3): qty 1

## 2024-08-31 MED ORDER — HYDROCODONE-ACETAMINOPHEN 5-325 MG PO TABS
1.0000 | ORAL_TABLET | ORAL | Status: DC | PRN
  Administered 2024-08-31: 1 via ORAL
  Administered 2024-09-01 – 2024-09-02 (×2): 2 via ORAL
  Filled 2024-08-31 (×2): qty 2
  Filled 2024-08-31 (×2): qty 1
  Filled 2024-08-31: qty 2

## 2024-08-31 MED ORDER — HYDROMORPHONE HCL 1 MG/ML IJ SOLN
1.0000 mg | INTRAMUSCULAR | Status: DC | PRN
  Administered 2024-09-01 – 2024-09-02 (×4): 1 mg via INTRAVENOUS
  Filled 2024-08-31 (×3): qty 1

## 2024-08-31 NOTE — Assessment & Plan Note (Signed)
-  Spoke about importance of quitting spent 5 minutes discussing options for treatment, prior attempts at quitting, and dangers of smoking ? -At this point patient is    interested in quitting ? - order nicotine patch  ? - nursing tobacco cessation protocol ? ?

## 2024-08-31 NOTE — Assessment & Plan Note (Signed)
 Restart metoprolol  100 mg po q AM

## 2024-08-31 NOTE — Assessment & Plan Note (Signed)
Foley in place  

## 2024-08-31 NOTE — ED Provider Triage Note (Signed)
 Emergency Medicine Provider Triage Evaluation Note  Erik Gill , a 63 y.o. male  was evaluated in triage.  Pt complains of catheter problem and blood clots. Last irrigated at 1730 and had clots. Urinating around catheter  Was evaluated for retention on 08/29/24 for retention  Review of Systems  Positive: See hpi Negative:   Physical Exam  BP (!) 165/71 (BP Location: Right Arm)   Pulse 98   Temp 97.8 F (36.6 C) (Oral)   Resp 18   Ht 5' 8 (1.727 m)   Wt 88.5 kg   SpO2 100%   BMI 29.65 kg/m  Gen:   Awake, no distress   Resp:  Normal effort  MSK:   Moves extremities without difficulty  Other:  Mild suprapubic tenderness. Catheter bag with hematuria  Medical Decision Making  Medically screening exam initiated at 6:31 PM.  Appropriate orders placed.  Micco Bourbeau was informed that the remainder of the evaluation will be completed by another provider, this initial triage assessment does not replace that evaluation, and the importance of remaining in the ED until their evaluation is complete.  Needs assessment, irrigation, and bladder scan   Minnie Tinnie BRAVO, PA 08/31/24 [phone]

## 2024-08-31 NOTE — H&P (Signed)
 Erik Gill FMW:[id number] DOB: Jul 14, 1961 DOA: 08/31/2024     PCP: Arloa Elsie SAUNDERS, MD   Outpatient Specialists:   CARDS:   Dr. Georganna Archer, MD   Patient arrived to ER on 08/31/24 at 1823 Referred by Attending Charlyn Sora, MD   Patient coming from:    home Lives  With SO     Chief Complaint:   Chief Complaint  Patient presents with   Catheter Issue    HPI: Erik Gill is a 63 y.o. male with medical history significant of prostate cancer, HTN     Presented with   urinary catheter clogged up  Pt with recurrent  admits for  severe hematuria  Patient with hx of prostate cancer and is status post prostatectomy and radiation in 2023,  Resulted in anemia and foley occlusion in the past Patient was dc on 9/21 due to admit for the same issue New foley was placed 9/26 when it had to be irrigated and able to be sued The foley has clogged up again and pt presented to ER Hg is 6.5 now   Denies significant ETOH intake   Does  smoke  but interested in quitting     Regarding pertinent Chronic problems:      HTN on metoprolol  norvasc      Chronic anemia - baseline hg Hemoglobin & Hematocrit  Recent Labs    08/24/24 0743 08/29/24 0207 08/31/24 1935  HGB 8.1* 7.9* 6.5*   Iron/TIBC/Ferritin/ %Sat    Component Value Date/Time   IRON 18 (L) 08/22/2024 1920   TIBC 483 (H) 08/22/2024 1920   FERRITIN 14 (L) 08/22/2024 1920   IRONPCTSAT 4 (L) 08/22/2024 1920    Cancer: prostate cancer  While in ER: Clinical Course as of 08/31/24 2303  Sun Aug 31, 2024  2204 Case discussed with Dr. Lucious, urology.  I requested urology service to put patient on the list for rounding tomorrow. [AN]    Clinical Course User Index [AN] Charlyn Sora, MD    In ER was able to irrigate it and it started to work again Patient has declined 3 way irrigation catheter at this time     Lab Orders         Basic metabolic panel         CBC with Differential         Protime-INR        Following Medications were ordered in ER: Medications  0.9 %  sodium chloride  infusion (Manually program via Guardrails IV Fluids) (has no administration in time range)    _______________________________________________________ ER Provider Called:     Urology   Dr. Bobie They Recommend admit to medicine  will see in Am       ED Triage Vitals [08/31/24 1827]  Encounter Vitals Group     BP (!) 165/71     Girls Systolic BP Percentile      Girls Diastolic BP Percentile      Boys Systolic BP Percentile      Boys Diastolic BP Percentile      Pulse Rate 98     Resp 18     Temp 97.8 F (36.6 C)     Temp Source Oral     SpO2 100 %     Weight 195 lb (88.5 kg)     Height 5' 8 (1.727 m)     Head Circumference      Peak Flow      Pain Score  Pain Loc      Pain Education      Exclude from Growth Chart   [id number]     _________________________________________ Significant initial  Findings: Abnormal Labs Reviewed  BASIC METABOLIC PANEL WITH GFR - Abnormal; Notable for the following components:      Result Value   Sodium 132 (*)    CO2 21 (*)    Glucose, Bld 105 (*)    All other components within normal limits  CBC WITH DIFFERENTIAL/PLATELET - Abnormal; Notable for the following components:   RBC 2.55 (*)    Hemoglobin 6.5 (*)    HCT 22.0 (*)    MCH 25.5 (*)    MCHC 29.5 (*)    RDW 17.2 (*)    All other components within normal limits  PROTIME-INR - Abnormal; Notable for the following components:   Prothrombin Time 15.5 (*)    All other components within normal limits        ECG: Ordered   The recent clinical data is shown below. Vitals:   08/31/24 1827  BP: (!) 165/71  Pulse: 98  Resp: 18  Temp: 97.8 F (36.6 C)  TempSrc: Oral  SpO2: 100%  Weight: 88.5 kg  Height: 5' 8 (1.727 m)    WBC     Component Value Date/Time   WBC 8.0 08/31/2024 1935   LYMPHSABS 0.8 08/31/2024 1935   MONOABS 0.7 08/31/2024 1935   EOSABS 0.1 08/31/2024 1935   BASOSABS 0.0  08/31/2024 1935     Results for orders placed or performed during the hospital encounter of 02/19/24  Urine Culture     Status: None   Collection Time: 02/19/24 12:26 PM   Specimen: Urine, Clean Catch  Result Value Ref Range Status   Specimen Description URINE, CLEAN CATCH  Final   Special Requests NONE  Final   Culture   Final    NO GROWTH Performed at [facility], [address] [place], [place] [address]    Report Status 02/20/2024 FINAL  Final    __________________________________________________________ Recent Labs  Lab 08/29/24 0207 08/31/24 1935  NA 129* 132*  K 4.6 4.3  CO2 18* 21*  GLUCOSE 123* 105*  BUN 14 21  CREATININE 1.21 1.23  CALCIUM 10.1 9.2    Cr   stable,    Lab Results  Component Value Date   CREATININE 1.23 08/31/2024   CREATININE 1.21 08/29/2024   CREATININE 1.18 08/24/2024       Plt: Lab Results  Component Value Date   PLT 287 08/31/2024       Recent Labs  Lab 08/29/24 0207 08/31/24 1935  WBC 12.7* 8.0  NEUTROABS 10.9* 6.2  HGB 7.9* 6.5*  HCT 26.3* 22.0*  MCV 82.2 86.3  PLT 342 287    HG/HCT   Down  from baseline see below    Component Value Date/Time   HGB 6.5 (LL) 08/31/2024 1935   HCT 22.0 (L) 08/31/2024 1935   MCV 86.3 08/31/2024 1935     _______________________________________________ Hospitalist was called for admission for   Severe anemia hematuria urinary retention  The following Work up has been ordered so far:  Orders Placed This Encounter  Procedures   Basic metabolic panel   CBC with Differential   Protime-INR   Bladder scan   Irrigate Urinary Catheter (Urethral); Normal Saline; 70 mL; As Needed (PRN)   Informed Consent Details: Physician/Practitioner Attestation; Transcribe to consent form and obtain patient signature   Consult to urology  Consult to hospitalist   Type and screen   Prepare RBC (crossmatch)     OTHER Significant initial  Findings:  labs showing:     DM  labs:   HbA1C: No results for input(s): HGBA1C in the last 8760 hours.     CBG (last 3)  No results for input(s): GLUCAP in the last 72 hours.        Cultures:    Component Value Date/Time   SDES URINE, CLEAN CATCH 02/19/2024 1226   SPECREQUEST NONE 02/19/2024 1226   CULT  02/19/2024 1226    NO GROWTH Performed at [facility], [address] [place], [place] [address]    REPTSTATUS 02/20/2024 FINAL 02/19/2024 1226     Radiological Exams on Admission: No results found. _______________________________________________________________________________________________________ Latest  Blood pressure (!) 165/71, pulse 98, temperature 97.8 F (36.6 C), temperature source Oral, resp. rate 18, height 5' 8 (1.727 m), weight 88.5 kg, SpO2 100%.   Vitals  labs and radiology finding personally reviewed  Review of Systems:    Pertinent positives include:  abdominal pain hematuria  Constitutional:  No weight loss, night sweats, Fevers, chills, fatigue, weight loss  HEENT:  No headaches, Difficulty swallowing,Tooth/dental problems,Sore throat,  No sneezing, itching, ear ache, nasal congestion, post nasal drip,  Cardio-vascular:  No chest pain, Orthopnea, PND, anasarca, dizziness, palpitations.no Bilateral lower extremity swelling  GI:  No heartburn, indigestion, , nausea, vomiting, diarrhea, change in bowel habits, loss of appetite, melena, blood in stool, hematemesis Resp:  no shortness of breath at rest. No dyspnea on exertion, No excess mucus, no productive cough, No non-productive cough, No coughing up of blood.No change in color of mucus.No wheezing. Skin:  no rash or lesions. No jaundice GU:  no dysuria, change in color of urine, no urgency or frequency. No straining to urinate.  No flank pain.  Musculoskeletal:  No joint pain or no joint swelling. No decreased range of motion. No back pain.  Psych:  No change in mood or affect. No depression or anxiety. No memory loss.   Neuro: no localizing neurological complaints, no tingling, no weakness, no double vision, no gait abnormality, no slurred speech, no confusion  All systems reviewed and apart from HOPI all are negative _______________________________________________________________________________________________ Past Medical History:   Past Medical History:  Diagnosis Date   Elevated PSA    GERD (gastroesophageal reflux disease)    Headache    sinus related   Hypertension    Nocturia    Prostate cancer (HCC)    Wears glasses       Past Surgical History:  Procedure Laterality Date   HERNIA REPAIR Right    LYMPHADENECTOMY Bilateral 11/14/2021   Procedure: LYMPHADENECTOMY, PELVIC;  Surgeon: Renda Glance, MD;  Location: [facility];  Service: Urology;  Laterality: Bilateral;   PROSTATE BIOPSY N/A 08/29/2017   Procedure: BIOPSY TRANSRECTAL ULTRASONIC PROSTATE (TUBP);  Surgeon: Devere Lonni Righter, MD;  Location: [facility];  Service: Urology;  Laterality: N/A;   ROBOT ASSISTED LAPAROSCOPIC RADICAL PROSTATECTOMY N/A 11/14/2021   Procedure: XI ROBOTIC ASSISTED LAPAROSCOPIC RADICAL PROSTATECTOMY LEVEL 1;  Surgeon: Renda Glance, MD;  Location: [facility];  Service: Urology;  Laterality: N/A;    Social History:  Ambulatory   independently      reports that he has been smoking cigarettes. He has a 0.5 pack-year smoking history. He has never used smokeless tobacco. He reports that he does not currently use alcohol. He reports that he does not use drugs.  Family History:   Family History  Problem Relation Age of Onset   Cancer Father        colon cancer   Cancer Brother        prostate   Cancer Brother        prostate   Cancer Cousin        prostate cancer/maternal first cousin   ______________________________________________________________________________________________ Allergies: Allergies  Allergen Reactions   Other     Seasonal Allergies that are year round      Prior to Admission medications   Medication Sig Start Date End Date Taking? Authorizing Provider  acetaminophen  (TYLENOL ) 650 MG CR tablet Take 1,300 mg by mouth every 8 (eight) hours as needed for pain.    [provider]  albuterol  (VENTOLIN  HFA) 108 (90 Base) MCG/ACT inhaler Inhale 2 puffs into the lungs every 4 (four) hours as needed for wheezing or shortness of breath. 06/20/24   Arloa Suzen RAMAN, NP  amLODipine -benazepril  (LOTREL) 10-40 MG capsule Take 1 capsule by mouth in the morning. 09/28/16   [provider]  cetirizine (ZYRTEC ALLERGY) 10 MG tablet Take 10 mg by mouth in the morning.    [provider]  clotrimazole -betamethasone  (LOTRISONE ) cream Apply to affected area 2 times daily prn 08/22/23   Billy Asberry FALCON, PA-C  ferrous sulfate  325 (65 FE) MG tablet Take 1 tablet (325 mg total) by mouth daily. 04/11/24 08/22/24  Cottie Donnice PARAS, MD  fluticasone  (FLONASE ) 50 MCG/ACT nasal spray Place 1 spray into both nostrils daily for 3 days. Patient taking differently: Place 1 spray into both nostrils in the morning. 05/19/22 08/22/24  Hazen Darryle BRAVO, FNP  Lidocaine  4 % GEL Apply small amount to urethral opening twice daily as needed for irritation from foley catheter 08/29/24   Logan Martinis B, PA-C  metoprolol  succinate (TOPROL -XL) 100 MG 24 hr tablet Take 100 mg by mouth in the morning. Patient taking differently: Take 150 mg by mouth in the morning. 08/21/22   [provider]  Naphazoline-Pheniramine (OPCON-A ) 0.027-0.315 % SOLN Place 1 drop into both eyes daily as needed (allergies).    [provider]  nicotine  (NICODERM CQ  - DOSED IN MG/24 HOURS) 14 mg/24hr patch Place 1 patch (14 mg total) onto the skin daily. 08/20/24   Floretta Mallard, MD  ondansetron  (ZOFRAN ) 4 MG tablet Take 1 tablet (4 mg total) by mouth every 6 (six) hours as needed for nausea. 08/24/24   Sherrill Cable Latif, DO  pantoprazole  (PROTONIX ) 40 MG tablet Take 40 mg by  mouth as needed. 04/21/24   [provider]  sulfamethoxazole -trimethoprim  (BACTRIM  DS) 800-160 MG tablet Take 1 tablet by mouth 2 (two) times daily. 08/11/24   [provider]  tadalafil (CIALIS) 20 MG tablet Take 20 mg by mouth daily as needed for erectile dysfunction. 09/27/15   [provider]  triamcinolone  cream (KENALOG ) 0.1 % 1 Application as needed (skin irritation).    [provider]    ___________________________________________________________________________________________________ Physical Exam:    08/31/2024    6:27 PM 08/29/2024    8:25 AM 08/29/2024    6:18 AM  Vitals with BMI  Height 5' 8    Weight 195 lbs    BMI 29.66    Systolic 165 137 831  Diastolic 71 63 88  Pulse 98 72 112    1. General:  in    Acute distress complaining of severe pain agitated   2. Psychological: Alert and   Oriented 3. Head/ENT:  Dry Mucous Membranes                          Head Non traumatic, neck supple                          Normal   Dentition 4. SKIN:  decreased Skin turgor,  Skin clean Dry and intact no rash    5. Heart: Regular rate and rhythm no  Murmur, no Rub or gallop 6. Lungs:   no wheezes or crackles   7. Abdomen: Soft, suprapubic tender, Non distended  bowel sounds present 8. Lower extremities: no clubbing, cyanosis, no  edema 9. Neurologically Grossly intact, moving all 4 extremities equally   10. MSK: Normal range of motion    Chart has been reviewed  ______________________________________________________________________________________________  Assessment/Plan 63 y.o. male with medical history significant of prostate cancer, HTN     Admitted for  Severe anemia hematuria urinary retention   Present on Admission:  Symptomatic anemia  Complication, blocked Foley catheter, initial encounter  Acute on chronic anemia  Malignant neoplasm of prostate (HCC)  Essential hypertension  Tobacco abuse  Hematuria  Urinary retention      Complication, blocked Foley catheter, initial encounter Discussed with pt placement of 22 [place].for irrigation Pt now agrees Appreciate urology consult in AM  Acute on chronic anemia Transfuse 2 units and follow up cbc  Malignant neoplasm of prostate (HCC) Chronic stable followed up by urology   Essential hypertension Restart metoprolol  100 mg po q AM   Tobacco abuse  - Spoke about importance of quitting spent 5 minutes discussing options for treatment, prior attempts at quitting, and dangers of smoking  -At this point patient is     interested in quitting  - order nicotine  patch   - nursing tobacco cessation protocol   Hematuria Recurrent appreciate Urology consult  Urinary retention Foley in place    Other plan as per orders.  DVT prophylaxis:  SCD     Code Status:    Code Status: Prior FULL CODE as per patient   I had personally discussed CODE STATUS with patient  ACP   none    Family Communication:   Family not at  Bedside    Diet  heart healthy   Disposition Plan:     To home once workup is complete and patient is stable   Following barriers for discharge:                                 Anemia corrected h/H stable                             Pain controlled with PO medications                                                         Will need consultants to evaluate patient prior to discharge                         Consult Orders  (From admission, onward)           Start  Ordered   08/31/24 2104  Consult to hospitalist  Once       Provider:  (Not yet assigned)  Question Answer Comment  Place call to: [facility]   Reason for Consult Admit      08/31/24 2103             Consults called: Urology    Admission status:  ED Disposition     ED Disposition  Admit   Condition  --   Comment  Hospital Area: [facility] [[id number]]  Level of Care: Telemetry [5]  Admit to tele based on following criteria:  Other see comments  Comments: anemia  May place patient in observation at [facility] or [facility] if equivalent level of care is available:: No  Covid Evaluation: Asymptomatic - no recent exposure (last 10 days) testing not required  Diagnosis: Symptomatic anemia [8671310]  Admitting Physician: Evangelynn Lochridge [[id number]]  Attending Physician: Shakara Tweedy [[id number]]  For patients discharging to extended facilities (i.e. SNF, AL, group homes or LTAC) initiate:: Discharge to SNF/Facility Placement COVID-19 Lab Testing Protocol           Obs     Level of care     tele  For 12H   Valyncia Wiens 08/31/2024, 11:09 PM    [facility]     after 2 AM please page floor coverage   If 7AM-7PM, please contact the day team taking care of the patient using [email or web address]

## 2024-08-31 NOTE — ED Notes (Signed)
 Date and time results received: 08/31/24 8:21 PM  (use smartphrase .now to insert current time)  Test: Hemoglobin Critical Value: 6.5  Name of Provider Notified: Nanavati  Orders Received? Or Actions Taken?: MAR

## 2024-08-31 NOTE — Assessment & Plan Note (Signed)
 Recurrent appreciate Urology consult

## 2024-08-31 NOTE — ED Provider Notes (Addendum)
 [place] EMERGENCY DEPARTMENT AT [facility] Provider Note   CSN: [id number] Arrival date & time: 08/31/24  8176     Patient presents with: Catheter Issue   Erik Gill is a 63 y.o. male.   HPI     63 y.o. male with history of prostatectomy, radiation presents with urinary retention.  Patient has a Foley catheter that was placed on 9-26.  Patient returned to the ER with clogged catheter again, at that time the Foley catheter was irrigated and he was discharged.  He was offered three-way Foley catheter, but he declined.  Patient returns to the ER today indicating that he is catheter is clogged again.   Prior to Admission medications   Medication Sig Start Date End Date Taking? Authorizing Provider  acetaminophen  (TYLENOL ) 650 MG CR tablet Take 1,300 mg by mouth every 8 (eight) hours as needed for pain.    [provider]  albuterol  (VENTOLIN  HFA) 108 (90 Base) MCG/ACT inhaler Inhale 2 puffs into the lungs every 4 (four) hours as needed for wheezing or shortness of breath. 06/20/24   Arloa Suzen RAMAN, NP  amLODipine -benazepril  (LOTREL) 10-40 MG capsule Take 1 capsule by mouth in the morning. 09/28/16   [provider]  cetirizine (ZYRTEC ALLERGY) 10 MG tablet Take 10 mg by mouth in the morning.    [provider]  clotrimazole -betamethasone  (LOTRISONE ) cream Apply to affected area 2 times daily prn 08/22/23   Billy Asberry FALCON, PA-C  ferrous sulfate  325 (65 FE) MG tablet Take 1 tablet (325 mg total) by mouth daily. 04/11/24 08/22/24  Cottie Donnice PARAS, MD  fluticasone  (FLONASE ) 50 MCG/ACT nasal spray Place 1 spray into both nostrils daily for 3 days. Patient taking differently: Place 1 spray into both nostrils in the morning. 05/19/22 08/22/24  Hazen Darryle BRAVO, FNP  Lidocaine  4 % GEL Apply small amount to urethral opening twice daily as needed for irritation from foley catheter 08/29/24   Logan Ubaldo B, PA-C  metoprolol  succinate (TOPROL -XL) 100 MG  24 hr tablet Take 100 mg by mouth in the morning. Patient taking differently: Take 150 mg by mouth in the morning. 08/21/22   [provider]  Naphazoline-Pheniramine (OPCON-A ) 0.027-0.315 % SOLN Place 1 drop into both eyes daily as needed (allergies).    [provider]  nicotine  (NICODERM CQ  - DOSED IN MG/24 HOURS) 14 mg/24hr patch Place 1 patch (14 mg total) onto the skin daily. 08/20/24   Floretta Mallard, MD  ondansetron  (ZOFRAN ) 4 MG tablet Take 1 tablet (4 mg total) by mouth every 6 (six) hours as needed for nausea. 08/24/24   Sherrill Cable Latif, DO  pantoprazole  (PROTONIX ) 40 MG tablet Take 40 mg by mouth as needed. 04/21/24   [provider]  sulfamethoxazole -trimethoprim  (BACTRIM  DS) 800-160 MG tablet Take 1 tablet by mouth 2 (two) times daily. 08/11/24   [provider]  tadalafil (CIALIS) 20 MG tablet Take 20 mg by mouth daily as needed for erectile dysfunction. 09/27/15   [provider]  triamcinolone  cream (KENALOG ) 0.1 % 1 Application as needed (skin irritation).    [provider]    Allergies: Other    Review of Systems  All other systems reviewed and are negative.   Updated Vital Signs BP (!) 165/71 (BP Location: Right Arm)   Pulse 98   Temp 97.8 F (36.6 C) (Oral)   Resp 18   Ht 5' 8 (1.727 m)   Wt 88.5 kg   SpO2 100%  BMI 29.65 kg/m   Physical Exam Vitals and nursing note reviewed.  Constitutional:      Appearance: He is well-developed.  HENT:     Head: Atraumatic.  Eyes:     Extraocular Movements: Extraocular movements intact.     Pupils: Pupils are equal, round, and reactive to light.  Cardiovascular:     Rate and Rhythm: Normal rate.  Pulmonary:     Effort: Pulmonary effort is normal.  Musculoskeletal:     Cervical back: Neck supple.  Skin:    General: Skin is warm.  Neurological:     Mental Status: He is alert and oriented to person, place, and time.     (all labs ordered are listed, but  only abnormal results are displayed) Labs Reviewed  BASIC METABOLIC PANEL WITH GFR - Abnormal; Notable for the following components:      Result Value   Sodium 132 (*)    CO2 21 (*)    Glucose, Bld 105 (*)    All other components within normal limits  CBC WITH DIFFERENTIAL/PLATELET - Abnormal; Notable for the following components:   RBC 2.55 (*)    Hemoglobin 6.5 (*)    HCT 22.0 (*)    MCH 25.5 (*)    MCHC 29.5 (*)    RDW 17.2 (*)    All other components within normal limits  PROTIME-INR - Abnormal; Notable for the following components:   Prothrombin Time 15.5 (*)    All other components within normal limits  VITAMIN B12  FOLATE  IRON AND TIBC  FERRITIN  RETICULOCYTES  CK  MAGNESIUM  PHOSPHORUS  HEPATIC FUNCTION PANEL  TYPE AND SCREEN  PREPARE RBC (CROSSMATCH)    EKG: None  Radiology: No results found.   .Critical Care  Performed by: Charlyn Sora, MD Authorized by: Charlyn Sora, MD   Critical care provider statement:    Critical care time (minutes):  53   Critical care was necessary to treat or prevent imminent or life-threatening deterioration of the following conditions:  Circulatory failure   Critical care was time spent personally by me on the following activities:  Development of treatment plan with patient or surrogate, discussions with consultants, evaluation of patient's response to treatment, examination of patient, ordering and review of laboratory studies, ordering and review of radiographic studies, ordering and performing treatments and interventions, pulse oximetry, re-evaluation of patient's condition, review of old charts and obtaining history from patient or surrogate    Medications Ordered in the ED  0.9 %  sodium chloride  infusion (Manually program via Guardrails IV Fluids) (has no administration in time range)  0.9 %  sodium chloride  infusion (Manually program via Guardrails IV Fluids) (has no administration in time range)    Clinical  Course as of 08/31/24 2208  Sun Aug 31, 2024  2204 Case discussed with Dr. Lucious, urology.  I requested urology service to put patient on the list for rounding tomorrow. [AN]    Clinical Course User Index [AN] Charlyn Sora, MD                                 Medical Decision Making Amount and/or Complexity of Data Reviewed Labs: ordered.  Risk Prescription drug management. Decision regarding hospitalization.    63 year old male comes with chief complaint of clogged Foley catheter. He has history of prostatectomy, radiation presents with urinary retention.   I reviewed patient's records including recent ER visits and  CT hematuria protocol from 9-1.  Patient has no peritonitis at this time.  We will try to get basic labs to make sure his hemoglobin has not dropped profoundly and make sure his renal function is fine.  We will irrigate the Foley catheter.    I discussed with the patient that the next best step is to put in a three-way Foley catheter, irrigate the bladder.  It seems like urology had recommended the same last ER visit.  Patient is going to think about it.   Reassessment: Patient's hemoglobin is 6.5.  He has agreed to transfusion.  We will order 2 units of blood.  He has required 4 units of transfusion in the past.  Patient's Foley catheter was irrigated by the nurse.  We had about 500 cc of urine output. However, patient then started having difficulty with voiding again.  Patient discomfort.  He has agreed to placement of irrigation and three-way Foley catheter.    Final diagnoses:  Severe anemia  Gross hematuria  Problem with Foley catheter, initial encounter    ED Discharge Orders     None          Charlyn Sora, MD 08/31/24 2203    Charlyn Sora, MD 08/31/24 2204    Charlyn Sora, MD 08/31/24 2208

## 2024-08-31 NOTE — Assessment & Plan Note (Signed)
 Chronic stable followed up by urology

## 2024-08-31 NOTE — Assessment & Plan Note (Signed)
 Transfuse 2 units and follow up cbc

## 2024-08-31 NOTE — Subjective & Objective (Addendum)
 Pt with recurrent  admits for  severe hematuria  Patient with hx of prostate cancer and is status post prostatectomy and radiation in 2023,  Resulted in anemia and foley occlusion in the past Patient was dc on 9/21 due to admit for the same issue New foley was placed 9/26 when it had to be irrigated and able to be sued The foley has clogged up again and pt presented to ER Hg is 6.5 now

## 2024-08-31 NOTE — ED Triage Notes (Addendum)
 Recent admission for catheter issue - had another one placed after discharge around last Thursday, now having drainage issues.Some drainage from around the penis insertion site. Some drainage into the urine bag.No pain or discomfort at this time.

## 2024-08-31 NOTE — ED Notes (Signed)
 22 2way  foley placed per MD while awaiting delivery of 22 3 way from PACU

## 2024-08-31 NOTE — Assessment & Plan Note (Signed)
 Discussed with pt placement of 22 [place].for irrigation Pt now agrees Appreciate urology consult in AM

## 2024-09-01 ENCOUNTER — Observation Stay (HOSPITAL_COMMUNITY)

## 2024-09-01 DIAGNOSIS — D649 Anemia, unspecified: Secondary | ICD-10-CM | POA: Diagnosis not present

## 2024-09-01 LAB — CBC
HCT: 31.9 % — ABNORMAL LOW (ref 39.0–52.0)
HCT: 28.7 % — ABNORMAL LOW (ref 39.0–52.0)
Hemoglobin: 9.7 g/dL — ABNORMAL LOW (ref 13.0–17.0)
Hemoglobin: 9.1 g/dL — ABNORMAL LOW (ref 13.0–17.0)
MCH: 25.9 pg — ABNORMAL LOW (ref 26.0–34.0)
MCH: 26.3 pg (ref 26.0–34.0)
MCHC: 30.4 g/dL (ref 30.0–36.0)
MCHC: 31.7 g/dL (ref 30.0–36.0)
MCV: 85.1 fL (ref 80.0–100.0)
MCV: 82.9 fL (ref 80.0–100.0)
Platelets: 330 K/uL (ref 150–400)
Platelets: 253 K/uL (ref 150–400)
RBC: 3.75 MIL/uL — ABNORMAL LOW (ref 4.22–5.81)
RBC: 3.46 MIL/uL — ABNORMAL LOW (ref 4.22–5.81)
RDW: 15.9 % — ABNORMAL HIGH (ref 11.5–15.5)
RDW: 16 % — ABNORMAL HIGH (ref 11.5–15.5)
WBC: 14.8 K/uL — ABNORMAL HIGH (ref 4.0–10.5)
WBC: 18.2 K/uL — ABNORMAL HIGH (ref 4.0–10.5)
nRBC: 0.2 % (ref 0.0–0.2)
nRBC: 0.1 % (ref 0.0–0.2)

## 2024-09-01 LAB — COMPREHENSIVE METABOLIC PANEL WITH GFR
ALT: 14 U/L (ref 0–44)
AST: 24 U/L (ref 15–41)
Albumin: 4.7 g/dL (ref 3.5–5.0)
Alkaline Phosphatase: 88 U/L (ref 38–126)
Anion gap: 20 — ABNORMAL HIGH (ref 5–15)
BUN: 23 mg/dL (ref 8–23)
CO2: 15 mmol/L — ABNORMAL LOW (ref 22–32)
Calcium: 9.9 mg/dL (ref 8.9–10.3)
Chloride: 98 mmol/L (ref 98–111)
Creatinine, Ser: 2.15 mg/dL — ABNORMAL HIGH (ref 0.61–1.24)
GFR, Estimated: 34 mL/min — ABNORMAL LOW (ref >60)
Glucose, Bld: 230 mg/dL — ABNORMAL HIGH (ref 70–99)
Potassium: 4.5 mmol/L (ref 3.5–5.1)
Sodium: 133 mmol/L — ABNORMAL LOW (ref 135–145)
Total Bilirubin: 1 mg/dL (ref 0.0–1.2)
Total Protein: 7.4 g/dL (ref 6.5–8.1)

## 2024-09-01 LAB — RETICULOCYTES
Immature Retic Fract: 17.3 % — ABNORMAL HIGH (ref 2.3–15.9)
RBC.: 3.55 MIL/uL — ABNORMAL LOW (ref 4.22–5.81)
Retic Count, Absolute: 142.5 K/uL (ref 19.0–186.0)
Retic Ct Pct: 4 % — ABNORMAL HIGH (ref 0.4–3.1)

## 2024-09-01 LAB — IRON AND TIBC
Iron: 34 ug/dL — ABNORMAL LOW (ref 45–182)
Saturation Ratios: 7 % — ABNORMAL LOW (ref 17.9–39.5)
TIBC: 462 ug/dL — ABNORMAL HIGH (ref 250–450)
UIBC: 428 ug/dL

## 2024-09-01 LAB — PHOSPHORUS: Phosphorus: 4.9 mg/dL — ABNORMAL HIGH (ref 2.5–4.6)

## 2024-09-01 LAB — FOLATE: Folate: 12.2 ng/mL (ref >5.9)

## 2024-09-01 LAB — HEPATIC FUNCTION PANEL
ALT: 15 U/L (ref 0–44)
AST: 22 U/L (ref 15–41)
Albumin: 4.7 g/dL (ref 3.5–5.0)
Alkaline Phosphatase: 88 U/L (ref 38–126)
Bilirubin, Direct: 0.4 mg/dL — ABNORMAL HIGH (ref 0.0–0.2)
Indirect Bilirubin: 0.6 mg/dL (ref 0.3–0.9)
Total Bilirubin: 1 mg/dL (ref 0.0–1.2)
Total Protein: 7.4 g/dL (ref 6.5–8.1)

## 2024-09-01 LAB — CK: Total CK: 242 U/L (ref 49–397)

## 2024-09-01 LAB — VITAMIN B12: Vitamin B-12: 681 pg/mL (ref 180–914)

## 2024-09-01 LAB — FERRITIN: Ferritin: 19 ng/mL — ABNORMAL LOW (ref 24–336)

## 2024-09-01 LAB — MAGNESIUM: Magnesium: 2.3 mg/dL (ref 1.7–2.4)

## 2024-09-01 MED ORDER — MORPHINE SULFATE (PF) 4 MG/ML IV SOLN: 4.0000 mg | Freq: Once | INTRAVENOUS | Status: DC

## 2024-09-01 MED ORDER — AMLODIPINE BESYLATE 10 MG PO TABS
10.0000 mg | ORAL_TABLET | Freq: Every day | ORAL | Status: DC
  Administered 2024-09-01 – 2024-09-03 (×3): 10 mg via ORAL
  Filled 2024-09-01 (×3): qty 1

## 2024-09-01 MED ORDER — NICOTINE 14 MG/24HR TD PT24
14.0000 mg | MEDICATED_PATCH | Freq: Every day | TRANSDERMAL | Status: DC | PRN
  Filled 2024-09-01: qty 1

## 2024-09-01 MED ORDER — CEFAZOLIN SODIUM-DEXTROSE 2-4 GM/100ML-% IV SOLN
2.0000 g | Freq: Once | INTRAVENOUS | Status: AC
  Administered 2024-09-01: 2 g via INTRAVENOUS
  Filled 2024-09-01: qty 100

## 2024-09-01 MED ORDER — HYDROMORPHONE HCL 1 MG/ML IJ SOLN
1.0000 mg | Freq: Once | INTRAMUSCULAR | Status: AC
  Administered 2024-09-01: 1 mg via INTRAVENOUS
  Filled 2024-09-01: qty 1

## 2024-09-01 MED ORDER — BENAZEPRIL HCL 20 MG PO TABS
40.0000 mg | ORAL_TABLET | Freq: Every day | ORAL | Status: DC
  Administered 2024-09-01 – 2024-09-03 (×3): 40 mg via ORAL
  Filled 2024-09-01 (×3): qty 2

## 2024-09-01 MED ORDER — TRANEXAMIC ACID-NACL 1000-0.7 MG/100ML-% IV SOLN
1000.0000 mg | Freq: Once | INTRAVENOUS | Status: AC
  Administered 2024-09-01: 1000 mg via INTRAVENOUS
  Filled 2024-09-01: qty 100

## 2024-09-01 MED ORDER — CHLORHEXIDINE GLUCONATE CLOTH 2 % EX PADS
6.0000 | MEDICATED_PAD | Freq: Every day | CUTANEOUS | Status: DC
Start: 2024-09-01 — End: 2024-09-03
  Administered 2024-09-01 – 2024-09-02 (×2): 6 via TOPICAL

## 2024-09-01 MED ORDER — LIDOCAINE HCL URETHRAL/MUCOSAL 2 % EX GEL
1.0000 | Freq: Once | CUTANEOUS | Status: AC
  Administered 2024-09-01: 1 via URETHRAL
  Filled 2024-09-01: qty 5

## 2024-09-01 MED ORDER — OFLOXACIN 0.3 % OP SOLN
5.0000 [drp] | Freq: Two times a day (BID) | OPHTHALMIC | Status: DC
Start: 2024-09-01 — End: 2024-09-03
  Administered 2024-09-01 – 2024-09-02 (×2): 5 [drp] via OTIC
  Filled 2024-09-01: qty 5

## 2024-09-01 MED ORDER — FLUTICASONE PROPIONATE 50 MCG/ACT NA SUSP
1.0000 | Freq: Every morning | NASAL | Status: DC
  Administered 2024-09-02 – 2024-09-03 (×2): 1 via NASAL
  Filled 2024-09-01: qty 16

## 2024-09-01 MED ORDER — HYDRALAZINE HCL 20 MG/ML IJ SOLN
5.0000 mg | INTRAMUSCULAR | Status: DC | PRN
Start: 2024-09-01 — End: 2024-09-03
  Administered 2024-09-01: 5 mg via INTRAVENOUS
  Filled 2024-09-01: qty 1

## 2024-09-01 MED ORDER — LIDOCAINE HCL URETHRAL/MUCOSAL 2 % EX GEL
1.0000 | Freq: Once | CUTANEOUS | Status: AC
Start: 2024-09-01 — End: 2024-09-01
  Administered 2024-09-01: 1 via TOPICAL
  Filled 2024-09-01: qty 5

## 2024-09-01 MED ORDER — HYDROMORPHONE HCL 1 MG/ML IJ SOLN: 1.0000 mg | Freq: Once | INTRAMUSCULAR | Status: DC

## 2024-09-01 NOTE — Plan of Care (Signed)
   Problem: Education: Goal: Knowledge of General Education information will improve Description Including pain rating scale, medication(s)/side effects and non-pharmacologic comfort measures Outcome: Progressing   Problem: Clinical Measurements: Goal: Ability to maintain clinical measurements within normal limits will improve Outcome: Progressing   Problem: Clinical Measurements: Goal: Will remain free from infection Outcome: Progressing

## 2024-09-01 NOTE — Progress Notes (Signed)
 MEWS Progress Note  Patient Details Name: Erik Gill MRN: [id number] DOB: [birth year] Today's Date: 09/01/2024   MEWS Flowsheet Documentation:  Assess: MEWS Score Temp: 98.3 F (36.8 C) BP: (!) 208/103 MAP (mmHg): 135 Pulse Rate: 85 ECG Heart Rate: 70 Resp: 19 Level of Consciousness: Alert SpO2: 100 % O2 Device: Room Air Assess: MEWS Score MEWS Temp: 0 MEWS Systolic: 2 MEWS Pulse: 0 MEWS RR: 0 MEWS LOC: 0 MEWS Score: 2 MEWS Score Color: Yellow Assess: SIRS CRITERIA SIRS Temperature : 0 SIRS Respirations : 0 SIRS Pulse: 0 SIRS WBC: 0 SIRS Score Sum : 0 SIRS Temperature : 0 SIRS Pulse: 0 SIRS Respirations : 0 SIRS WBC: 0 SIRS Score Sum : 0 Assess: if the MEWS score is Yellow or Red Were vital signs accurate and taken at a resting state?: Yes Does the patient meet 2 or more of the SIRS criteria?: No MEWS guidelines implemented : Yes, yellow Treat MEWS Interventions: Considered administering scheduled or prn medications/treatments as ordered Take Vital Signs Increase Vital Sign Frequency : Yellow: Q2hr x1, continue Q4hrs until patient remains green for 12hrs Escalate MEWS: Escalate: Yellow: Discuss with charge nurse and consider notifying provider and/or RRT     Scheduled BP meds given, MD notified.    Blane KANDICE Baller 09/01/2024, 4:50 PM

## 2024-09-01 NOTE — Treatment Plan (Addendum)
 Evaluated patient beside for concern for clot obstruction. Irrigated the patient with over 2L of sterile water  and obtained about 200ccs of stringy clot, all of which appeared old. After extensive irrigation, the urine cleared completely with no clot. CBI was restarted on a slow drip to prevent reformation of clot. TXA was given during this time.   Will continue CBI on slow drip through evening. If urine remains clear, can clamp at 4AM.   Okay to give diet today, will make NPO at midnight in case of need for procedure tomorrow (though unlikely if urine remains clear).   Lyle GEANNIE Civil, MD Resident, PGY4 Department of Urology

## 2024-09-01 NOTE — Hospital Course (Signed)
 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  Urology is consulting, treated with aggressive hand irrigation with hydrogen peroxide to break up organized clot.  On CBI.  Given Cefazolin  x 1.  He was due to restart hyperbaric oxygen  today.  NPO for now pending reevaluation of urine this PM.

## 2024-09-01 NOTE — Progress Notes (Signed)
 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  Urology is consulting, treated with aggressive hand irrigation with hydrogen peroxide to break up organized clot.  On CBI.  Given Cefazolin  x 1.  He was due to restart hyperbaric oxygen  today.  NPO for now pending reevaluation of urine this PM.  Assessment and Plan:  Hematuria, blocked Foley catheter, initial encounter Presented with gross hematuria, likely due to radiation cystitis and clot obstruction He has been undergoing hyperbaric oxygen  therapy (due for another treatment today, actually) Underwent placement of 3-way catheter for irrigation Urology consulting Underwent hand irrigation with sterile water  and hydrogen peroxide Started on CBI with plan to wean to light pink urine Given TXA x 1, previously on TID PO so may need to restart this Given cefazolin  x 1 Underwent an additional 2L sterile water  irrigation Restarted on CBI with plan to clamp at 4am if urine remains clear NPO after MN in case a procedure is needed tomorrow   Acute on chronic anemia Hgb 7.9 on 9/26, normocytic and likely associated with chronic disease and/or ABLA Hgb 6.5 on presentation, likely due to ABLA Transfused 2 units  Hgb up to 9.7 Recheck CBC q5pm and 5am x 2   Malignant neoplasm of prostate  S/p prostatectomy and radiation (2023) Recurrent issues with hematuria and clot obstruction Chronic foley Followed by urology  Undergoing hyperbaric oxygen  therapy   Essential hypertension Resume Toprol  XL, amlodipine , benazepril     Tobacco abuse Encourage cessation.   Patch ordered      Consultants: Urology  Procedures: None  Antibiotics: Cefazolin  x 1  30 Day  Unplanned Readmission Risk Score    Flowsheet Row ED to Hosp-Admission (Discharged) from 08/22/2024 in [place] [facility] FLOOR PROGRESSIVE CARE AND UROLOGY  30 Day Unplanned Readmission Risk Score (%) 17.12 Filed at 08/24/2024 1200    This score is the patient's risk of an unplanned readmission within 30 days of being discharged (0 -100%). The score is based on dignosis, age, lab data, medications, orders, and past utilization.   Low:  0-14.9   Medium: 15-21.9   High: 22-29.9   Extreme: 30 and above           Subjective: Uncomfortable at the time of my evaluation due to persistent clots causing foley obstruction   Objective: Vitals:   09/01/24 0920 09/01/24 1048  BP: (!) 169/71 (!) 169/71  Pulse: 78 78  Resp: 20   Temp: 98.9 F (37.2 C)   SpO2:      Intake/Output Summary (Last 24 hours) at 09/01/2024 1400 Last data filed at 09/01/2024 0920 Gross per 24 hour  Intake 5079.5 ml  Output 6000 ml  Net -920.5 ml   Filed Weights   08/31/24 1827  Weight: 88.5 kg    Exam:  General:  Appears calm but uncomfortable and is in NAD Eyes:  normal lids, iris ENT:  grossly normal hearing, lips & tongue, mmm Cardiovascular:  RRR. No LE edema.  Respiratory:   CTA bilaterally with no wheezes/rales/rhonchi.  Normal respiratory effort. Abdomen:  soft, NT, ND Skin:  no rash or induration seen on limited exam Musculoskeletal:  grossly normal tone BUE/BLE, good ROM, no bony abnormality Psychiatric:  blunted mood and affect, speech  fluent and appropriate, AOx3 Neurologic:  CN 2-12 grossly intact, moves all extremities in coordinated fashion  Data Reviewed: I have reviewed the patient's lab results since admission.  Pertinent labs for today include:   Na++ 132, not clinically significant Glucose 105 WBC 8 Hgb 6.5, down from 7.9 on 9/26     Family Communication: None present     Code Status: Full Code   Disposition: Status is: Observation The patient will require care spanning >  2 midnights and should be moved to inpatient because: ongoing bleeding, may require a urologic procedure.   Time spent: 50 minutes  Unresulted Labs (From admission, onward)     Start     Ordered   09/01/24 1700  CBC  5A & 5P,   R      09/01/24 1351   08/31/24 2129  Ferritin  (Anemia Panel (PNL))  Add-on,   AD        08/31/24 2128   Unscheduled  Basic metabolic panel with GFR  Tomorrow morning,   R        09/01/24 1400             Author: Delon Herald, MD 09/01/2024 2:00 PM  For on call review [email or web address].

## 2024-09-01 NOTE — Plan of Care (Signed)
°  Problem: Activity: °Goal: Risk for activity intolerance will decrease °Outcome: Progressing °  °Problem: Elimination: °Goal: Will not experience complications related to bowel motility °Outcome: Progressing °Goal: Will not experience complications related to urinary retention °Outcome: Progressing °  °

## 2024-09-01 NOTE — Consult Note (Addendum)
 Urology Consult   Reason for consult: hematuria   History of Present Illness: Erik Gill is a 63 y.o. with hx HTN, HLD, prostate CA s/p prostatectomy (2023) presented with hematuria likely 2/2 radiation cystitis and clot obstruction. Undergoing hyperbaric oxygen  therapy (x1 session thus far).   At presentation to ED, 3-way catheter placed and irrigated. CBI was started. Has acute blood loss anemia with Hgb 6.5, receiving 2u pRBCs.   On my evaluation, patient is visibly uncomfortable with non draining catheter. After replacing catheter with a 24Fr, I hand irrigated with 2L of sterile water  and hydrogen peroxide. Ultimately, was able to pull out ~100ccs of clot and Foley is running light pink on slow drip CBI.  Cr 1.23.  Previous CTAP from 9/19 with no hydronephrosis, no obvious filling defects in upper tract.   Follows with Dr. Renda - From prior notes, he has many many episodes of hematuria with clot obstruction. See below from Urochart.   08/06/2024: Erik Gill again developed gross hematuria on Friday and presented to the emergency department with clot retention requiring catheter placement. His catheter subsequently became clogged and he returned to the emergency department on Sunday and was admitted overnight and placed on continuous bladder irrigation. This was able to be stopped on Monday and he was able to be discharged home. He also was administered 1 unit of packed red blood cells due to a hemoglobin of 7.3 and his hemoglobin increased to 8.6. However, his hemoglobin was at similar levels back in May. He did have a hematuria protocol CT scan performed again which demonstrated findings consistent with his radiation cystitis. He previously had undergone cystoscopy and a full evaluation back in the spring.  08/11/2024: He passed trial of void at last exam. He was referred for hyperbaric oxygen  therapy to treat underlying radiation cystitis. He is seen acutely this afternoon due to not  being able to void which started this morning. Over the past several days he felt like he was voiding well without significant issue outside of some mild intermittent hematuria and passage of small clots. His PVR here is 477 mL. Per his request nursing staff performed an In-N-Out catheter and draining about 400 mL of hematuria containing urine. He did not want to have an indwelling Foley placed. Urine culture assessed at last week's visit was positive for bacterial growth.  He returns later this afternoon having not voided. Bladder residual still elevated. He is beginning to get a little uncomfortable with increasing urgency and inability to start a stream.  08/28/2024: Patient here today as an acute work in. He was recently hospitalized on 9/19 to 9/21 for acute blood loss anemia in the setting of refractory hemorrhagic radiation cystitis. He was given 3 units of blood and TXA and hemoglobin stabilized. He started hyperbaric oxygen  therapy but had eustachian discomfort, plans to have eustachian tubes placed so that he can continue hyperbaric oxygen  therapy. His catheter was removed on discharge and he initially was voiding well but this has progressively worsened. Today he has had difficulty voiding and increasing abdominal discomfort. He has had some gross hematuria without clots. PVR 402 cc.  08/29/2024: Patient returns today send keep working. His catheter stopped draining last night he went to the ER, they exchanged it for a 16 [place] and was draining at discharge. He again has had difficulty with it not draining this afternoon. He has seen a few clots passed into the bag. He attempted irrigation but was met with a lot of resistance.  Past Medical History:  Diagnosis Date   Elevated PSA    GERD (gastroesophageal reflux disease)    Headache    sinus related   Hypertension    Nocturia    Prostate cancer (HCC)    Wears glasses     Past Surgical History:  Procedure Laterality Date   HERNIA REPAIR  Right    LYMPHADENECTOMY Bilateral 11/14/2021   Procedure: LYMPHADENECTOMY, PELVIC;  Surgeon: Renda Glance, MD;  Location: [facility];  Service: Urology;  Laterality: Bilateral;   PROSTATE BIOPSY N/A 08/29/2017   Procedure: BIOPSY TRANSRECTAL ULTRASONIC PROSTATE (TUBP);  Surgeon: Devere Lonni Righter, MD;  Location: [facility];  Service: Urology;  Laterality: N/A;   ROBOT ASSISTED LAPAROSCOPIC RADICAL PROSTATECTOMY N/A 11/14/2021   Procedure: XI ROBOTIC ASSISTED LAPAROSCOPIC RADICAL PROSTATECTOMY LEVEL 1;  Surgeon: Renda Glance, MD;  Location: [facility];  Service: Urology;  Laterality: N/A;    Current Hospital Medications:  Home Meds:  No current facility-administered medications on file prior to encounter.   Current Outpatient Medications on File Prior to Encounter  Medication Sig Dispense Refill   acetaminophen  (TYLENOL ) 500 MG tablet Take 500 mg by mouth every 6 (six) hours as needed.     albuterol  (VENTOLIN  HFA) 108 (90 Base) MCG/ACT inhaler Inhale 2 puffs into the lungs every 4 (four) hours as needed for wheezing or shortness of breath. 8 g 0   amLODipine -benazepril  (LOTREL) 10-40 MG capsule Take 1 capsule by mouth in the morning.     cetirizine (ZYRTEC ALLERGY) 10 MG tablet Take 10 mg by mouth in the morning.     ferrous sulfate  325 (65 FE) MG EC tablet Take 325 mg by mouth daily with breakfast.     fluticasone  (FLONASE ) 50 MCG/ACT nasal spray Place 2 sprays into both nostrils daily.     Lidocaine  4 % GEL Apply small amount to urethral opening twice daily as needed for irritation from foley catheter 30 g 0   metoprolol  succinate (TOPROL -XL) 100 MG 24 hr tablet Take 100 mg by mouth in the morning. (Patient taking differently: Take 150 mg by mouth in the morning.)     Naphazoline-Pheniramine (OPCON-A ) 0.027-0.315 % SOLN Place 1 drop into both eyes daily as needed (allergies).     nicotine  (NICODERM CQ  - DOSED IN MG/24 HOURS) 14 mg/24hr patch Place 1 patch (14 mg total)  onto the skin daily. (Patient taking differently: Place 14 mg onto the skin daily as needed.) 28 patch 2   ofloxacin (FLOXIN) 0.3 % OTIC solution Place 5 drops into both ears 2 (two) times daily.     ondansetron  (ZOFRAN ) 4 MG tablet Take 1 tablet (4 mg total) by mouth every 6 (six) hours as needed for nausea. 20 tablet 0   pantoprazole  (PROTONIX ) 40 MG tablet Take 40 mg by mouth daily as needed.     tranexamic acid  (LYSTEDA ) 650 MG TABS tablet Take 1,300 mg by mouth 3 (three) times daily.     clotrimazole -betamethasone  (LOTRISONE ) cream Apply to affected area 2 times daily prn (Patient not taking: Reported on 08/31/2024) 45 g 0   ferrous sulfate  325 (65 FE) MG tablet Take 1 tablet (325 mg total) by mouth daily. 30 tablet 1   fluticasone  (FLONASE ) 50 MCG/ACT nasal spray Place 1 spray into both nostrils daily for 3 days. (Patient taking differently: Place 1 spray into both nostrils in the morning.) 16 g 0     Scheduled Meds:  sodium chloride    Intravenous Once   sodium chloride   Intravenous Once   metoprolol  succinate  150 mg Oral q AM   pantoprazole   40 mg Oral Daily   Continuous Infusions:  sodium chloride  75 mL/hr at 09/01/24 0022   PRN Meds:.acetaminophen  **OR** acetaminophen , fentaNYL  (SUBLIMAZE ) injection, HYDROcodone -acetaminophen , HYDROmorphone (DILAUDID) injection, ondansetron  **OR** ondansetron  (ZOFRAN ) IV  Allergies:  Allergies  Allergen Reactions   Other     Seasonal Allergies that are year round    Family History  Problem Relation Age of Onset   Cancer Father        colon cancer   Cancer Brother        prostate   Cancer Brother        prostate   Cancer Cousin        prostate cancer/maternal first cousin    Social History:  reports that he has been smoking cigarettes. He has a 0.5 pack-year smoking history. He has never used smokeless tobacco. He reports that he does not currently use alcohol. He reports that he does not use drugs.  ROS: A complete review of  systems was performed.  All systems are negative except for pertinent findings as noted.  Physical Exam:  Vital signs in last 24 hours: Temp:  [97.8 F (36.6 C)-99 F (37.2 C)] 98.9 F (37.2 C) (09/29 0405) Pulse Rate:  [78-98] 81 (09/29 0405) Resp:  [18-20] 18 (09/29 0405) BP: (147-165)/(71-87) 159/80 (09/29 0405) SpO2:  [100 %] 100 % (09/29 0353) Weight:  [88.5 kg] 88.5 kg (09/28 1827) Constitutional:  Alert and oriented, No acute distress Cardiovascular: Regular rate  Respiratory: Normal respiratory effort GI: Abdomen is soft, nontender, nondistended, no abdominal masses GU: Foley catheter in plce, slow drip CBI light pink urine  Lymphatic: No lymphadenopathy Neurologic: Grossly intact, no focal deficits Psychiatric: Normal mood and affect  Laboratory Data:  Recent Labs    08/31/24 1935  WBC 8.0  HGB 6.5*  HCT 22.0*  PLT 287    Recent Labs    08/31/24 1935  NA 132*  K 4.3  CL 100  GLUCOSE 105*  BUN 21  CALCIUM 9.2  CREATININE 1.23     Results for orders placed or performed during the hospital encounter of 08/31/24 (from the past 24 hours)  Basic metabolic panel     Status: Abnormal   Collection Time: 08/31/24  7:35 PM  Result Value Ref Range   Sodium 132 (L) 135 - 145 mmol/L   Potassium 4.3 3.5 - 5.1 mmol/L   Chloride 100 98 - 111 mmol/L   CO2 21 (L) 22 - 32 mmol/L   Glucose, Bld 105 (H) 70 - 99 mg/dL   BUN 21 8 - 23 mg/dL   Creatinine, Ser 8.76 0.61 - 1.24 mg/dL   Calcium 9.2 8.9 - 89.6 mg/dL   GFR, Estimated >39 >39 mL/min   Anion gap 11 5 - 15  CBC with Differential     Status: Abnormal   Collection Time: 08/31/24  7:35 PM  Result Value Ref Range   WBC 8.0 4.0 - 10.5 K/uL   RBC 2.55 (L) 4.22 - 5.81 MIL/uL   Hemoglobin 6.5 (LL) 13.0 - 17.0 g/dL   HCT 77.9 (L) 60.9 - 47.9 %   MCV 86.3 80.0 - 100.0 fL   MCH 25.5 (L) 26.0 - 34.0 pg   MCHC 29.5 (L) 30.0 - 36.0 g/dL   RDW 82.7 (H) 88.4 - 84.4 %   Platelets 287 150 - 400 K/uL   nRBC 0.0 0.0 - 0.2  %  Neutrophils Relative % 77 %   Neutro Abs 6.2 1.7 - 7.7 K/uL   Lymphocytes Relative 10 %   Lymphs Abs 0.8 0.7 - 4.0 K/uL   Monocytes Relative 9 %   Monocytes Absolute 0.7 0.1 - 1.0 K/uL   Eosinophils Relative 2 %   Eosinophils Absolute 0.1 0.0 - 0.5 K/uL   Basophils Relative 1 %   Basophils Absolute 0.0 0.0 - 0.1 K/uL   Immature Granulocytes 1 %   Abs Immature Granulocytes 0.06 0.00 - 0.07 K/uL  Protime-INR     Status: Abnormal   Collection Time: 08/31/24  7:35 PM  Result Value Ref Range   Prothrombin Time 15.5 (H) 11.4 - 15.2 seconds   INR 1.2 0.8 - 1.2  Type and screen     Status: None (Preliminary result)   Collection Time: 08/31/24 10:46 PM  Result Value Ref Range   ABO/RH(D) O POS    Antibody Screen NEG    Sample Expiration 09/03/2024,2359    Unit Number [id number]    Blood Component Type RED CELLS,LR    Unit division 00    Status of Unit ISSUED    Transfusion Status OK TO TRANSFUSE    Crossmatch Result      Compatible Performed at [facility], [address], [place], [place] [address]    Unit Number [id number]    Blood Component Type RED CELLS,LR    Unit division 00    Status of Unit ISSUED    Transfusion Status OK TO TRANSFUSE    Crossmatch Result Compatible   Prepare RBC (crossmatch)     Status: None   Collection Time: 08/31/24 10:47 PM  Result Value Ref Range   Order Confirmation      ORDER PROCESSED BY BLOOD BANK Performed at [facility], [address], [place], [place] [address]    No results found for this or any previous visit (from the past 240 hours).  Renal Function: Recent Labs    08/29/24 0207 08/31/24 1935  CREATININE 1.21 1.23   Estimated Creatinine Clearance: 67.3 mL/min (by C-G formula based on SCr of 1.23 mg/dL).  Radiologic Imaging: No results found.  I independently reviewed the above imaging studies.  Impression/Recommendation 63 y.o. 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   #Radiation cystitis S/p aggressive hand irrigation with hydrogen peroxide to break up organized clot -Formal bladder ultrasound to evaluate for remaining clot burden -Continue CBI, wean to light pink urine  -Administer TXA x1  -Ancef  dose x1 given extensive irrigation at beside  -Ideally, patient will be able to discharge from hospital as quickly as possible to return to hyperbaric oxygen  (which was scheduled to restart today) -Keep NPO until this afternoon, pending re-evaluation of urine.   Lyle SAILOR Anahlia Iseminger 09/01/2024, 4:54 AM

## 2024-09-01 NOTE — Progress Notes (Signed)
   09/01/24 0855  TOC Brief Assessment  Insurance and Status Reviewed  Patient has primary care physician Yes  Home environment has been reviewed Apartment  Prior level of function: Independent  Prior/Current Home Services No current home services  Social Drivers of Health Review SDOH reviewed no interventions necessary  Readmission risk has been reviewed Yes  Transition of care needs no transition of care needs at this time    Signed: Heather Saltness, MSW, LCSW Clinical Social Worker Inpatient Care Management 09/01/2024 8:56 AM

## 2024-09-02 ENCOUNTER — Other Ambulatory Visit: Payer: Self-pay

## 2024-09-02 ENCOUNTER — Encounter (HOSPITAL_COMMUNITY): Payer: Self-pay | Admitting: Internal Medicine

## 2024-09-02 DIAGNOSIS — N3041 Irradiation cystitis with hematuria: Secondary | ICD-10-CM | POA: Diagnosis present

## 2024-09-02 DIAGNOSIS — I10 Essential (primary) hypertension: Secondary | ICD-10-CM | POA: Diagnosis present

## 2024-09-02 DIAGNOSIS — D62 Acute posthemorrhagic anemia: Secondary | ICD-10-CM | POA: Diagnosis present

## 2024-09-02 DIAGNOSIS — Y842 Radiological procedure and radiotherapy as the cause of abnormal reaction of the patient, or of later complication, without mention of misadventure at the time of the procedure: Secondary | ICD-10-CM | POA: Diagnosis present

## 2024-09-02 DIAGNOSIS — D649 Anemia, unspecified: Secondary | ICD-10-CM | POA: Diagnosis not present

## 2024-09-02 DIAGNOSIS — Y738 Miscellaneous gastroenterology and urology devices associated with adverse incidents, not elsewhere classified: Secondary | ICD-10-CM | POA: Diagnosis present

## 2024-09-02 DIAGNOSIS — Z9079 Acquired absence of other genital organ(s): Secondary | ICD-10-CM | POA: Diagnosis not present

## 2024-09-02 DIAGNOSIS — N139 Obstructive and reflux uropathy, unspecified: Secondary | ICD-10-CM | POA: Diagnosis present

## 2024-09-02 DIAGNOSIS — F17210 Nicotine dependence, cigarettes, uncomplicated: Secondary | ICD-10-CM | POA: Diagnosis present

## 2024-09-02 DIAGNOSIS — E785 Hyperlipidemia, unspecified: Secondary | ICD-10-CM | POA: Diagnosis present

## 2024-09-02 DIAGNOSIS — Z8042 Family history of malignant neoplasm of prostate: Secondary | ICD-10-CM | POA: Diagnosis not present

## 2024-09-02 DIAGNOSIS — Z79899 Other long term (current) drug therapy: Secondary | ICD-10-CM | POA: Diagnosis not present

## 2024-09-02 DIAGNOSIS — R310 Gross hematuria: Secondary | ICD-10-CM | POA: Diagnosis present

## 2024-09-02 DIAGNOSIS — N179 Acute kidney failure, unspecified: Secondary | ICD-10-CM | POA: Diagnosis not present

## 2024-09-02 DIAGNOSIS — Z800 Family history of malignant neoplasm of digestive organs: Secondary | ICD-10-CM | POA: Diagnosis not present

## 2024-09-02 DIAGNOSIS — T83091A Other mechanical complication of indwelling urethral catheter, initial encounter: Secondary | ICD-10-CM | POA: Diagnosis present

## 2024-09-02 DIAGNOSIS — Z8546 Personal history of malignant neoplasm of prostate: Secondary | ICD-10-CM | POA: Diagnosis not present

## 2024-09-02 DIAGNOSIS — Z923 Personal history of irradiation: Secondary | ICD-10-CM | POA: Diagnosis not present

## 2024-09-02 LAB — BPAM RBC
Blood Product Expiration Date: 202510272359
Blood Product Expiration Date: 202510272359
ISSUE DATE / TIME: 202509290057
ISSUE DATE / TIME: 202509290447
Unit Type and Rh: 5100
Unit Type and Rh: 5100

## 2024-09-02 LAB — BASIC METABOLIC PANEL WITH GFR
Anion gap: 13 (ref 5–15)
BUN: 23 mg/dL (ref 8–23)
CO2: 19 mmol/L — ABNORMAL LOW (ref 22–32)
Calcium: 9.3 mg/dL (ref 8.9–10.3)
Chloride: 101 mmol/L (ref 98–111)
Creatinine, Ser: 1.26 mg/dL — ABNORMAL HIGH (ref 0.61–1.24)
GFR, Estimated: >60 mL/min (ref >60)
Glucose, Bld: 112 mg/dL — ABNORMAL HIGH (ref 70–99)
Potassium: 4.4 mmol/L (ref 3.5–5.1)
Sodium: 132 mmol/L — ABNORMAL LOW (ref 135–145)

## 2024-09-02 LAB — TYPE AND SCREEN
ABO/RH(D): O POS
Unit division: 0
Unit division: 0

## 2024-09-02 LAB — CBC
HCT: 27.6 % — ABNORMAL LOW (ref 39.0–52.0)
Hemoglobin: 8.8 g/dL — ABNORMAL LOW (ref 13.0–17.0)
MCH: 26.3 pg (ref 26.0–34.0)
MCHC: 31.9 g/dL (ref 30.0–36.0)
MCV: 82.6 fL (ref 80.0–100.0)
Platelets: 242 K/uL (ref 150–400)
RBC: 3.34 MIL/uL — ABNORMAL LOW (ref 4.22–5.81)
RDW: 16.2 % — ABNORMAL HIGH (ref 11.5–15.5)
WBC: 18 K/uL — ABNORMAL HIGH (ref 4.0–10.5)
nRBC: 0 % (ref 0.0–0.2)

## 2024-09-02 MED ORDER — SODIUM CHLORIDE 0.9 % IR SOLN
Status: DC
Start: 2024-09-02 — End: 2024-09-03
  Filled 2024-09-02 (×5): qty 30

## 2024-09-02 MED ORDER — SODIUM CHLORIDE 0.9 % IV SOLN
2.0000 g | Freq: Every day | INTRAVENOUS | Status: DC
  Administered 2024-09-02: 2 g via INTRAVENOUS
  Filled 2024-09-02: qty 20

## 2024-09-02 NOTE — Progress Notes (Signed)
 Patient ID: Erik Gill, male   DOB: 04-Feb-1961, 63 y.o.   MRN: [id number]    Subjective: Urine was clear overnight on minimal CBI although he did have what was likely a few old clots that required irrigation.   Objective: Vital signs in last 24 hours: Temp:  [97.9 F (36.6 C)-98.9 F (37.2 C)] 98.9 F (37.2 C) (09/30 0449) Pulse Rate:  [73-99] 80 (09/30 0449) Resp:  [18-21] 21 (09/30 0449) BP: (133-262)/(71-129) 153/79 (09/30 0449) SpO2:  [99 %-100 %] 100 % (09/30 0449) Weight:  [85.5 kg] 85.5 kg (09/29 2046)  Intake/Output from previous day: 09/29 0701 - 09/30 0700 In: 6028.3 [P.O.:480; I.V.:208.8; Blood:1079.5] Out: 6220 [Urine:6220] Intake/Output this shift: Total I/O In: -  Out: 650 [Urine:650]  Physical Exam:  General: Alert and oriented GU: Urine clear with minimal CBI.  I hand irrigated him without clots noted.  Lab Results: Recent Labs    09/01/24 1015 09/01/24 1657 09/02/24 0540  HGB 9.7* 9.1* 8.8*  HCT 31.9* 28.7* 27.6*   BMET Recent Labs    09/01/24 1015 09/02/24 0540  NA 133* 132*  K 4.5 4.4  CL 98 101  CO2 15* 19*  GLUCOSE 230* 112*  BUN 23 23  CREATININE 2.15* 1.26*  CALCIUM 9.9 9.3     Studies/Results: US  PELVIS LIMITED (TRANSABDOMINAL ONLY) Result Date: 09/01/2024 CLINICAL DATA:  Hematuria. EXAM: LIMITED ULTRASOUND OF PELVIS TECHNIQUE: Limited transabdominal ultrasound examination of the pelvis was performed. COMPARISON:  CT scan 08/22/2024 FINDINGS: Focused ultrasound was performed of the urinary bladder. Dependent debris is seen within the bladder. Bladder largely decompressed by Foley catheter. IMPRESSION: Limited study due to nondistention of the urinary bladder. There does appear to be some dependent debris within the bladder lumen. Correlation for bladder infection recommended. Electronically Signed   By: Camellia Candle M.D.   On: 09/01/2024 09:25    Assessment/Plan: 1) Hematuria due to radiation cystitis:  No active bleeding  currently and therefore no indication to go to the OR for fulguration.  His renal function is improved today and I think, since he has had so much trouble, he may benefit from 24 hrs of 1% alum bladder irrigation before discharge and restarting hyperbaric oxygen  therapy.  Hopefully, he would be ready for discharge tomorrow morning before his treatment at 11:30 AM.   LOS: 0 days   [name] 09/02/2024, 7:54 AM

## 2024-09-02 NOTE — Progress Notes (Signed)
  Subjective: Doing well, passed some old clots via Foley overnight. Urine remains clear yellow this AM. Hgb stable. Will plan for 24 hours of alum.  Objective: Vital signs in last 24 hours: Temp:  [97.9 F (36.6 C)-98.9 F (37.2 C)] 98.9 F (37.2 C) (09/30 0449) Pulse Rate:  [73-99] 80 (09/30 0449) Resp:  [18-21] 21 (09/30 0449) BP: (133-262)/(71-129) 153/79 (09/30 0449) SpO2:  [99 %-100 %] 100 % (09/30 0449) Weight:  [85.5 kg] 85.5 kg (09/29 2046)  Intake/Output from previous day: 09/29 0701 - 09/30 0700 In: 6028.3 [P.O.:480; I.V.:208.8; Blood:1079.5] Out: 6220 [Urine:6220] Intake/Output this shift: Total I/O In: -  Out: 650 [Urine:650]  Physical Exam:  General: Alert and oriented CV: RRR Lungs: Clear Abdomen: Soft, ND GU: Foley in place draining CYU with CBI clamped  Ext: NT, No erythema  Lab Results: Recent Labs    09/01/24 1015 09/01/24 1657 09/02/24 0540  HGB 9.7* 9.1* 8.8*  HCT 31.9* 28.7* 27.6*   BMET Recent Labs    09/01/24 1015 09/02/24 0540  NA 133* 132*  K 4.5 4.4  CL 98 101  CO2 15* 19*  GLUCOSE 230* 112*  BUN 23 23  CREATININE 2.15* 1.26*  CALCIUM 9.9 9.3     Studies/Results: US  PELVIS LIMITED (TRANSABDOMINAL ONLY) Result Date: 09/01/2024 CLINICAL DATA:  Hematuria. EXAM: LIMITED ULTRASOUND OF PELVIS TECHNIQUE: Limited transabdominal ultrasound examination of the pelvis was performed. COMPARISON:  CT scan 08/22/2024 FINDINGS: Focused ultrasound was performed of the urinary bladder. Dependent debris is seen within the bladder. Bladder largely decompressed by Foley catheter. IMPRESSION: Limited study due to nondistention of the urinary bladder. There does appear to be some dependent debris within the bladder lumen. Correlation for bladder infection recommended. Electronically Signed   By: Camellia Candle M.D.   On: 09/01/2024 09:25    Assessment/Plan: 63 y.o. 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   #Hematuria S/p aggressive irrigation and CBI, with urine running clear yellow -S/p TXA x1 -Plan for alum x24 hours  -If urine remains clear tomorrow morning, will plan for discharge in AM to allow for patient to make hyperbaric oxygen  appointment    LOS: 0 days   Lyle LOISE Civil 09/02/2024, 7:55 AM

## 2024-09-02 NOTE — Progress Notes (Signed)
 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  Urology is consulting, treated with aggressive hand irrigation with hydrogen peroxide to break up organized clot.  On CBI.  Given Cefazolin  x 1.  He was due to restart hyperbaric oxygen  today.  NPO for now pending reevaluation of urine this PM.  Assessment and Plan:  Hematuria, blocked Foley catheter, initial encounter Presented with gross hematuria, likely due to radiation cystitis and clot obstruction He has been undergoing hyperbaric oxygen  therapy (due for another treatment today, actually) Underwent placement of 3-way catheter for irrigation Urology consulting Underwent hand irrigation with sterile water  and hydrogen peroxide Started on CBI with plan to wean to light pink urine Given TXA x 1, previously on TID PO so may need to restart this Given cefazolin  x 1 Underwent an additional 2L sterile water  irrigation Restarted on CBI with plan to clamp overnight if urine remains clear  Fever Developed acute onset of fever to 101 today in the setting of foley Blood and urine cultures ordered Will start Rocephin  2 grams daily for now empirically   Acute on chronic anemia Hgb 7.9 on 9/26, normocytic and likely associated with chronic disease and/or ABLA Hgb 6.5 on presentation, likely due to ABLA Transfused 2 units  Hgb up to 9.7 post-transfusion and slowly trending back down to 9.1 and then 8.8 Recheck CBC in AM or sooner if he is having extensive ongoing blood loss  AKI Developed transient AKI during the hospitalization from obstructive uropathy This improved and is back to relative baseline Avoid nephrotoxic agents Recheck BMP in AM   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 (2023) Recurrent issues with hematuria and clot obstruction Chronic foley Followed by urology  Undergoing hyperbaric oxygen  therapy   Essential hypertension Resume Toprol  XL, amlodipine , benazepril     Tobacco abuse Encourage cessation Patch ordered         Consultants: Urology   Procedures: None   Antibiotics: Cefazolin  x 1 Ceftriaxone  9/30-  30 Day Unplanned Readmission Risk Score    Flowsheet Row ED to Hosp-Admission (Discharged) from 08/22/2024 in [facility]  30 Day Unplanned Readmission Risk Score (%) 17.12 Filed at 08/24/2024 1200    This score is the patient's risk of an unplanned readmission within 30 days of being discharged (0 -100%). The score is based on dignosis, age, lab data, medications, orders, and past utilization.   Low:  0-14.9   Medium: 15-21.9   High: 22-29.9   Extreme: 30 and above           Subjective: Feeling better this AM when I saw him but he developed fever to 101 this afternoon.   Objective: Vitals:   09/02/24 0835 09/02/24 1310  BP: (!) 148/65 137/74  Pulse: 79 74  Resp:  18  Temp:  (!) 101 F (38.3 C)  SpO2:  100%    Intake/Output Summary (Last 24 hours) at 09/02/2024 1547 Last data filed at 09/02/2024 1126 Gross per 24 hour  Intake 5548.83 ml  Output 5595 ml  Net -46.17 ml   Filed Weights   08/31/24 1827 09/01/24 2046  Weight: 88.5 kg 85.5 kg    Exam:  General:  Appears  calm and comfortable and is in NAD Eyes:  normal lids, iris ENT:  grossly normal hearing, lips & tongue, mmm Cardiovascular:  RRR. No LE edema.  Respiratory:   CTA bilaterally with no wheezes/rales/rhonchi.  Normal respiratory effort. Abdomen:  soft, NT, ND Skin:  no rash or induration seen on limited exam Musculoskeletal:  grossly normal tone BUE/BLE, good ROM, no bony abnormality Psychiatric:  blunted mood and affect, speech fluent and appropriate,  AOx3 Neurologic:  CN 2-12 grossly intact, moves all extremities in coordinated fashion  Data Reviewed: I have reviewed the patient's lab results since admission.  Pertinent labs for today include:   Na++ 132, not clinically significant CO2 19, improved Glucose 112 BUN 23/Creatinine 1.26/GFR >60, improved from 23/2.15/34 WBC 18 Hgb 8.8, down from 9.7 after transfusion     Family Communication: None present     Code Status: Full Code   Disposition: Status is: Observation The patient remains OBS appropriate and will d/c before 2 midnights.     Time spent: 50 minutes  Unresulted Labs (From admission, onward)     Start     Ordered   09/03/24 0500  CBC  Tomorrow morning,   R        09/02/24 0733   09/03/24 0500  Basic metabolic panel with GFR  Tomorrow morning,   R        09/02/24 0733   09/02/24 1400  Culture, blood (Routine X 2) w Reflex to ID Panel  BLOOD CULTURE X 2,   R (with TIMED occurrences)      09/02/24 1359   09/02/24 1359  Urine Culture (for pregnant, neutropenic or urologic patients or patients with an indwelling urinary catheter)  (Urine Labs)  Once,   R       Question:  Indication  Answer:  Sepsis   09/02/24 1359             Author: Delon Herald, MD 09/02/2024 3:47 PM  For on call review [email or web address].

## 2024-09-02 NOTE — Progress Notes (Signed)
   09/02/24 0220  Urethral Catheter Almarie Quirk, RN Latex 22 Fr.  Placement Date/Time: 08/31/24 2300   Inserted prior to hospital arrival?: No  Inserted prior to unit arrival?: No  Perineal care performed prior to insertion?: Yes  Person Inserting LDA: Almarie Quirk, RN  Person Assisting with Catheter Inse...  Input (mL) 30 mL  Output (mL) 50 mL  Urine Measurement/Characteristics  Urine Color Red  Urine Appearance Blood clots

## 2024-09-02 NOTE — Progress Notes (Signed)
   09/02/24 0410  Urethral Catheter Almarie Quirk, RN Latex 22 Fr.  Placement Date/Time: 08/31/24 2300   Inserted prior to hospital arrival?: No  Inserted prior to unit arrival?: No  Perineal care performed prior to insertion?: Yes  Person Inserting LDA: Almarie Quirk, RN  Person Assisting with Catheter Inse...  Input (mL) 30 mL  Output (mL)  (40)  Urine Measurement/Characteristics  Urine Color Red  Urine Appearance Blood clots

## 2024-09-03 DIAGNOSIS — D649 Anemia, unspecified: Secondary | ICD-10-CM | POA: Diagnosis not present

## 2024-09-03 LAB — BASIC METABOLIC PANEL WITH GFR
Anion gap: 10 (ref 5–15)
BUN: 22 mg/dL (ref 8–23)
CO2: 20 mmol/L — ABNORMAL LOW (ref 22–32)
Calcium: 8.9 mg/dL (ref 8.9–10.3)
Chloride: 101 mmol/L (ref 98–111)
Creatinine, Ser: 1.09 mg/dL (ref 0.61–1.24)
GFR, Estimated: >60 mL/min (ref >60)
Glucose, Bld: 107 mg/dL — ABNORMAL HIGH (ref 70–99)
Potassium: 4.3 mmol/L (ref 3.5–5.1)
Sodium: 131 mmol/L — ABNORMAL LOW (ref 135–145)

## 2024-09-03 LAB — CBC
HCT: 26.4 % — ABNORMAL LOW (ref 39.0–52.0)
Hemoglobin: 8.3 g/dL — ABNORMAL LOW (ref 13.0–17.0)
MCH: 26.2 pg (ref 26.0–34.0)
MCHC: 31.4 g/dL (ref 30.0–36.0)
MCV: 83.3 fL (ref 80.0–100.0)
Platelets: 271 K/uL (ref 150–400)
RBC: 3.17 MIL/uL — ABNORMAL LOW (ref 4.22–5.81)
RDW: 15.8 % — ABNORMAL HIGH (ref 11.5–15.5)
WBC: 15.8 K/uL — ABNORMAL HIGH (ref 4.0–10.5)
nRBC: 0 % (ref 0.0–0.2)

## 2024-09-03 NOTE — Progress Notes (Signed)
 Patient ID: Erik Gill, male   DOB: 22-Nov-1961, 63 y.o.   MRN: [id number]    Subjective: Pt without hematuria or clots over last 24 hrs.  S/P about 24 hrs of alum irrigation.  No complications.  Objective: Vital signs in last 24 hours: Temp:  [98.7 F (37.1 C)-101 F (38.3 C)] 98.8 F (37.1 C) (10/01 0541) Pulse Rate:  [68-79] 68 (10/01 0541) Resp:  [18] 18 (10/01 0541) BP: (137-148)/(65-74) 144/72 (10/01 0541) SpO2:  [100 %] 100 % (10/01 0541)  Intake/Output from previous day: 09/30 0701 - 10/01 0700 In: 4720 [P.O.:720; IV Piggyback:100] Out: 7475 [Urine:7475] Intake/Output this shift: No intake/output data recorded.  Physical Exam:  General: Alert and oriented GU: Urine clear  Lab Results: Recent Labs    09/01/24 1657 09/02/24 0540 09/03/24 0459  HGB 9.1* 8.8* 8.3*  HCT 28.7* 27.6* 26.4*   BMET Recent Labs    09/02/24 0540 09/03/24 0459  NA 132* 131*  K 4.4 4.3  CL 101 101  CO2 19* 20*  GLUCOSE 112* 107*  BUN 23 22  CREATININE 1.26* 1.09  CALCIUM 9.3 8.9     Studies/Results:   Assessment/Plan: 1) Hematuria due to radiation cystitis:  Will allow current bag of alum irrigation to finish and then will stop and d/c catheter.  He has no active bleeding at this time and can be discharged today after catheter removal.  I have talked with Daryl from the wound center and he can resume hyperbaric oxygen  treatments tomorrow.  He has f/u with me on Friday.     LOS: 1 day   [name] 09/03/2024, 7:26 AM

## 2024-09-03 NOTE — Progress Notes (Signed)
  Subjective: Urine remains clear, alum run for 24 hours. Had low grade fever yesterday afternoon to 101. Cultures obtained. Reassuringly, WBC downtrending. Hgb stable. Cr at baseline.   Objective: Vital signs in last 24 hours: Temp:  [98.7 F (37.1 C)-101 F (38.3 C)] 98.8 F (37.1 C) (10/01 0541) Pulse Rate:  [68-79] 68 (10/01 0541) Resp:  [18] 18 (10/01 0541) BP: (137-148)/(65-74) 144/72 (10/01 0541) SpO2:  [100 %] 100 % (10/01 0541)  Intake/Output from previous day: 09/30 0701 - 10/01 0700 In: 4720 [P.O.:720; IV Piggyback:100] Out: 7475 [Urine:7475] Intake/Output this shift: No intake/output data recorded.  Physical Exam:  General: Alert and oriented CV: RRR Lungs: Clear Abdomen: Soft, ND GU: Foley in place draining CYU with CBI clamped  Ext: NT, No erythema  Lab Results: Recent Labs    09/01/24 1657 09/02/24 0540 09/03/24 0459  HGB 9.1* 8.8* 8.3*  HCT 28.7* 27.6* 26.4*   BMET Recent Labs    09/02/24 0540 09/03/24 0459  NA 132* 131*  K 4.4 4.3  CL 101 101  CO2 19* 20*  GLUCOSE 112* 107*  BUN 23 22  CREATININE 1.26* 1.09  CALCIUM 9.3 8.9     Studies/Results: US  PELVIS LIMITED (TRANSABDOMINAL ONLY) Result Date: 09/01/2024 CLINICAL DATA:  Hematuria. EXAM: LIMITED ULTRASOUND OF PELVIS TECHNIQUE: Limited transabdominal ultrasound examination of the pelvis was performed. COMPARISON:  CT scan 08/22/2024 FINDINGS: Focused ultrasound was performed of the urinary bladder. Dependent debris is seen within the bladder. Bladder largely decompressed by Foley catheter. IMPRESSION: Limited study due to nondistention of the urinary bladder. There does appear to be some dependent debris within the bladder lumen. Correlation for bladder infection recommended. Electronically Signed   By: Camellia Candle M.D.   On: 09/01/2024 09:25    Assessment/Plan: 63 y.o. 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   #Hematuria S/p aggressive irrigation and CBI, with urine running clear yellow -Okay for trial of void after completion of last alum bag.  -Will plan to resume hyperbaric oxygen  treatment tomorrow.   #Low grade fever -In setting of aggressive irrigation over last several days, recommend home with empiric antibiotics for total 10 day course. Follow-up urine culture to ensure susceptibility. If negative, can stop antibiotics.    LOS: 1 day   Lyle LOISE Civil 09/03/2024, 7:25 AM

## 2024-09-03 NOTE — Discharge Summary (Signed)
 Physician Discharge Summary  Royalty Domagala FMW:[id number] DOB: 09-Apr-1961 DOA: 08/31/2024  PCP: Arloa Elsie SAUNDERS, MD  Admit date: 08/31/2024 Discharge date: 09/03/2024  Admitted From: Home Disposition: Home  Recommendations for Outpatient Follow-up:  Follow up with PCP in 1-2 weeks Follow-up with urology as scheduled  Home Health: None Equipment/Devices: None  Discharge Condition: Stable CODE STATUS: Full Diet recommendation: Low-salt low-fat diet  Brief/Interim Summary: 63yo with h/o HTN, HLD, prostate CA s/p prostatectomy and radiation (2023) who presented on 9/28 with gross hematuria and 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  Urology is consulting, treated with aggressive hand irrigation with hydrogen peroxide to break up organized clot.  Initially placed on CBI with improvement, Foley catheter removed 09/03/2024, now urinating without difficulty.  Urology recommending outpatient follow-up.  Patient to resume hyperbaric oxygen  on 09/04/2024.    Discharge Diagnoses:  Principal Problem:   Symptomatic anemia Active Problems:   Complication, blocked Foley catheter, initial encounter   Acute on chronic anemia   Malignant neoplasm of prostate (HCC)   Essential hypertension   Tobacco abuse   Urinary retention   Hematuria  Hematuria, blocked Foley catheter, initial encounter Presented with gross hematuria, likely due to radiation cystitis and clot obstruction He has been undergoing hyperbaric oxygen  therapy in the outpatient setting Underwent placement of 3-way catheter for irrigation with urology -underwent hand irrigation, hydrogen peroxide with TXA x1 Tolerated CBI overnight, Foley withdrawn today and urinating without difficulty Single low-grade fever overnight 9/30 without recurrence -continue to follow clinically -likely reactive given above  Acute on chronic anemia Hgb 7.9 on 9/26, normocytic and likely associated with chronic disease and/or ABLA Hgb 6.5 on  presentation, likely due to ABLA Hemoglobin stable around 8 after 2 units PRBC -no further signs or symptoms of gross blood loss   AKI, resolved Likely secondary to above   Malignant neoplasm of prostate  S/p prostatectomy and radiation (2023) Recurrent issues with hematuria and clot obstruction Continue hyperbaric oxygen  therapy on 10/2, follow-up with urology as scheduled   Essential hypertension Resume Toprol  XL, amlodipine , benazepril     Tobacco abuse Encourage cessation Patch ordered  Discharge Instructions  Discharge Instructions     Call MD for:   Complete by: As directed    Difficulty urinating, worsening hematuria(blood in the urine)   Call MD for:  difficulty breathing, headache or visual disturbances   Complete by: As directed    Call MD for:  extreme fatigue   Complete by: As directed    Call MD for:  hives   Complete by: As directed    Call MD for:  persistant dizziness or light-headedness   Complete by: As directed    Call MD for:  persistant nausea and vomiting   Complete by: As directed    Call MD for:  severe uncontrolled pain   Complete by: As directed    Call MD for:  temperature >100.4   Complete by: As directed    Diet - low sodium heart healthy   Complete by: As directed    Discharge instructions   Complete by: As directed    Follow up with Urology on Friday 10/3 as scheduled   Increase activity slowly   Complete by: As directed       Allergies as of 09/03/2024       Reactions   Other    Seasonal Allergies that are year round        Medication List     TAKE these medications    acetaminophen   500 MG tablet Commonly known as: TYLENOL  Take 500 mg by mouth every 6 (six) hours as needed.   albuterol  108 (90 Base) MCG/ACT inhaler Commonly known as: VENTOLIN  HFA Inhale 2 puffs into the lungs every 4 (four) hours as needed for wheezing or shortness of breath.   amLODipine -benazepril  10-40 MG capsule Commonly known as: LOTREL Take 1  capsule by mouth in the morning.   ferrous sulfate  325 (65 FE) MG EC tablet Take 325 mg by mouth daily with breakfast.   ferrous sulfate  325 (65 FE) MG tablet Take 1 tablet (325 mg total) by mouth daily.   fluticasone  50 MCG/ACT nasal spray Commonly known as: FLONASE  Place 1 spray into both nostrils daily for 3 days. What changed: when to take this   Lidocaine  4 % Gel Apply small amount to urethral opening twice daily as needed for irritation from foley catheter   metoprolol  succinate 100 MG 24 hr tablet Commonly known as: TOPROL -XL Take 100 mg by mouth in the morning. What changed: how much to take   nicotine  14 mg/24hr patch Commonly known as: NICODERM CQ  - dosed in mg/24 hours Place 1 patch (14 mg total) onto the skin daily. What changed:  when to take this reasons to take this   ofloxacin 0.3 % OTIC solution Commonly known as: FLOXIN Place 5 drops into both ears 2 (two) times daily.   ondansetron  4 MG tablet Commonly known as: ZOFRAN  Take 1 tablet (4 mg total) by mouth every 6 (six) hours as needed for nausea.   Opcon-A  0.027-0.315 % Soln Generic drug: Naphazoline-Pheniramine Place 1 drop into both eyes daily as needed (allergies).   pantoprazole  40 MG tablet Commonly known as: PROTONIX  Take 40 mg by mouth daily as needed.   tranexamic acid  650 MG Tabs tablet Commonly known as: LYSTEDA  Take 1,300 mg by mouth 3 (three) times daily.   ZyrTEC Allergy 10 MG tablet Generic drug: cetirizine Take 10 mg by mouth in the morning.        Allergies  Allergen Reactions   Other     Seasonal Allergies that are year round    Consultations: Urology   Procedures/Studies: US  PELVIS LIMITED (TRANSABDOMINAL ONLY) Result Date: 09/01/2024 CLINICAL DATA:  Hematuria. EXAM: LIMITED ULTRASOUND OF PELVIS TECHNIQUE: Limited transabdominal ultrasound examination of the pelvis was performed. COMPARISON:  CT scan 08/22/2024 FINDINGS: Focused ultrasound was performed of the  urinary bladder. Dependent debris is seen within the bladder. Bladder largely decompressed by Foley catheter. IMPRESSION: Limited study due to nondistention of the urinary bladder. There does appear to be some dependent debris within the bladder lumen. Correlation for bladder infection recommended. Electronically Signed   By: Camellia Candle M.D.   On: 09/01/2024 09:25   CT ABDOMEN PELVIS W CONTRAST Result Date: 08/22/2024 EXAM: CT ABDOMEN AND PELVIS WITH CONTRAST 08/22/2024 09:22:57 PM TECHNIQUE: CT of the abdomen and pelvis was performed with the administration of intravenous contrast. Multiplanar reformatted images are provided for review. Automated exposure control, iterative reconstruction, and/or weight-based adjustment of the mA/kV was utilized to reduce the radiation dose to as low as reasonably achievable. COMPARISON: 08/04/2024 CLINICAL HISTORY: Hematuria, gross/macroscopic. FINDINGS: LOWER CHEST: No acute abnormality. LIVER: The liver is unremarkable. GALLBLADDER AND BILE DUCTS: Gallbladder is decompressed. No biliary ductal dilatation. SPLEEN: No acute abnormality. PANCREAS: No acute abnormality. ADRENAL GLANDS: No acute abnormality. KIDNEYS, URETERS AND BLADDER: No stones in the kidneys or ureters. No hydronephrosis. No perinephric or periureteral stranding. Mildly thick walled/irregular bladder with indwelling Foley catheter.  GI AND BOWEL: Normal appendix (image 56). Stomach demonstrates no acute abnormality. There is no bowel obstruction. PERITONEUM AND RETROPERITONEUM: No ascites. No free air. Small mildly complex fat-containing midline supraumbilical hernia (sagittal image 107), without associated inflammatory changes. VASCULATURE: Atherosclerotic calcifications of the abdominal aorta and branch vessels. LYMPH NODES: No lymphadenopathy. REPRODUCTIVE ORGANS: Suspected prior prostatectomy. BONES AND SOFT TISSUES: Mild degenerative changes of the visualized thoracolumbar spine. No focal soft tissue  abnormality. IMPRESSION: 1. Mildly thick walled/irregular bladder with indwelling Foley catheter. While this appearance may be related to radiation changes, primary bladder tumor is not excluded. 2. Suspected prior prostatectomy. No findings suspicious for metastatic disease. Electronically signed by: Pinkie Pebbles MD 08/22/2024 09:27 PM EDT RP Workstation: [id number]     Subjective: No acute issues/events overnight - tolerated foley removal this morning - urinating well.   Discharge Exam: Vitals:   09/03/24 0940 09/03/24 1350  BP: (!) 161/81 (!) 151/85  Pulse: 80 63  Resp: 20 20  Temp: 98.4 F (36.9 C) 97.6 F (36.4 C)  SpO2: 100% 100%   Vitals:   09/03/24 0541 09/03/24 0933 09/03/24 0940 09/03/24 1350  BP: (!) 144/72 (!) 161/81 (!) 161/81 (!) 151/85  Pulse: 68 78 80 63  Resp: 18  20 20   Temp: 98.8 F (37.1 C)  98.4 F (36.9 C) 97.6 F (36.4 C)  TempSrc: Oral  Oral   SpO2: 100%  100% 100%  Weight:      Height:        General: Pt is alert, awake, not in acute distress Cardiovascular: RRR, S1/S2 +, no rubs, no gallops Respiratory: CTA bilaterally, no wheezing, no rhonchi Abdominal: Soft, NT, ND, bowel sounds + Extremities: no edema, no cyanosis    The results of significant diagnostics from this hospitalization (including imaging, microbiology, ancillary and laboratory) are listed below for reference.     Microbiology: Recent Results (from the past 240 hours)  Culture, blood (Routine X 2) w Reflex to ID Panel     Status: None (Preliminary result)   Collection Time: 09/02/24  2:15 PM   Specimen: BLOOD LEFT ARM  Result Value Ref Range Status   Specimen Description   Final    BLOOD LEFT ARM Performed at [facility], [address], [place], [place] [address]    Special Requests   Final    BOTTLES DRAWN AEROBIC AND ANAEROBIC Blood Culture adequate volume Performed at [facility], [address], [place], [place] [address]    Culture    Final    NO GROWTH < 24 HOURS Performed at [facility], [address], [place], [place] [address]    Report Status PENDING  Incomplete  Culture, blood (Routine X 2) w Reflex to ID Panel     Status: None (Preliminary result)   Collection Time: 09/02/24  2:15 PM   Specimen: BLOOD LEFT HAND  Result Value Ref Range Status   Specimen Description   Final    BLOOD LEFT HAND Performed at [facility], [address] [place], [place] [address]    Special Requests   Final    BOTTLES DRAWN AEROBIC AND ANAEROBIC Blood Culture adequate volume Performed at [facility], [address], [place], [place] [address]    Culture   Final    NO GROWTH < 24 HOURS Performed at [facility], [address] [place], [place] [address]    Report Status PENDING  Incomplete  Labs: BNP (last 3 results) No results for input(s): BNP in the last 8760 hours. Basic Metabolic Panel: Recent Labs  Lab 08/29/24 0207 08/31/24 1935 09/01/24 1015 09/02/24 0540 09/03/24 0459  NA 129* 132* 133* 132* 131*  K 4.6 4.3 4.5 4.4 4.3  CL 97* 100 98 101 101  CO2 18* 21* 15* 19* 20*  GLUCOSE 123* 105* 230* 112* 107*  BUN 14 21 23 23 22   CREATININE 1.21 1.23 2.15* 1.26* 1.09  CALCIUM 10.1 9.2 9.9 9.3 8.9  MG  --   --  2.3  --   --   PHOS  --   --  4.9*  --   --    Liver Function Tests: Recent Labs  Lab 09/01/24 1015 09/01/24 1016  AST 24 22  ALT 14 15  ALKPHOS 88 88  BILITOT 1.0 1.0  PROT 7.4 7.4  ALBUMIN  4.7 4.7   No results for input(s): LIPASE, AMYLASE in the last 168 hours. No results for input(s): AMMONIA in the last 168 hours. CBC: Recent Labs  Lab 08/29/24 0207 08/31/24 1935 09/01/24 1015 09/01/24 1657 09/02/24 0540 09/03/24 0459  WBC 12.7* 8.0 14.8* 18.2* 18.0* 15.8*  NEUTROABS 10.9* 6.2  --   --   --   --   HGB 7.9* 6.5* 9.7* 9.1* 8.8* 8.3*  HCT 26.3* 22.0* 31.9* 28.7* 27.6* 26.4*  MCV 82.2 86.3 85.1 82.9 82.6 83.3  PLT 342 287 330 253  242 271   Cardiac Enzymes: Recent Labs  Lab 09/01/24 1016  CKTOTAL 242   BNP: Invalid input(s): POCBNP CBG: No results for input(s): GLUCAP in the last 168 hours. D-Dimer No results for input(s): DDIMER in the last 72 hours. Hgb A1c No results for input(s): HGBA1C in the last 72 hours. Lipid Profile No results for input(s): CHOL, HDL, LDLCALC, TRIG, CHOLHDL, LDLDIRECT in the last 72 hours. Thyroid function studies No results for input(s): TSH, T4TOTAL, T3FREE, THYROIDAB in the last 72 hours.  Invalid input(s): FREET3 Anemia work up Recent Labs    09/01/24 1015  VITAMINB12 681  FOLATE 12.2  FERRITIN 19*  TIBC 462*  IRON 34*  RETICCTPCT 4.0*   Urinalysis    Component Value Date/Time   COLORURINE RED (A) 08/29/2024 0741   APPEARANCEUR TURBID (A) 08/29/2024 0741   LABSPEC 1.005 08/29/2024 0741   PHURINE 7.0 08/29/2024 0741   GLUCOSEU 50 (A) 08/29/2024 0741   HGBUR MODERATE (A) 08/29/2024 0741   BILIRUBINUR NEGATIVE 08/29/2024 0741   BILIRUBINUR moderate (A) 02/19/2024 1149   KETONESUR NEGATIVE 08/29/2024 0741   PROTEINUR 100 (A) 08/29/2024 0741   UROBILINOGEN 1.0 02/19/2024 1149   NITRITE NEGATIVE 08/29/2024 0741   LEUKOCYTESUR NEGATIVE 08/29/2024 0741   Sepsis Labs Recent Labs  Lab 09/01/24 1015 09/01/24 1657 09/02/24 0540 09/03/24 0459  WBC 14.8* 18.2* 18.0* 15.8*   Microbiology Recent Results (from the past 240 hours)  Culture, blood (Routine X 2) w Reflex to ID Panel     Status: None (Preliminary result)   Collection Time: 09/02/24  2:15 PM   Specimen: BLOOD LEFT ARM  Result Value Ref Range Status   Specimen Description   Final    BLOOD LEFT ARM Performed at [facility], [address] [place], [place] [address]    Special Requests   Final    BOTTLES DRAWN AEROBIC AND ANAEROBIC Blood Culture adequate volume Performed at [facility], [address], [place], [place] [address]    Culture  Final    NO GROWTH < 24 HOURS Performed at [facility], [address], [place], [place] [address]    Report Status PENDING  Incomplete  Culture, blood (Routine X 2) w Reflex to ID Panel     Status: None (Preliminary result)   Collection Time: 09/02/24  2:15 PM   Specimen: BLOOD LEFT HAND  Result Value Ref Range Status   Specimen Description   Final    BLOOD LEFT HAND Performed at [facility], [address] [place], [place] [address]    Special Requests   Final    BOTTLES DRAWN AEROBIC AND ANAEROBIC Blood Culture adequate volume Performed at [facility], [address] [place], [place] [address]    Culture   Final    NO GROWTH < 24 HOURS Performed at [facility], [address], [place], [place] [address]    Report Status PENDING  Incomplete     Time coordinating discharge: Over 30 minutes  SIGNED:   Elsie JAYSON Montclair, DO [facility] 09/03/2024, 3:21 PM Pager   If 7PM-7AM, please contact night-coverage [email or web address]

## 2024-09-03 NOTE — TOC Transition Note (Signed)
 Transition of Care [facility]) - Discharge Note   Patient Details  Name: Erik Gill MRN: [id number] Date of Birth: May 20, 1961  Transition of [facility]) CM/SW Contact:  Bascom Service, RN Phone Number: 09/03/2024, 3:28 PM   Clinical Narrative: d/c home No CM needs.      Final next level of care: Home/Self Care Barriers to Discharge: No Barriers Identified   Patient Goals and CMS Choice Patient states their goals for this hospitalization and ongoing recovery are:: Home CMS Medicare.gov Compare Post Acute Care list provided to:: Patient Choice offered to / list presented to : Patient [place] ownership interest in [facility].provided to:: Patient    Discharge Placement                       Discharge Plan and Services Additional resources added to the After Visit Summary for                                       Social Drivers of Health (SDOH) Interventions SDOH Screenings   Food Insecurity: No Food Insecurity (08/31/2024)  Housing: Low Risk  (08/31/2024)  Transportation Needs: No Transportation Needs (08/31/2024)  Utilities: Not At Risk (08/31/2024)  Depression (PHQ2-9): Low Risk  (01/18/2019)  Social Connections: Moderately Integrated (08/31/2024)  Tobacco Use: High Risk (09/02/2024)     Readmission Risk Interventions     No data to display

## 2024-09-04 ENCOUNTER — Encounter (HOSPITAL_BASED_OUTPATIENT_CLINIC_OR_DEPARTMENT_OTHER): Attending: Internal Medicine | Admitting: Internal Medicine

## 2024-09-04 ENCOUNTER — Telehealth: Payer: Self-pay | Admitting: Urology

## 2024-09-04 DIAGNOSIS — Z9079 Acquired absence of other genital organ(s): Secondary | ICD-10-CM | POA: Insufficient documentation

## 2024-09-04 DIAGNOSIS — C61 Malignant neoplasm of prostate: Secondary | ICD-10-CM

## 2024-09-04 DIAGNOSIS — Z923 Personal history of irradiation: Secondary | ICD-10-CM | POA: Insufficient documentation

## 2024-09-04 DIAGNOSIS — Z8546 Personal history of malignant neoplasm of prostate: Secondary | ICD-10-CM | POA: Insufficient documentation

## 2024-09-04 DIAGNOSIS — N3041 Irradiation cystitis with hematuria: Secondary | ICD-10-CM | POA: Insufficient documentation

## 2024-09-04 LAB — URINE CULTURE: Culture: 70000 — AB

## 2024-09-04 MED ORDER — CIPROFLOXACIN HCL 500 MG PO TABS
500.0000 mg | ORAL_TABLET | Freq: Two times a day (BID) | ORAL | 0 refills | Status: DC
Start: 2024-09-04 — End: 2024-09-09

## 2024-09-04 NOTE — Telephone Encounter (Signed)
 Urine culture from hospitalization positive. Sending in Ciprofloxacin  for 10 days. Called patient to inform him to pick up antibiotic.   Regarding his hematuria, patient endorses passing some light pink urine with small clots but feels like he is emptying. Does have some dysuria, no recurrent fevers.   He was appreciative of phone call.   Lyle GEANNIE Civil, MD Resident, PGY4 Department of Urology

## 2024-09-05 ENCOUNTER — Encounter (HOSPITAL_BASED_OUTPATIENT_CLINIC_OR_DEPARTMENT_OTHER): Admitting: Internal Medicine

## 2024-09-05 DIAGNOSIS — N3041 Irradiation cystitis with hematuria: Secondary | ICD-10-CM | POA: Diagnosis not present

## 2024-09-05 DIAGNOSIS — R319 Hematuria, unspecified: Secondary | ICD-10-CM | POA: Diagnosis not present

## 2024-09-05 DIAGNOSIS — C61 Malignant neoplasm of prostate: Secondary | ICD-10-CM

## 2024-09-06 ENCOUNTER — Inpatient Hospital Stay (HOSPITAL_COMMUNITY)
Admission: EM | Admit: 2024-09-06 | Discharge: 2024-09-09 | DRG: 663 | Disposition: A | Discharge status: Home / Self Care | Attending: Internal Medicine | Admitting: Internal Medicine

## 2024-09-06 ENCOUNTER — Other Ambulatory Visit: Payer: Self-pay

## 2024-09-06 ENCOUNTER — Encounter (HOSPITAL_COMMUNITY): Payer: Self-pay

## 2024-09-06 DIAGNOSIS — N3041 Irradiation cystitis with hematuria: Secondary | ICD-10-CM | POA: Diagnosis not present

## 2024-09-06 DIAGNOSIS — R310 Gross hematuria: Secondary | ICD-10-CM | POA: Diagnosis present

## 2024-09-06 DIAGNOSIS — D649 Anemia, unspecified: Secondary | ICD-10-CM

## 2024-09-06 DIAGNOSIS — K219 Gastro-esophageal reflux disease without esophagitis: Secondary | ICD-10-CM | POA: Diagnosis present

## 2024-09-06 DIAGNOSIS — Z79899 Other long term (current) drug therapy: Secondary | ICD-10-CM

## 2024-09-06 DIAGNOSIS — D638 Anemia in other chronic diseases classified elsewhere: Secondary | ICD-10-CM | POA: Diagnosis present

## 2024-09-06 DIAGNOSIS — Z800 Family history of malignant neoplasm of digestive organs: Secondary | ICD-10-CM

## 2024-09-06 DIAGNOSIS — Y842 Radiological procedure and radiotherapy as the cause of abnormal reaction of the patient, or of later complication, without mention of misadventure at the time of the procedure: Secondary | ICD-10-CM | POA: Diagnosis present

## 2024-09-06 DIAGNOSIS — Z9079 Acquired absence of other genital organ(s): Secondary | ICD-10-CM

## 2024-09-06 DIAGNOSIS — R319 Hematuria, unspecified: Principal | ICD-10-CM | POA: Diagnosis present

## 2024-09-06 DIAGNOSIS — I10 Essential (primary) hypertension: Secondary | ICD-10-CM | POA: Diagnosis present

## 2024-09-06 DIAGNOSIS — F17210 Nicotine dependence, cigarettes, uncomplicated: Secondary | ICD-10-CM | POA: Diagnosis present

## 2024-09-06 DIAGNOSIS — Z8042 Family history of malignant neoplasm of prostate: Secondary | ICD-10-CM

## 2024-09-06 DIAGNOSIS — Z923 Personal history of irradiation: Secondary | ICD-10-CM

## 2024-09-06 DIAGNOSIS — D62 Acute posthemorrhagic anemia: Secondary | ICD-10-CM | POA: Diagnosis present

## 2024-09-06 DIAGNOSIS — Z8546 Personal history of malignant neoplasm of prostate: Secondary | ICD-10-CM

## 2024-09-06 LAB — CBC WITH DIFFERENTIAL/PLATELET
Abs Immature Granulocytes: 0.17 K/uL — ABNORMAL HIGH (ref 0.00–0.07)
Basophils Absolute: 0.1 K/uL (ref 0.0–0.1)
Basophils Relative: 1 %
Eosinophils Absolute: 0.2 K/uL (ref 0.0–0.5)
Eosinophils Relative: 1 %
HCT: 20.8 % — ABNORMAL LOW (ref 39.0–52.0)
Hemoglobin: 6.2 g/dL — CL (ref 13.0–17.0)
Immature Granulocytes: 1 %
Lymphocytes Relative: 8 %
Lymphs Abs: 1.1 K/uL (ref 0.7–4.0)
MCH: 24.8 pg — ABNORMAL LOW (ref 26.0–34.0)
MCHC: 29.8 g/dL — ABNORMAL LOW (ref 30.0–36.0)
MCV: 83.2 fL (ref 80.0–100.0)
Monocytes Absolute: 1.2 K/uL — ABNORMAL HIGH (ref 0.1–1.0)
Monocytes Relative: 8 %
Neutro Abs: 11.4 K/uL — ABNORMAL HIGH (ref 1.7–7.7)
Neutrophils Relative %: 81 %
Platelets: 456 K/uL — ABNORMAL HIGH (ref 150–400)
RBC: 2.5 MIL/uL — ABNORMAL LOW (ref 4.22–5.81)
RDW: 16.2 % — ABNORMAL HIGH (ref 11.5–15.5)
WBC: 14.1 K/uL — ABNORMAL HIGH (ref 4.0–10.5)
nRBC: 0 % (ref 0.0–0.2)

## 2024-09-06 LAB — BASIC METABOLIC PANEL WITH GFR
Anion gap: 12 (ref 5–15)
BUN: 25 mg/dL — ABNORMAL HIGH (ref 8–23)
CO2: 20 mmol/L — ABNORMAL LOW (ref 22–32)
Calcium: 9.3 mg/dL (ref 8.9–10.3)
Chloride: 103 mmol/L (ref 98–111)
Creatinine, Ser: 1.74 mg/dL — ABNORMAL HIGH (ref 0.61–1.24)
GFR, Estimated: 44 mL/min — ABNORMAL LOW (ref >60)
Glucose, Bld: 111 mg/dL — ABNORMAL HIGH (ref 70–99)
Potassium: 4.5 mmol/L (ref 3.5–5.1)
Sodium: 135 mmol/L (ref 135–145)

## 2024-09-06 MED ORDER — TRANEXAMIC ACID-NACL 1000-0.7 MG/100ML-% IV SOLN
1000.0000 mg | INTRAVENOUS | Status: AC
  Administered 2024-09-06: 1000 mg via INTRAVENOUS
  Filled 2024-09-06: qty 100

## 2024-09-06 MED ORDER — SODIUM CHLORIDE 0.9 % IV BOLUS
1000.0000 mL | Freq: Once | INTRAVENOUS | Status: AC
  Administered 2024-09-06: 1000 mL via INTRAVENOUS

## 2024-09-06 NOTE — ED Provider Notes (Signed)
  Provider Note MRN:  [id number]  Arrival date & time: 09/07/24    ED Course and Medical Decision Making  Assumed care of patient at sign-out or upon transfer.  Recent admission for CBI related to radiation induced bleeding.  Having some return of bleeding and clots and retention this evening.  Catheter was manually flushed and now patient is freely draining light pink urine.  Urology consulted with Dr. Cam providing recommendations.  If still having free drainage of light pink urine in a couple hours, patient can be discharged leaving the catheter in place.  If he gets worse with return of heavy bleeding or retention, would reconsult Dr. Cam for readmission.  12:30 AM update: Patient's hemoglobin is dropped down to 6.2.  Has required blood transfusions in the past.  On my reassessment, patient's urine is dark red with clots, seems to be worsening compared to the description from day team.  And so we will admit to medicine, will inform Dr. Cam.  Starting blood transfusion, CBI.  .Critical Care  Performed by: Theadore Ozell HERO, MD Authorized by: Theadore Ozell HERO, MD   Critical care provider statement:    Critical care time (minutes):  35   Critical care was necessary to treat or prevent imminent or life-threatening deterioration of the following conditions: Symptomatic anemia requiring blood transfusion.   Critical care was time spent personally by me on the following activities:  Development of treatment plan with patient or surrogate, discussions with consultants, evaluation of patient's response to treatment, examination of patient, ordering and review of laboratory studies, ordering and review of radiographic studies, ordering and performing treatments and interventions, pulse oximetry, re-evaluation of patient's condition and review of old charts   Final Clinical Impressions(s) / ED Diagnoses     ICD-10-CM   1. Hematuria, unspecified type  R31.9     2. Symptomatic anemia  D64.9        ED Discharge Orders     None       Discharge Instructions   None     Ozell HERO. Theadore, MD [facility] Emergency Medicine [facility] [email or web address]    Theadore Ozell HERO, MD 09/07/24 [phone]

## 2024-09-06 NOTE — ED Triage Notes (Signed)
 Pt presents via POV c/o clogged urinary catheter. Reports recent prostate procedure. Reports catheter exchanged on Friday. Reports no urinary output in the last several hours.

## 2024-09-06 NOTE — ED Provider Notes (Signed)
 [place] EMERGENCY DEPARTMENT AT [facility] Provider Note   CSN: [id number] Arrival date & time: 09/06/24  2158     Patient presents with: No chief complaint on file.   Erik Gill is a 63 y.o. male history of hypertension, bladder cancer s/p radiation here presenting with hematuria.  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  Patient had a three-way catheter placed.  Patient eventually had continuous bladder irrigation and eventually required aluminum through the catheter.  Patient was discharged from the hospital 3 days ago.  He states that this afternoon he started having some blood clots in his catheter.  He states that he noticed some clots passed and then he had bladder spasm and noticed that he has worsening pain and the catheter stopped draining.   The history is provided by the patient.       Prior to Admission medications   Medication Sig Start Date End Date Taking? Authorizing Provider  acetaminophen  (TYLENOL ) 500 MG tablet Take 500 mg by mouth every 6 (six) hours as needed.    [provider]  albuterol  (VENTOLIN  HFA) 108 (90 Base) MCG/ACT inhaler Inhale 2 puffs into the lungs every 4 (four) hours as needed for wheezing or shortness of breath. 06/20/24   Arloa Suzen RAMAN, NP  amLODipine -benazepril  (LOTREL) 10-40 MG capsule Take 1 capsule by mouth in the morning. 09/28/16   [provider]  cetirizine (ZYRTEC ALLERGY) 10 MG tablet Take 10 mg by mouth in the morning.    [provider]  ciprofloxacin  (CIPRO ) 500 MG tablet Take 1 tablet (500 mg total) by mouth 2 (two) times daily for 10 days. 09/04/24 09/14/24  Spratte, Lyle SAILOR, MD  ferrous sulfate  325 (65 FE) MG EC tablet Take 325 mg by mouth daily with breakfast.    [provider]  ferrous sulfate  325 (65 FE) MG tablet Take 1 tablet (325 mg total) by mouth daily. 04/11/24 08/22/24  Cottie Donnice PARAS, MD  fluticasone  (FLONASE ) 50 MCG/ACT  nasal spray Place 1 spray into both nostrils daily for 3 days. Patient taking differently: Place 1 spray into both nostrils in the morning. 05/19/22 08/22/24  Hazen Darryle BRAVO, FNP  Lidocaine  4 % GEL Apply small amount to urethral opening twice daily as needed for irritation from foley catheter 08/29/24   Logan Ubaldo NOVAK, PA-C  metoprolol  succinate (TOPROL -XL) 100 MG 24 hr tablet Take 100 mg by mouth in the morning. Patient taking differently: Take 150 mg by mouth in the morning. 08/21/22   [provider]  Naphazoline-Pheniramine (OPCON-A ) 0.027-0.315 % SOLN Place 1 drop into both eyes daily as needed (allergies).    [provider]  nicotine  (NICODERM CQ  - DOSED IN MG/24 HOURS) 14 mg/24hr patch Place 1 patch (14 mg total) onto the skin daily. Patient taking differently: Place 14 mg onto the skin daily as needed. 08/20/24   Floretta Mallard, MD  ofloxacin (FLOXIN) 0.3 % OTIC solution Place 5 drops into both ears 2 (two) times daily. 08/26/24   [provider]  ondansetron  (ZOFRAN ) 4 MG tablet Take 1 tablet (4 mg total) by mouth every 6 (six) hours as needed for nausea. 08/24/24   Sherrill Cable Latif, DO  pantoprazole  (PROTONIX ) 40 MG tablet Take 40 mg by mouth daily as needed. 04/21/24   [provider]  tranexamic acid  (LYSTEDA ) 650 MG TABS tablet Take 1,300 mg by mouth 3 (three) times daily. 08/29/24   [provider]    Allergies:  Other    Review of Systems  Genitourinary:  Positive for difficulty urinating and hematuria.  All other systems reviewed and are negative.   Updated Vital Signs BP (!) 172/74 (BP Location: Right Arm)   Pulse 99   Temp 98.6 F (37 C) (Oral)   Resp 18   SpO2 100%   Physical Exam Vitals and nursing note reviewed.  Constitutional:      Comments: Uncomfortable  HENT:     Head: Normocephalic.     Nose: Nose normal.     Mouth/Throat:     Mouth: Mucous membranes are moist.  Eyes:     Extraocular Movements: Extraocular  movements intact.     Pupils: Pupils are equal, round, and reactive to light.  Cardiovascular:     Rate and Rhythm: Normal rate and regular rhythm.     Pulses: Normal pulses.     Heart sounds: Normal heart sounds.  Pulmonary:     Effort: Pulmonary effort is normal.     Breath sounds: Normal breath sounds.  Abdominal:     General: Abdomen is flat.     Palpations: Abdomen is soft.     Comments: Patient has a three-way catheter with dark urine.  Musculoskeletal:        General: Normal range of motion.     Cervical back: Normal range of motion and neck supple.  Skin:    General: Skin is warm.     Capillary Refill: Capillary refill takes less than 2 seconds.  Neurological:     General: No focal deficit present.     Mental Status: He is alert and oriented to person, place, and time.  Psychiatric:        Mood and Affect: Mood normal.        Behavior: Behavior normal.     (all labs ordered are listed, but only abnormal results are displayed) Labs Reviewed  CBC WITH DIFFERENTIAL/PLATELET  BASIC METABOLIC PANEL WITH GFR  URINALYSIS, W/ REFLEX TO CULTURE (INFECTION SUSPECTED)    EKG: None  Radiology: No results found.   Procedures  Bladder irrigation - I was able to irrigate catheter with normal saline.  I was able to extract several clots   Medications Ordered in the ED  sodium chloride  0.9 % bolus 1,000 mL (has no administration in time range)                                    Medical Decision Making Erik Gill is a 63 y.o. male here presenting with pain at the catheter site.  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  I was able to extract multiple clots.  Bedside ultrasound did not show any more clots.  Plan to check CBC and BMP.  I discussed with Dr. Cam from urology.  He recommend removing the catheter but patient states that every time this happens, he is back in retention in several days and would like to keep  the catheter in.  Dr. Cam recommends if the catheter is not removed, patient can get IV TXA and hydration and if hematuria improves, patient likely can go home  11:12 PM Labs are pending.  Signed out to Dr. Theadore to reassess patient in several hours.  If the catheter is still draining pinkish urine with no clots, anticipate patient can follow-up with urology outpatient.  Amount and/or Complexity of Data Reviewed Labs:  ordered.  Risk Prescription drug management.     Final diagnoses:  None    ED Discharge Orders     None          Patt Alm Macho, MD 09/06/24 2317

## 2024-09-07 DIAGNOSIS — R311 Benign essential microscopic hematuria: Secondary | ICD-10-CM

## 2024-09-07 DIAGNOSIS — N3040 Irradiation cystitis without hematuria: Secondary | ICD-10-CM

## 2024-09-07 DIAGNOSIS — D649 Anemia, unspecified: Secondary | ICD-10-CM

## 2024-09-07 DIAGNOSIS — Z8546 Personal history of malignant neoplasm of prostate: Secondary | ICD-10-CM

## 2024-09-07 DIAGNOSIS — R310 Gross hematuria: Secondary | ICD-10-CM

## 2024-09-07 LAB — CBC
HCT: 29.7 % — ABNORMAL LOW (ref 39.0–52.0)
Hemoglobin: 9.1 g/dL — ABNORMAL LOW (ref 13.0–17.0)
MCH: 25.8 pg — ABNORMAL LOW (ref 26.0–34.0)
MCHC: 30.6 g/dL (ref 30.0–36.0)
MCV: 84.1 fL (ref 80.0–100.0)
Platelets: 496 K/uL — ABNORMAL HIGH (ref 150–400)
RBC: 3.53 MIL/uL — ABNORMAL LOW (ref 4.22–5.81)
RDW: 15.6 % — ABNORMAL HIGH (ref 11.5–15.5)
WBC: 11.3 K/uL — ABNORMAL HIGH (ref 4.0–10.5)
nRBC: 0 % (ref 0.0–0.2)

## 2024-09-07 LAB — BASIC METABOLIC PANEL WITH GFR
Anion gap: 12 (ref 5–15)
BUN: 16 mg/dL (ref 8–23)
CO2: 20 mmol/L — ABNORMAL LOW (ref 22–32)
Calcium: 9.4 mg/dL (ref 8.9–10.3)
Chloride: 104 mmol/L (ref 98–111)
Creatinine, Ser: 1.23 mg/dL (ref 0.61–1.24)
GFR, Estimated: >60 mL/min (ref >60)
Glucose, Bld: 166 mg/dL — ABNORMAL HIGH (ref 70–99)
Potassium: 4.4 mmol/L (ref 3.5–5.1)
Sodium: 136 mmol/L (ref 135–145)

## 2024-09-07 LAB — URINALYSIS, W/ REFLEX TO CULTURE (INFECTION SUSPECTED)
Bacteria, UA: NONE SEEN
Glucose, UA: 50 mg/dL — AB
Nitrite: POSITIVE — AB
Protein, ur: 100 mg/dL — AB
RBC / HPF: >50 RBC/hpf (ref 0–5)
Specific Gravity, Urine: 1.014 (ref 1.005–1.030)
pH: 6 (ref 5.0–8.0)

## 2024-09-07 LAB — PREPARE RBC (CROSSMATCH)

## 2024-09-07 LAB — CULTURE, BLOOD (ROUTINE X 2)
Culture: NO GROWTH
Culture: NO GROWTH
Special Requests: ADEQUATE
Special Requests: ADEQUATE

## 2024-09-07 LAB — PROTIME-INR
INR: 1.2 (ref 0.8–1.2)
Prothrombin Time: 15.4 s — ABNORMAL HIGH (ref 11.4–15.2)

## 2024-09-07 MED ORDER — AMLODIPINE BESYLATE 10 MG PO TABS
10.0000 mg | ORAL_TABLET | Freq: Every day | ORAL | Status: DC
  Administered 2024-09-07 – 2024-09-09 (×3): 10 mg via ORAL
  Filled 2024-09-07: qty 2
  Filled 2024-09-07: qty 1
  Filled 2024-09-07: qty 2

## 2024-09-07 MED ORDER — MORPHINE SULFATE (PF) 2 MG/ML IV SOLN
2.0000 mg | INTRAVENOUS | Status: DC | PRN
  Administered 2024-09-07: 2 mg via INTRAVENOUS
  Filled 2024-09-07: qty 1

## 2024-09-07 MED ORDER — BENAZEPRIL HCL 20 MG PO TABS
40.0000 mg | ORAL_TABLET | Freq: Every day | ORAL | Status: DC
  Administered 2024-09-07 – 2024-09-09 (×3): 40 mg via ORAL
  Filled 2024-09-07 (×3): qty 2

## 2024-09-07 MED ORDER — ONDANSETRON HCL 4 MG/2ML IJ SOLN: 4.0000 mg | Freq: Four times a day (QID) | INTRAMUSCULAR | Status: DC | PRN

## 2024-09-07 MED ORDER — ONDANSETRON HCL 4 MG PO TABS: 4.0000 mg | ORAL_TABLET | Freq: Four times a day (QID) | ORAL | Status: DC | PRN

## 2024-09-07 MED ORDER — METOPROLOL SUCCINATE ER 50 MG PO TB24
150.0000 mg | ORAL_TABLET | Freq: Every day | ORAL | Status: DC
  Administered 2024-09-07 – 2024-09-09 (×3): 150 mg via ORAL
  Filled 2024-09-07 (×3): qty 3

## 2024-09-07 MED ORDER — ACETAMINOPHEN 650 MG RE SUPP: 650.0000 mg | Freq: Four times a day (QID) | RECTAL | Status: DC | PRN

## 2024-09-07 MED ORDER — MORPHINE SULFATE (PF) 4 MG/ML IV SOLN
4.0000 mg | Freq: Once | INTRAVENOUS | Status: AC
  Administered 2024-09-07: 4 mg via INTRAVENOUS
  Filled 2024-09-07: qty 1

## 2024-09-07 MED ORDER — ACETAMINOPHEN 325 MG PO TABS: 650.0000 mg | ORAL_TABLET | Freq: Four times a day (QID) | ORAL | Status: DC | PRN

## 2024-09-07 MED ORDER — SODIUM CHLORIDE 0.9% IV SOLUTION: Freq: Once | INTRAVENOUS | Status: AC

## 2024-09-07 MED ORDER — SODIUM CHLORIDE 0.9 % IR SOLN
3000.0000 mL | Status: DC
Start: 2024-09-07 — End: 2024-09-09
  Administered 2024-09-07: 3000 mL

## 2024-09-07 MED ORDER — OXYCODONE HCL 5 MG PO TABS
5.0000 mg | ORAL_TABLET | ORAL | Status: DC | PRN
  Administered 2024-09-07: 5 mg via ORAL
  Filled 2024-09-07: qty 1

## 2024-09-07 MED ORDER — AMLODIPINE BESY-BENAZEPRIL HCL 10-40 MG PO CAPS: 1.0000 | ORAL_CAPSULE | Freq: Every morning | ORAL | Status: DC

## 2024-09-07 MED ORDER — CHLORHEXIDINE GLUCONATE CLOTH 2 % EX PADS
6.0000 | MEDICATED_PAD | Freq: Every day | CUTANEOUS | Status: DC
  Administered 2024-09-07 – 2024-09-09 (×3): 6 via TOPICAL

## 2024-09-07 NOTE — Consult Note (Signed)
 Urology Consult   Reason for consult: Gross hematuria, radiation cystitis  History of Present Illness: Erik Gill is a 63 y.o.  who is a patient of Dr. Renda who has a history of prostate cancer who underwent prostatectomy and subsequently salvage radiation therapy who has radiation cystitis.  He was recently admitted with significant intermittent hematuria and clot retention requiring blood transfusions.  He received alum irrigation last admission. He has been undergoing hyperbaric oxygen  treatments over the past couple of weeks. He was seen in the office last Friday with persistent hematuria and 24 French Foley catheter was placed and clots were irrigated well.  He returned to the hospital this weekend with persistent clot retention. He underwent irrigation to remove clots. Hemoglobin last night was 6.2 down from 8.3.  He is on CBI with slow drip and clear return. He reports doing well and denies abdominal pain or flank pain. Tolerating foley catheter but frustrated with recurrent hematuria.  Past Medical History:  Diagnosis Date   Elevated PSA    GERD (gastroesophageal reflux disease)    Headache    sinus related   Hypertension    Nocturia    Prostate cancer (HCC)    Wears glasses     Past Surgical History:  Procedure Laterality Date   HERNIA REPAIR Right    LYMPHADENECTOMY Bilateral 11/14/2021   Procedure: LYMPHADENECTOMY, PELVIC;  Surgeon: Renda Glance, MD;  Location: [facility];  Service: Urology;  Laterality: Bilateral;   PROSTATE BIOPSY N/A 08/29/2017   Procedure: BIOPSY TRANSRECTAL ULTRASONIC PROSTATE (TUBP);  Surgeon: Devere Lonni Righter, MD;  Location: [facility];  Service: Urology;  Laterality: N/A;   ROBOT ASSISTED LAPAROSCOPIC RADICAL PROSTATECTOMY N/A 11/14/2021   Procedure: XI ROBOTIC ASSISTED LAPAROSCOPIC RADICAL PROSTATECTOMY LEVEL 1;  Surgeon: Renda Glance, MD;  Location: [facility];  Service: Urology;  Laterality: N/A;    Current Hospital  Medications:  Home Meds:  No current facility-administered medications on file prior to encounter.   Current Outpatient Medications on File Prior to Encounter  Medication Sig Dispense Refill   acetaminophen  (TYLENOL ) 500 MG tablet Take 500 mg by mouth every 6 (six) hours as needed.     albuterol  (VENTOLIN  HFA) 108 (90 Base) MCG/ACT inhaler Inhale 2 puffs into the lungs every 4 (four) hours as needed for wheezing or shortness of breath. 8 g 0   amLODipine -benazepril  (LOTREL) 10-40 MG capsule Take 1 capsule by mouth in the morning.     cetirizine (ZYRTEC ALLERGY) 10 MG tablet Take 10 mg by mouth in the morning.     ferrous sulfate  325 (65 FE) MG EC tablet Take 325 mg by mouth daily with breakfast.     fluticasone  (FLONASE ) 50 MCG/ACT nasal spray Place 1 spray into both nostrils daily for 3 days. (Patient taking differently: Place 1 spray into both nostrils in the morning.) 16 g 0   Lidocaine  4 % GEL Apply small amount to urethral opening twice daily as needed for irritation from foley catheter 30 g 0   metoprolol  succinate (TOPROL -XL) 100 MG 24 hr tablet Take 100 mg by mouth in the morning. (Patient taking differently: Take 150 mg by mouth in the morning.)     Naphazoline-Pheniramine (OPCON-A ) 0.027-0.315 % SOLN Place 1 drop into both eyes daily as needed (allergies).     nicotine  (NICODERM CQ  - DOSED IN MG/24 HOURS) 14 mg/24hr patch Place 1 patch (14 mg total) onto the skin daily. (Patient taking differently: Place 14 mg onto the skin daily as needed.)  28 patch 2   ondansetron  (ZOFRAN ) 4 MG tablet Take 1 tablet (4 mg total) by mouth every 6 (six) hours as needed for nausea. 20 tablet 0   pantoprazole  (PROTONIX ) 40 MG tablet Take 40 mg by mouth daily as needed.     tranexamic acid  (LYSTEDA ) 650 MG TABS tablet Take 1,300 mg by mouth 3 (three) times daily.     ciprofloxacin  (CIPRO ) 500 MG tablet Take 1 tablet (500 mg total) by mouth 2 (two) times daily for 10 days. 20 tablet 0   ofloxacin (FLOXIN)  0.3 % OTIC solution Place 5 drops into both ears as needed.       Scheduled Meds:  amLODipine   10 mg Oral Daily   And   benazepril   40 mg Oral Daily   Chlorhexidine  Gluconate Cloth  6 each Topical Daily   metoprolol  succinate  150 mg Oral Daily   Continuous Infusions:  sodium chloride  irrigation     PRN Meds:.acetaminophen  **OR** acetaminophen , morphine  injection, ondansetron  **OR** ondansetron  (ZOFRAN ) IV, oxyCODONE  Allergies:  Allergies  Allergen Reactions   Other     Seasonal Allergies that are year round    Family History  Problem Relation Age of Onset   Cancer Father        colon cancer   Cancer Brother        prostate   Cancer Brother        prostate   Cancer Cousin        prostate cancer/maternal first cousin    Social History:  reports that he has been smoking cigarettes. He has a 0.5 pack-year smoking history. He has never used smokeless tobacco. He reports that he does not currently use alcohol. He reports that he does not use drugs.  ROS: A complete review of systems was performed.  All systems are negative except for pertinent findings as noted.  Physical Exam:  Vital signs in last 24 hours: Temp:  [97.6 F (36.4 C)-98.8 F (37.1 C)] 98.8 F (37.1 C) (10/05 1105) Pulse Rate:  [67-99] 75 (10/05 1105) Resp:  [15-20] 20 (10/05 1105) BP: (138-179)/(69-90) 158/75 (10/05 1105) SpO2:  [96 %-100 %] 100 % (10/05 1105) Constitutional:  Alert and oriented, No acute distress Cardiovascular: Regular rate and rhythm Respiratory: Normal respiratory effort, Lungs clear bilaterally GI: Abdomen is soft, nontender, nondistended, no abdominal masses GU: No CVA tenderness Neurologic: Grossly intact, no focal deficits Psychiatric: Normal mood and affect  Laboratory Data:  Recent Labs    09/06/24 2244  WBC 14.1*  HGB 6.2*  HCT 20.8*  PLT 456*    Recent Labs    09/06/24 2244  NA 135  K 4.5  CL 103  GLUCOSE 111*  BUN 25*  CALCIUM 9.3  CREATININE 1.74*      Results for orders placed or performed during the hospital encounter of 09/06/24 (from the past 24 hours)  CBC with Differential     Status: Abnormal   Collection Time: 09/06/24 10:44 PM  Result Value Ref Range   WBC 14.1 (H) 4.0 - 10.5 K/uL   RBC 2.50 (L) 4.22 - 5.81 MIL/uL   Hemoglobin 6.2 (LL) 13.0 - 17.0 g/dL   HCT 79.1 (L) 60.9 - 47.9 %   MCV 83.2 80.0 - 100.0 fL   MCH 24.8 (L) 26.0 - 34.0 pg   MCHC 29.8 (L) 30.0 - 36.0 g/dL   RDW 83.7 (H) 88.4 - 84.4 %   Platelets 456 (H) 150 - 400 K/uL   nRBC  0.0 0.0 - 0.2 %   Neutrophils Relative % 81 %   Neutro Abs 11.4 (H) 1.7 - 7.7 K/uL   Lymphocytes Relative 8 %   Lymphs Abs 1.1 0.7 - 4.0 K/uL   Monocytes Relative 8 %   Monocytes Absolute 1.2 (H) 0.1 - 1.0 K/uL   Eosinophils Relative 1 %   Eosinophils Absolute 0.2 0.0 - 0.5 K/uL   Basophils Relative 1 %   Basophils Absolute 0.1 0.0 - 0.1 K/uL   Immature Granulocytes 1 %   Abs Immature Granulocytes 0.17 (H) 0.00 - 0.07 K/uL  Basic metabolic panel     Status: Abnormal   Collection Time: 09/06/24 10:44 PM  Result Value Ref Range   Sodium 135 135 - 145 mmol/L   Potassium 4.5 3.5 - 5.1 mmol/L   Chloride 103 98 - 111 mmol/L   CO2 20 (L) 22 - 32 mmol/L   Glucose, Bld 111 (H) 70 - 99 mg/dL   BUN 25 (H) 8 - 23 mg/dL   Creatinine, Ser 8.25 (H) 0.61 - 1.24 mg/dL   Calcium 9.3 8.9 - 89.6 mg/dL   GFR, Estimated 44 (L) >60 mL/min   Anion gap 12 5 - 15  Urinalysis, w/ Reflex to Culture (Infection Suspected) -Urine, Clean Catch     Status: Abnormal   Collection Time: 09/06/24 11:18 PM  Result Value Ref Range   Specimen Source URINE, CLEAN CATCH    Color, Urine RED (A) YELLOW   APPearance CLOUDY (A) CLEAR   Specific Gravity, Urine  1.005 - 1.030    TEST NOT REPORTED DUE TO COLOR INTERFERENCE OF URINE PIGMENT   pH  5.0 - 8.0    TEST NOT REPORTED DUE TO COLOR INTERFERENCE OF URINE PIGMENT   Glucose, UA (A) NEGATIVE mg/dL    TEST NOT REPORTED DUE TO COLOR INTERFERENCE OF URINE  PIGMENT   Hgb urine dipstick (A) NEGATIVE    TEST NOT REPORTED DUE TO COLOR INTERFERENCE OF URINE PIGMENT   Bilirubin Urine (A) NEGATIVE    TEST NOT REPORTED DUE TO COLOR INTERFERENCE OF URINE PIGMENT   Ketones, ur (A) NEGATIVE mg/dL    TEST NOT REPORTED DUE TO COLOR INTERFERENCE OF URINE PIGMENT   Protein, ur (A) NEGATIVE mg/dL    TEST NOT REPORTED DUE TO COLOR INTERFERENCE OF URINE PIGMENT   Nitrite (A) NEGATIVE    TEST NOT REPORTED DUE TO COLOR INTERFERENCE OF URINE PIGMENT   Leukocytes,Ua (A) NEGATIVE    TEST NOT REPORTED DUE TO COLOR INTERFERENCE OF URINE PIGMENT   RBC / HPF >50 0 - 5 RBC/hpf   WBC, UA 0-5 0 - 5 WBC/hpf   Bacteria, UA NONE SEEN NONE SEEN   Squamous Epithelial / HPF 0-5 0 - 5 /HPF  Type and screen [facility]     Status: None (Preliminary result)   Collection Time: 09/07/24 12:02 AM  Result Value Ref Range   ABO/RH(D) O POS    Antibody Screen NEG    Sample Expiration 09/10/2024,2359    Unit Number [id number]    Blood Component Type RED CELLS,LR    Unit division 00    Status of Unit ISSUED    Transfusion Status OK TO TRANSFUSE    Crossmatch Result      Compatible Performed at [facility], [address], [place], [place] [address]    Unit Number [id number]    Blood Component Type RBC LR PHER1    Unit division 00  Status of Unit ISSUED    Transfusion Status OK TO TRANSFUSE    Crossmatch Result Compatible   Prepare RBC (crossmatch)     Status: None   Collection Time: 09/07/24 12:02 AM  Result Value Ref Range   Order Confirmation      ORDER PROCESSED BY BLOOD BANK Performed at [facility], [address] [place], [place] [address]    Recent Results (from the past 240 hours)  Urine Culture (for pregnant, neutropenic or urologic patients or patients with an indwelling urinary catheter)     Status: Abnormal   Collection Time: 09/02/24  2:11 PM   Specimen: Urine, Catheterized  Result  Value Ref Range Status   Specimen Description   Final    URINE, CATHETERIZED Performed at [facility], [address] [place], [place] [address]    Special Requests   Final    NONE Performed at [facility], [address] [place], [place] [address]    Culture 70,000 COLONIES/mL PSEUDOMONAS PUTIDA (A)  Final   Report Status 09/04/2024 FINAL  Final   Organism ID, Bacteria PSEUDOMONAS PUTIDA (A)  Final      Susceptibility   Pseudomonas putida - MIC*    MEROPENEM 4 SENSITIVE Sensitive     CIPROFLOXACIN  0.12 SENSITIVE Sensitive     PIP/TAZO Value in next row Sensitive      8 SENSITIVEThis is a modified FDA-approved test that has been validated and its performance characteristics determined by the reporting laboratory.  This laboratory is certified under the Clinical Laboratory Improvement Amendments CLIA as qualified to perform high complexity clinical laboratory testing.    CEFEPIME Value in next row Sensitive      8 SENSITIVEThis is a modified FDA-approved test that has been validated and its performance characteristics determined by the reporting laboratory.  This laboratory is certified under the Clinical Laboratory Improvement Amendments CLIA as qualified to perform high complexity clinical laboratory testing.    * 70,000 COLONIES/mL PSEUDOMONAS PUTIDA  Culture, blood (Routine X 2) w Reflex to ID Panel     Status: None   Collection Time: 09/02/24  2:15 PM   Specimen: BLOOD LEFT ARM  Result Value Ref Range Status   Specimen Description   Final    BLOOD LEFT ARM Performed at [facility], [address] [place], [place] [address]    Special Requests   Final    BOTTLES DRAWN AEROBIC AND ANAEROBIC Blood Culture adequate volume Performed at [facility], [address], [place], [place] [address]    Culture   Final    NO GROWTH 5 DAYS Performed at [facility], [address], [place], [place] [address]     Report Status 09/07/2024 FINAL  Final  Culture, blood (Routine X 2) w Reflex to ID Panel     Status: None   Collection Time: 09/02/24  2:15 PM   Specimen: BLOOD LEFT HAND  Result Value Ref Range Status   Specimen Description   Final    BLOOD LEFT HAND Performed at [facility], [address], [place], [place] [address]    Special Requests   Final    BOTTLES DRAWN AEROBIC AND ANAEROBIC Blood Culture adequate volume Performed at [facility], [address] [place], [place] [address]    Culture   Final    NO GROWTH 5 DAYS Performed at [facility], [address], [place], [place]  [address]    Report Status 09/07/2024 FINAL  Final    Renal Function: Recent Labs    08/31/24 1935 09/01/24 1015 09/02/24 0540 09/03/24 0459 09/06/24 2244  CREATININE 1.23 2.15* 1.26* 1.09 1.74*   Estimated Creatinine Clearance: 46.8 mL/min (A) (by C-G formula based on SCr of 1.74 mg/dL (H)).  Radiologic Imaging: No results found.  I independently reviewed the above imaging studies.  Impression/Recommendation  1.  Gross hematuria from radiation cystitis:  Discussed complex case with patient with recurrent persistent significant hematuria that is intermittent in nature. Wean CBI as able. Presently on slow drip and clear return.  He will likely be able to go home tomorrow. Pt frustrated with recurrent catheterizations. Will defer either void trial or continuing catheter to Dr. Renda. Continue hyberbaric oxygen  treatments. Provided encouragement that this is successful in severe radiation cystitis treatments however can take some time before seeing improvement. Will notify Dr. Renda of his admission.   Matt R. Lusia Greis MD 09/07/2024, 12:31 PM  [facility]  Pager: [phone]

## 2024-09-07 NOTE — Progress Notes (Signed)
 PROGRESS NOTE    Erik Gill  FMW:[id number] DOB: [birth year] DOA: 09/06/2024 PCP: Arloa Erik SAUNDERS, MD   Brief Narrative:  Erik Gill is an unfortunate 63 y.o. male with medical history significant of GERD, hypertension, prostate cancer who presented to the emergency department due to recurrent and somewhat profound hematuria. Patient has a history of radiation cystitis requiring CBI, he has undergone hyperbaric oxygen  treatments and previous evaluation by urology on multiple occasions over the past 2 years.  At home he was unable to clear his catheter and presented to the hospital with worsening obstructive uropathy due to frank hematuria and likely blood clot.  Hospitalist called for admission, urology called in consult, continue CBI per their expertise in the interim, blood transfusion ordered given profound anemia at intake.  Of note patient was just seen in our facility and discharged on 10/1 for similar episode with marked improvement at the time on CBI, Foley was removed at that time but replaced in the urology office on 10/2 without any complications.  Assessment & Plan:   Principal Problem:   Hematuria  Gross hematuria, recurrent, secondary to radiation cystitis, POA -Multiple episodes of hematuria recently discharged on 10/1 for similar episode -Urology following, appreciate insight and recommendations -Continue CBI, currently undergoing hyperbaric oxygen  treatment as well outpatient, questionable need for additional fulguration at this point -Urine output improving over the past 12 hours, urine continues to drain red with notable sediment   Acute on chronic anemia - In the setting of profound hematuria, status post transfusion 10/5 -Repeat hemoglobin pending   Malignant neoplasm of prostate  S/p prostatectomy and radiation (2023) Recurrent issues with hematuria and clot obstruction as above   Essential hypertension Resume Toprol  XL, amlodipine , benazepril     Tobacco  abuse Encourage cessation Patch ordered  DVT prophylaxis: SCDs Start: 09/07/24 0357 Code Status:   Code Status: Full Code Family Communication: None present  Status is: Inpatient  Dispo: The patient is from: Home              Anticipated d/c is to: Home              Anticipated d/c date is: 24 to 48 hours              Patient currently not medically stable for discharge  Consultants:  Neurology  Procedures:  None  Antimicrobials:  None  Subjective: No acute issues or events overnight, tolerated transfusion quite well, currently tolerating CBI with improving urine output while still bloody no longer dark frank blood.  Objective: Vitals:   09/07/24 0306 09/07/24 0315 09/07/24 0426 09/07/24 0624  BP: (!) 177/90  (!) 142/69 (!) 152/69  Pulse: 89 77 76 67  Resp: 17  18 18   Temp: 98.5 F (36.9 C)  98.4 F (36.9 C) 97.6 F (36.4 C)  TempSrc: Oral  Oral Oral  SpO2: 100% 96% 100% 99%    Intake/Output Summary (Last 24 hours) at 09/07/2024 0724 Last data filed at 09/07/2024 9364 Gross per 24 hour  Intake 2182 ml  Output 2500 ml  Net -318 ml   There were no vitals filed for this visit.  Examination:  General:  Pleasantly resting in bed, No acute distress. HEENT:  Normocephalic atraumatic.  Sclerae nonicteric, noninjected.  Extraocular movements intact bilaterally. Neck:  Without mass or deformity.  Trachea is midline. Lungs:  Clear to auscultate bilaterally without rhonchi, wheeze, or rales. Heart:  Regular rate and rhythm.  Without murmurs, rubs, or gallops. Abdomen:  Soft, nontender,  nondistended.  Without guarding or rebound. GU: Foley intact draining dark red urine, CBI ongoing Extremities: Without cyanosis, clubbing, edema, or obvious deformity. Skin:  Warm and dry, no erythema.  Data Reviewed: I have personally reviewed following labs and imaging studies  CBC: Recent Labs  Lab 08/31/24 1935 09/01/24 1015 09/01/24 1657 09/02/24 0540 09/03/24 0459  09/06/24 2244  WBC 8.0 14.8* 18.2* 18.0* 15.8* 14.1*  NEUTROABS 6.2  --   --   --   --  11.4*  HGB 6.5* 9.7* 9.1* 8.8* 8.3* 6.2*  HCT 22.0* 31.9* 28.7* 27.6* 26.4* 20.8*  MCV 86.3 85.1 82.9 82.6 83.3 83.2  PLT 287 330 253 242 271 456*   Basic Metabolic Panel: Recent Labs  Lab 08/31/24 1935 09/01/24 1015 09/02/24 0540 09/03/24 0459 09/06/24 2244  NA 132* 133* 132* 131* 135  K 4.3 4.5 4.4 4.3 4.5  CL 100 98 101 101 103  CO2 21* 15* 19* 20* 20*  GLUCOSE 105* 230* 112* 107* 111*  BUN 21 23 23 22  25*  CREATININE 1.23 2.15* 1.26* 1.09 1.74*  CALCIUM 9.2 9.9 9.3 8.9 9.3  MG  --  2.3  --   --   --   PHOS  --  4.9*  --   --   --    GFR: Estimated Creatinine Clearance: 46.8 mL/min (A) (by C-G formula based on SCr of 1.74 mg/dL (H)). Liver Function Tests: Recent Labs  Lab 09/01/24 1015 09/01/24 1016  AST 24 22  ALT 14 15  ALKPHOS 88 88  BILITOT 1.0 1.0  PROT 7.4 7.4  ALBUMIN  4.7 4.7   Coagulation Profile: Recent Labs  Lab 08/31/24 1935  INR 1.2   Cardiac Enzymes: Recent Labs  Lab 09/01/24 1016  CKTOTAL 242   Recent Results (from the past 240 hours)  Urine Culture (for pregnant, neutropenic or urologic patients or patients with an indwelling urinary catheter)     Status: Abnormal   Collection Time: 09/02/24  2:11 PM   Specimen: Urine, Catheterized  Result Value Ref Range Status   Specimen Description   Final    URINE, CATHETERIZED Performed at [facility], [address], [place], [place] [address]    Special Requests   Final    NONE Performed at [facility], [address], [place], [place] [address]    Culture 70,000 COLONIES/mL PSEUDOMONAS PUTIDA (A)  Final   Report Status 09/04/2024 FINAL  Final   Organism ID, Bacteria PSEUDOMONAS PUTIDA (A)  Final      Susceptibility   Pseudomonas putida - MIC*    MEROPENEM 4 SENSITIVE Sensitive     CIPROFLOXACIN  0.12 SENSITIVE Sensitive     PIP/TAZO Value in next row Sensitive       8 SENSITIVEThis is a modified FDA-approved test that has been validated and its performance characteristics determined by the reporting laboratory.  This laboratory is certified under the Clinical Laboratory Improvement Amendments CLIA as qualified to perform high complexity clinical laboratory testing.    CEFEPIME Value in next row Sensitive      8 SENSITIVEThis is a modified FDA-approved test that has been validated and its performance characteristics determined by the reporting laboratory.  This laboratory is certified under the Clinical Laboratory Improvement Amendments CLIA as qualified to perform high complexity clinical laboratory testing.    * 70,000 COLONIES/mL PSEUDOMONAS PUTIDA  Culture, blood (Routine X 2) w Reflex to ID Panel     Status: None (Preliminary result)   Collection  Time: 09/02/24  2:15 PM   Specimen: BLOOD LEFT ARM  Result Value Ref Range Status   Specimen Description   Final    BLOOD LEFT ARM Performed at [facility], [address] [place], [place] [address]    Special Requests   Final    BOTTLES DRAWN AEROBIC AND ANAEROBIC Blood Culture adequate volume Performed at [facility], [address], [place], [place] [address]    Culture   Final    NO GROWTH 4 DAYS Performed at [facility], [address], [place], [place] [address]    Report Status PENDING  Incomplete  Culture, blood (Routine X 2) w Reflex to ID Panel     Status: None (Preliminary result)   Collection Time: 09/02/24  2:15 PM   Specimen: BLOOD LEFT HAND  Result Value Ref Range Status   Specimen Description   Final    BLOOD LEFT HAND Performed at [facility], [address], [place], [place] [address]    Special Requests   Final    BOTTLES DRAWN AEROBIC AND ANAEROBIC Blood Culture adequate volume Performed at [facility], [address] [place], [place] [address]    Culture   Final    NO GROWTH 4 DAYS Performed at [facility], [address] [place], [place] [address]    Report Status PENDING  Incomplete     Radiology Studies: No results found.  Scheduled Meds:  amLODipine   10 mg Oral Daily   And   benazepril   40 mg Oral Daily   metoprolol  succinate  150 mg Oral Daily   Continuous Infusions:  sodium chloride  irrigation       LOS: 0 days   Time spent:  Erik JAYSON Montclair, DO [facility]  If 7PM-7AM, please contact night-coverage [email or web address]  09/07/2024, 7:24 AM

## 2024-09-07 NOTE — Progress Notes (Signed)
 Pt has completed first unit of PRBCs. No signs or symptoms of reaction. Pt tolerated without any complications.

## 2024-09-07 NOTE — ED Notes (Signed)
 ED Provider at bedside.

## 2024-09-07 NOTE — H&P (Signed)
 History and Physical    Erik Gill FMW:[id number] DOB: 11/30/1961 DOA: 09/06/2024  PCP: Arloa Elsie SAUNDERS, MD   Chief Complaint:  hematuria  HPI: Erik Gill is a 63 y.o. male with medical history significant of GERD, hypertension, prostate cancer who presented to the emergency department due to hematuria.  Patient has a history of radiation cystitis requiring CBI.  He was having return of blood products in his catheter this morning.  His catheter was flushed and he presented to the ER.  Urology was consulted and provided recommendations.  Due to downtrending hemoglobin he was admitted for further workup.  Labs were obtained which showed WBC 14.1, hemoglobin 6.2, creatinine 1.7, urinalysis unremarkable.  Patient was admitted for further workup.   Review of Systems: Review of Systems  Constitutional: Negative.   HENT: Negative.    Eyes: Negative.   Respiratory: Negative.    Cardiovascular: Negative.   Gastrointestinal: Negative.   Genitourinary:  Positive for hematuria.  Musculoskeletal: Negative.   Skin: Negative.   Neurological: Negative.   Endo/Heme/Allergies: Negative.   Psychiatric/Behavioral: Negative.       As per HPI otherwise 10 point review of systems negative.   Allergies  Allergen Reactions   Other     Seasonal Allergies that are year round    Past Medical History:  Diagnosis Date   Elevated PSA    GERD (gastroesophageal reflux disease)    Headache    sinus related   Hypertension    Nocturia    Prostate cancer (HCC)    Wears glasses     Past Surgical History:  Procedure Laterality Date   HERNIA REPAIR Right    LYMPHADENECTOMY Bilateral 11/14/2021   Procedure: LYMPHADENECTOMY, PELVIC;  Surgeon: Renda Glance, MD;  Location: [facility];  Service: Urology;  Laterality: Bilateral;   PROSTATE BIOPSY N/A 08/29/2017   Procedure: BIOPSY TRANSRECTAL ULTRASONIC PROSTATE (TUBP);  Surgeon: Devere Lonni Righter, MD;  Location: [facility];   Service: Urology;  Laterality: N/A;   ROBOT ASSISTED LAPAROSCOPIC RADICAL PROSTATECTOMY N/A 11/14/2021   Procedure: XI ROBOTIC ASSISTED LAPAROSCOPIC RADICAL PROSTATECTOMY LEVEL 1;  Surgeon: Renda Glance, MD;  Location: [facility];  Service: Urology;  Laterality: N/A;     reports that he has been smoking cigarettes. He has a 0.5 pack-year smoking history. He has never used smokeless tobacco. He reports that he does not currently use alcohol. He reports that he does not use drugs.  Family History  Problem Relation Age of Onset   Cancer Father        colon cancer   Cancer Brother        prostate   Cancer Brother        prostate   Cancer Cousin        prostate cancer/maternal first cousin    Prior to Admission medications   Medication Sig Start Date End Date Taking? Authorizing Provider  acetaminophen  (TYLENOL ) 500 MG tablet Take 500 mg by mouth every 6 (six) hours as needed.   Yes [provider]  albuterol  (VENTOLIN  HFA) 108 (90 Base) MCG/ACT inhaler Inhale 2 puffs into the lungs every 4 (four) hours as needed for wheezing or shortness of breath. 06/20/24  Yes Arloa Suzen RAMAN, NP  amLODipine -benazepril  (LOTREL) 10-40 MG capsule Take 1 capsule by mouth in the morning. 09/28/16  Yes [provider]  cetirizine (ZYRTEC ALLERGY) 10 MG tablet Take 10 mg by mouth in the morning.   Yes [provider]  ferrous sulfate  325 (65 FE)  MG EC tablet Take 325 mg by mouth daily with breakfast.   Yes [provider]  fluticasone  (FLONASE ) 50 MCG/ACT nasal spray Place 1 spray into both nostrils daily for 3 days. Patient taking differently: Place 1 spray into both nostrils in the morning. 05/19/22 09/07/24 Yes Hazen Darryle BRAVO, FNP  Lidocaine  4 % GEL Apply small amount to urethral opening twice daily as needed for irritation from foley catheter 08/29/24  Yes Logan Ubaldo NOVAK, PA-C  metoprolol  succinate (TOPROL -XL) 100 MG 24 hr tablet Take 100 mg by mouth in the  morning. Patient taking differently: Take 150 mg by mouth in the morning. 08/21/22  Yes [provider]  Naphazoline-Pheniramine (OPCON-A ) 0.027-0.315 % SOLN Place 1 drop into both eyes daily as needed (allergies).   Yes [provider]  nicotine  (NICODERM CQ  - DOSED IN MG/24 HOURS) 14 mg/24hr patch Place 1 patch (14 mg total) onto the skin daily. Patient taking differently: Place 14 mg onto the skin daily as needed. 08/20/24  Yes Floretta Mallard, MD  ondansetron  (ZOFRAN ) 4 MG tablet Take 1 tablet (4 mg total) by mouth every 6 (six) hours as needed for nausea. 08/24/24  Yes Sheikh, Omair Latif, DO  pantoprazole  (PROTONIX ) 40 MG tablet Take 40 mg by mouth daily as needed. 04/21/24  Yes [provider]  tranexamic acid  (LYSTEDA ) 650 MG TABS tablet Take 1,300 mg by mouth 3 (three) times daily. 08/29/24  Yes [provider]  ciprofloxacin  (CIPRO ) 500 MG tablet Take 1 tablet (500 mg total) by mouth 2 (two) times daily for 10 days. 09/04/24 09/14/24  Spratte, Lyle SAILOR, MD  ofloxacin (FLOXIN) 0.3 % OTIC solution Place 5 drops into both ears as needed. 08/26/24   [provider]    Physical Exam: Vitals:   09/07/24 0254 09/07/24 0300 09/07/24 0306 09/07/24 0315  BP:   (!) 177/90   Pulse: 88 90 89 77  Resp: 19  17   Temp:   98.5 F (36.9 C)   TempSrc:   Oral   SpO2: 100% 100% 100% 96%   Physical Exam Constitutional:      Appearance: He is normal weight.  HENT:     Head: Normocephalic.     Nose: Nose normal.     Mouth/Throat:     Mouth: Mucous membranes are moist.     Pharynx: Oropharynx is clear.  Eyes:     Conjunctiva/sclera: Conjunctivae normal.     Pupils: Pupils are equal, round, and reactive to light.  Cardiovascular:     Rate and Rhythm: Normal rate and regular rhythm.     Pulses: Normal pulses.     Heart sounds: Normal heart sounds.  Pulmonary:     Effort: Pulmonary effort is normal.     Breath sounds: Normal breath sounds.  Abdominal:      General: Abdomen is flat. Bowel sounds are normal.  Musculoskeletal:        General: Normal range of motion.     Cervical back: Normal range of motion.  Skin:    General: Skin is warm.     Capillary Refill: Capillary refill takes less than 2 seconds.  Neurological:     General: No focal deficit present.     Mental Status: He is alert.  Psychiatric:        Mood and Affect: Mood normal.        Labs on Admission: I have personally reviewed the patients's labs and imaging studies.  Assessment/Plan Principal Problem:   Hematuria   #  Gross hematuria likely related to radiation cystitis - Patient has history of prior similar presentations with exposure-recently discharged on 10/1 at which time was found to have symptomatic anemia and blocked Foley catheter - Given CBI  Plan: Continue CBI Appreciate urology recommendations  # Acute on chronic anemia most likely related to radiation cystitis-continue to monitor, check cbc in morning.   # Metastatic prostate cancer-patient received prostatectomy and radiation 2023.  Will continue to monitor  # Hypertension-continue metoprolol , amlodipine , benazepril    Admission status: Observation Med-Surg  Certification: The appropriate patient status for this patient is OBSERVATION. Observation status is judged to be reasonable and necessary in order to provide the required intensity of service to ensure the patient's safety. The patient's presenting symptoms, physical exam findings, and initial radiographic and laboratory data in the context of their medical condition is felt to place them at decreased risk for further clinical deterioration. Furthermore, it is anticipated that the patient will be medically stable for discharge from the hospital within 2 midnights of admission.     Lamar Dess MD [facility] If 7PM-7AM, please contact night-coverage [email or web address]  09/07/2024, 3:58 AM

## 2024-09-08 ENCOUNTER — Encounter (HOSPITAL_COMMUNITY): Payer: Self-pay | Admitting: Internal Medicine

## 2024-09-08 ENCOUNTER — Encounter (HOSPITAL_COMMUNITY)
Admission: EM | Disposition: A | Discharge status: Home / Self Care | Payer: Self-pay | Source: Home / Self Care | Attending: Internal Medicine

## 2024-09-08 ENCOUNTER — Inpatient Hospital Stay (HOSPITAL_COMMUNITY): Admitting: Anesthesiology

## 2024-09-08 DIAGNOSIS — I10 Essential (primary) hypertension: Secondary | ICD-10-CM

## 2024-09-08 DIAGNOSIS — R319 Hematuria, unspecified: Secondary | ICD-10-CM | POA: Diagnosis present

## 2024-09-08 DIAGNOSIS — F17210 Nicotine dependence, cigarettes, uncomplicated: Secondary | ICD-10-CM

## 2024-09-08 DIAGNOSIS — Z8546 Personal history of malignant neoplasm of prostate: Secondary | ICD-10-CM | POA: Diagnosis not present

## 2024-09-08 DIAGNOSIS — Z79899 Other long term (current) drug therapy: Secondary | ICD-10-CM | POA: Diagnosis not present

## 2024-09-08 DIAGNOSIS — J449 Chronic obstructive pulmonary disease, unspecified: Secondary | ICD-10-CM

## 2024-09-08 DIAGNOSIS — D638 Anemia in other chronic diseases classified elsewhere: Secondary | ICD-10-CM | POA: Diagnosis present

## 2024-09-08 DIAGNOSIS — Z923 Personal history of irradiation: Secondary | ICD-10-CM | POA: Diagnosis not present

## 2024-09-08 DIAGNOSIS — D649 Anemia, unspecified: Secondary | ICD-10-CM | POA: Diagnosis not present

## 2024-09-08 DIAGNOSIS — D62 Acute posthemorrhagic anemia: Secondary | ICD-10-CM | POA: Diagnosis present

## 2024-09-08 DIAGNOSIS — Z9079 Acquired absence of other genital organ(s): Secondary | ICD-10-CM | POA: Diagnosis not present

## 2024-09-08 DIAGNOSIS — N3041 Irradiation cystitis with hematuria: Secondary | ICD-10-CM | POA: Diagnosis present

## 2024-09-08 DIAGNOSIS — Y842 Radiological procedure and radiotherapy as the cause of abnormal reaction of the patient, or of later complication, without mention of misadventure at the time of the procedure: Secondary | ICD-10-CM | POA: Diagnosis present

## 2024-09-08 DIAGNOSIS — N3040 Irradiation cystitis without hematuria: Secondary | ICD-10-CM | POA: Diagnosis not present

## 2024-09-08 DIAGNOSIS — Z8042 Family history of malignant neoplasm of prostate: Secondary | ICD-10-CM | POA: Diagnosis not present

## 2024-09-08 DIAGNOSIS — R310 Gross hematuria: Secondary | ICD-10-CM | POA: Diagnosis present

## 2024-09-08 DIAGNOSIS — Z800 Family history of malignant neoplasm of digestive organs: Secondary | ICD-10-CM | POA: Diagnosis not present

## 2024-09-08 DIAGNOSIS — K219 Gastro-esophageal reflux disease without esophagitis: Secondary | ICD-10-CM | POA: Diagnosis present

## 2024-09-08 HISTORY — PX: CYSTOSCOPY WITH FULGERATION: SHX6638

## 2024-09-08 LAB — BPAM RBC
Blood Product Expiration Date: 202511052359
Blood Product Expiration Date: 202511052359
ISSUE DATE / TIME: 202510050240
ISSUE DATE / TIME: 202510050820
Unit Type and Rh: 5100
Unit Type and Rh: 5100

## 2024-09-08 LAB — TYPE AND SCREEN
ABO/RH(D): O POS
Antibody Screen: NEGATIVE
Unit division: 0
Unit division: 0

## 2024-09-08 SURGERY — CYSTOSCOPY, WITH BLADDER FULGURATION
Anesthesia: General

## 2024-09-08 MED ORDER — LIDOCAINE 2% (20 MG/ML) 5 ML SYRINGE
INTRAMUSCULAR | Status: DC | PRN
  Administered 2024-09-08: 20 mg via INTRAVENOUS

## 2024-09-08 MED ORDER — FENTANYL CITRATE (PF) 250 MCG/5ML IJ SOLN
INTRAMUSCULAR | Status: DC | PRN
  Administered 2024-09-08: 100 ug via INTRAVENOUS

## 2024-09-08 MED ORDER — MIDAZOLAM HCL 2 MG/2ML IJ SOLN
INTRAMUSCULAR | Status: AC
  Filled 2024-09-08: qty 2

## 2024-09-08 MED ORDER — MIDAZOLAM HCL 2 MG/2ML IJ SOLN: 0.5000 mg | Freq: Once | INTRAMUSCULAR | Status: DC | PRN

## 2024-09-08 MED ORDER — FENTANYL CITRATE PF 50 MCG/ML IJ SOSY
25.0000 ug | PREFILLED_SYRINGE | INTRAMUSCULAR | Status: DC | PRN
  Administered 2024-09-08: 50 ug via INTRAVENOUS

## 2024-09-08 MED ORDER — LABETALOL HCL 5 MG/ML IV SOLN
INTRAVENOUS | Status: AC
Start: 2024-09-08 — End: 2024-09-08
  Filled 2024-09-08: qty 4

## 2024-09-08 MED ORDER — FENTANYL CITRATE (PF) 100 MCG/2ML IJ SOLN
INTRAMUSCULAR | Status: AC
  Filled 2024-09-08: qty 2

## 2024-09-08 MED ORDER — LACTATED RINGERS IV SOLN: INTRAVENOUS | Status: DC

## 2024-09-08 MED ORDER — ONDANSETRON HCL 4 MG/2ML IJ SOLN
INTRAMUSCULAR | Status: DC | PRN
  Administered 2024-09-08: 4 mg via INTRAVENOUS

## 2024-09-08 MED ORDER — DEXAMETHASONE SODIUM PHOSPHATE 10 MG/ML IJ SOLN
INTRAMUSCULAR | Status: DC | PRN
  Administered 2024-09-08: 10 mg via INTRAVENOUS

## 2024-09-08 MED ORDER — FENTANYL CITRATE PF 50 MCG/ML IJ SOSY
PREFILLED_SYRINGE | INTRAMUSCULAR | Status: AC
  Filled 2024-09-08: qty 2

## 2024-09-08 MED ORDER — OXYCODONE HCL 5 MG PO TABS: 5.0000 mg | ORAL_TABLET | Freq: Once | ORAL | Status: DC | PRN

## 2024-09-08 MED ORDER — HYDROMORPHONE HCL 2 MG/ML IJ SOLN
INTRAMUSCULAR | Status: AC
  Filled 2024-09-08: qty 1

## 2024-09-08 MED ORDER — HYDROMORPHONE HCL 1 MG/ML IJ SOLN
INTRAMUSCULAR | Status: DC | PRN
  Administered 2024-09-08: .25 mg via INTRAVENOUS
  Administered 2024-09-08: .5 mg via INTRAVENOUS
  Administered 2024-09-08: .25 mg via INTRAVENOUS
  Administered 2024-09-08: .5 mg via INTRAVENOUS

## 2024-09-08 MED ORDER — SODIUM CHLORIDE 0.9 % IR SOLN
Status: DC | PRN
  Administered 2024-09-08: 6000 mL via INTRAVESICAL

## 2024-09-08 MED ORDER — OXYCODONE HCL 5 MG/5ML PO SOLN: 5.0000 mg | Freq: Once | ORAL | Status: DC | PRN

## 2024-09-08 MED ORDER — LABETALOL HCL 5 MG/ML IV SOLN
5.0000 mg | INTRAVENOUS | Status: DC | PRN
Start: 2024-09-08 — End: 2024-09-08
  Administered 2024-09-08 (×2): 5 mg via INTRAVENOUS
  Administered 2024-09-08: 10 mg via INTRAVENOUS

## 2024-09-08 MED ORDER — MEPERIDINE HCL 100 MG/ML IJ SOLN: 6.2500 mg | INTRAMUSCULAR | Status: DC | PRN

## 2024-09-08 MED ORDER — CEFAZOLIN SODIUM-DEXTROSE 2-4 GM/100ML-% IV SOLN
INTRAVENOUS | Status: AC
  Filled 2024-09-08: qty 100

## 2024-09-08 MED ORDER — PROPOFOL 10 MG/ML IV BOLUS
INTRAVENOUS | Status: AC
  Filled 2024-09-08: qty 20

## 2024-09-08 MED ORDER — PROPOFOL 10 MG/ML IV BOLUS
INTRAVENOUS | Status: DC | PRN
  Administered 2024-09-08: 150 mg via INTRAVENOUS
  Administered 2024-09-08 (×2): 50 mg via INTRAVENOUS

## 2024-09-08 MED ORDER — MIDAZOLAM HCL 2 MG/2ML IJ SOLN
INTRAMUSCULAR | Status: DC | PRN
  Administered 2024-09-08: 2 mg via INTRAVENOUS

## 2024-09-08 MED ORDER — CHLORHEXIDINE GLUCONATE 0.12 % MT SOLN
15.0000 mL | Freq: Once | OROMUCOSAL | Status: AC
Start: 2024-09-08 — End: 2024-09-08
  Administered 2024-09-08: 15 mL via OROMUCOSAL

## 2024-09-08 SURGICAL SUPPLY — 13 items
BAG URINE DRAIN 2000ML AR STRL (UROLOGICAL SUPPLIES) IMPLANT
BAG URO CATCHER STRL LF (MISCELLANEOUS) ×1 IMPLANT
DRAPE FOOT SWITCH (DRAPES) ×1 IMPLANT
ELECT REM PT RETURN 15FT ADLT (MISCELLANEOUS) ×1 IMPLANT
GLOVE SURG LX STRL 7.5 STRW (GLOVE) ×1 IMPLANT
GOWN STRL REUS W/ TWL XL LVL3 (GOWN DISPOSABLE) ×1 IMPLANT
KIT TURNOVER KIT A (KITS) ×1 IMPLANT
LOOP CUT BIPOLAR 24F LRG (ELECTROSURGICAL) IMPLANT
MANIFOLD NEPTUNE II (INSTRUMENTS) ×1 IMPLANT
PACK CYSTO (CUSTOM PROCEDURE TRAY) ×1 IMPLANT
SYRINGE TOOMEY IRRIG 70ML (MISCELLANEOUS) IMPLANT
TUBING CONNECTING 10 (TUBING) ×1 IMPLANT
TUBING UROLOGY SET (TUBING) ×1 IMPLANT

## 2024-09-08 NOTE — Progress Notes (Signed)
 Patient ID: Erik Gill, male   DOB: 1961/06/27, 63 y.o.   MRN: [id number]  Urine slightly pink off CBI when rechecked.  Will plan to proceed to the OR this afternoon for cystoscopy and possible fulguration.

## 2024-09-08 NOTE — Progress Notes (Signed)
   09/08/24 1317  TOC Brief Assessment  Insurance and Status Reviewed  Patient has primary care physician Yes  Home environment has been reviewed resides in an apartment  Prior level of function: Independent  Prior/Current Home Services No current home services  Readmission risk has been reviewed Yes  Transition of care needs no transition of care needs at this time

## 2024-09-08 NOTE — Anesthesia Preprocedure Evaluation (Addendum)
 Anesthesia Evaluation  Patient identified by MRN, date of birth, ID band Patient awake    Reviewed: Allergy & Precautions, NPO status , Patient's Chart, lab work & pertinent test results, reviewed documented beta blocker date and time   History of Anesthesia Complications Negative for: history of anesthetic complications  Airway Mallampati: II  TM Distance: >3 FB Neck ROM: Full    Dental  (+) Teeth Intact, Dental Advisory Given   Pulmonary COPD,  COPD inhaler, Current Smoker and Patient abstained from smoking.   breath sounds clear to auscultation       Cardiovascular hypertension, Pt. on medications and Pt. on home beta blockers (-) angina  Rhythm:Regular Rate:Normal     Neuro/Psych  Headaches    GI/Hepatic Neg liver ROS,GERD  Medicated and Controlled,,  Endo/Other  negative endocrine ROS    Renal/GU hematuria     Musculoskeletal   Abdominal   Peds  Hematology  (+) Blood dyscrasia (Hb 9.1 s/p transfusion), anemia   Anesthesia Other Findings Prostate cancer  Reproductive/Obstetrics                              Anesthesia Physical Anesthesia Plan  ASA: 3  Anesthesia Plan: General   Post-op Pain Management: Tylenol  PO (pre-op)*   Induction: Intravenous  PONV Risk Score and Plan: 1 and Ondansetron  and Dexamethasone   Airway Management Planned: LMA  Additional Equipment: None  Intra-op Plan:   Post-operative Plan:   Informed Consent: I have reviewed the patients History and Physical, chart, labs and discussed the procedure including the risks, benefits and alternatives for the proposed anesthesia with the patient or authorized representative who has indicated his/her understanding and acceptance.     Dental advisory given  Plan Discussed with: CRNA and Surgeon  Anesthesia Plan Comments:          Anesthesia Quick Evaluation

## 2024-09-08 NOTE — Plan of Care (Signed)
  Problem: Education: Goal: Knowledge of General Education information will improve Description: Including pain rating scale, medication(s)/side effects and non-pharmacologic comfort measures Outcome: Progressing   Problem: Elimination: Goal: Will not experience complications related to bowel motility Outcome: Progressing   Problem: Pain Managment: Goal: General experience of comfort will improve and/or be controlled Outcome: Progressing

## 2024-09-08 NOTE — Transfer of Care (Signed)
 Immediate Anesthesia Transfer of Care Note  Patient: Erik Gill  Procedure(s) Performed: CYSTOSCOPY, CLOT EVACUATION WITH BLADDER FULGURATION  Patient Location: PACU  Anesthesia Type:General  Level of Consciousness: awake and patient cooperative  Airway & Oxygen  Therapy: Patient Spontanous Breathing and Patient connected to face mask oxygen   Post-op Assessment: Report given to RN and Post -op Vital signs reviewed and stable  Post vital signs: Reviewed and stable  Last Vitals:  Vitals Value Taken Time  BP 178/80 09/08/24 16:54  Temp 36.3 C 09/08/24 16:50  Pulse 74 09/08/24 16:58  Resp 19 09/08/24 16:58  SpO2 100 % 09/08/24 16:58  Vitals shown include unfiled device data.  Last Pain:  Vitals:   09/08/24 1447  TempSrc:   PainSc: 5          Complications: No notable events documented.

## 2024-09-08 NOTE — Op Note (Signed)
 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  Cystoscopy 2.  Clot evacuation 3.  Fulguration of bladder  Surgeon: Gretel CANDIE Renda Mickey MD  Anesthesia: General  Complications: None  EBL: Minimal  Specimens: None  Indication: [name] is a 63 year old gentleman with a history of salvage radiation therapy for recurrent prostate cancer.  He has had severe radiation cystitis requiring multiple hospitalizations, catheter placements, and blood transfusions.  He has recently begun hyperbaric oxygen  therapy but unfortunately has presented to the hospital twice during the first week of therapy due to recurrent clot retention/obstruction.  He was most recently admitted yesterday with a hemoglobin of 6.2 requiring 2 units of packed red blood cells.  Considering his recurrent hospital admissions and the fact that his urine has remained even light pink off irrigation, I recommended that we consider cystoscopy and further evaluation with possible fulguration.  The potential risks, complications, and the expected recovery process were discussed in detail.  Informed consent was obtained.  The patient was taken to the operating room and a general anesthetic was administered.  He was given preoperative antibiotics, placed in the dorsolithotomy position, and prepped and draped in the usual sterile fashion.  Next, preoperative timeout was performed.  Cystourethroscopy was then performed with a 22 French cystoscope sheath.  Inspection revealed a large amount of formed clot within the bladder.  The cystoscope was then removed and replaced with a 28 French resectoscope sheath.  A copious amount of clot was then irrigated from the bladder.  Reinspection revealed no further clot.  Further evaluation the bladder revealed diffuse radiation changes with multiple areas that appear to be actively oozing.  Using a resectoscope loop,  these areas were selectively cauterized until no active bleeding was noted.  The bladder was emptied and reinspected multiple times.  It was felt that replacement of the catheter likely would cause more bleeding considering the diffuse nature of his radiation cystitis.  Considering the fact that there was no active bleeding at the end of the procedure and it was felt that any active bleeding had been addressed, he was left without a catheter at the end of the procedure.  The patient tolerated the procedure well without complications.  He was able to be awakened and transferred to the recovery unit in satisfactory condition.

## 2024-09-08 NOTE — Progress Notes (Signed)
 Patient ID: Erik Gill, male   DOB: 01-12-1961, 63 y.o.   MRN: [id number]    Subjective: Erik Gill was readmitted yesterday after developing recurrent hematuria late last week.  I had placed a hematuria catheter on Friday and irrigated clots.  He was noted to have a Hgb of 6.2 and received 2 units PRBCs with an appropriate increase to 9.1 this morning. Started on CBI.  Objective: Vital signs in last 24 hours: Temp:  [97.9 F (36.6 C)-98.8 F (37.1 C)] 97.9 F (36.6 C) (10/06 0525) Pulse Rate:  [66-79] 66 (10/06 0525) Resp:  [16-20] 18 (10/06 0525) BP: (144-170)/(69-90) 170/75 (10/06 0525) SpO2:  [100 %] 100 % (10/06 0525)  Intake/Output from previous day: 10/05 0701 - 10/06 0700 In: 5580 [P.O.:1080] Out: 89199 [Urine:10800] Intake/Output this shift: No intake/output data recorded.  Physical Exam:  General: Alert and oriented GU: Urine clear on minimal CBI  Lab Results: Recent Labs    09/06/24 2244 09/07/24 1401  HGB 6.2* 9.1*  HCT 20.8* 29.7*   BMET Recent Labs    09/06/24 2244 09/07/24 1401  NA 135 136  K 4.5 4.4  CL 103 104  CO2 20* 20*  GLUCOSE 111* 166*  BUN 25* 16  CREATININE 1.74* 1.23  CALCIUM 9.3 9.4     Studies/Results: No results found.  Assessment/Plan: 1) Hematuria/radiation cystitis:  Will hold CBI this morning and re-evaluate late this morning.  If urine clear, will be ok to d/c home and resume hyperbaric oxygen  tomorrow.  If still with some hematuria off CBI, will tentatively plan to go to the OR this afternoon for cystoscopy/fulguration.  Keep NPO pending re-evaluation.   LOS: 0 days   Erik Gill 09/08/2024, 7:27 AM

## 2024-09-08 NOTE — Anesthesia Postprocedure Evaluation (Signed)
 Anesthesia Post Note  Patient: Erik Gill  Procedure(s) Performed: CYSTOSCOPY, CLOT EVACUATION WITH BLADDER FULGURATION     Patient location during evaluation: PACU Anesthesia Type: General Level of consciousness: awake and alert, oriented and patient cooperative Pain management: pain level controlled Vital Signs Assessment: post-procedure vital signs reviewed and stable Respiratory status: spontaneous breathing, nonlabored ventilation and respiratory function stable Cardiovascular status: blood pressure returned to baseline and stable Postop Assessment: no apparent nausea or vomiting Anesthetic complications: no   No notable events documented.  Last Vitals:  Vitals:   09/08/24 1821 09/08/24 1825  BP: (!) 159/80 (!) 162/87  Pulse: 74 75  Resp: 17 (!) 23  Temp:    SpO2: 100% 98%    Last Pain:  Vitals:   09/08/24 1715  TempSrc:   PainSc: Asleep                 Tija Biss,E. Garnet Overfield

## 2024-09-08 NOTE — Discharge Summary (Signed)
 Physician Discharge Summary  Erik Gill FMW:[id number] DOB: 06/05/1961 DOA: 09/06/2024  PCP: Erik Elsie SAUNDERS, MD  Admit date: 09/06/2024 Discharge date: 09/09/2024  Admitted From: Home Disposition: Home  Recommendations for Outpatient Follow-up:  Follow up with PCP in 1-2 weeks Follow-up with urology as scheduled  Home Health: None Equipment/Devices: None  Discharge Condition: Stable CODE STATUS: Full Diet recommendation: Regular diet as tolerated  Brief/Interim Summary: Erik Gill is an unfortunate 63 y.o. male with medical history significant of GERD, hypertension, prostate cancer who presented to the emergency department due to recurrent and somewhat profound hematuria. Patient has a history of radiation cystitis requiring CBI, he has undergone hyperbaric oxygen  treatments and previous evaluation by urology on multiple occasions over the past 2 years.  At home he was unable to clear his catheter and presented to the hospital with worsening obstructive uropathy due to frank hematuria and likely blood clot.  Hospitalist called for admission, urology called in consult, continue CBI per their expertise in the interim, blood transfusion ordered given profound anemia at intake.   Of note patient was just seen in our facility and discharged on 10/1 for similar episode with marked improvement at the time on CBI, Foley was removed at that time but replaced in the urology office on 10/2 without any complications.  Patient continued to have hematuria, urology took patient to the OR for cystoscopy with fulguration on 09/08/2024.  Patient tolerated quite well.  Otherwise given improvement in urine output, Foley was discontinued at the time of procedure and he is otherwise stable and agreeable for discharge home.  Plan for further outpatient follow-up with hyperbaric oxygen  as scheduled, follow-up outpatient with PCP and urology as well.    Discharge Diagnoses:  Principal Problem:    Hematuria  Gross hematuria, recurrent, secondary to radiation cystitis, POA, resolving -Multiple episodes of hematuria recently discharged on 10/1 for similar episode -Urology following, appreciate insight and recommendations -fulguration 09/08/2024 -Foley removed at that time   Acute on chronic anemia - In the setting of profound hematuria, status post transfusion 10/5 -Repeat hemoglobin stable   Malignant neoplasm of prostate, history of S/p prostatectomy and radiation (2023) Recurrent issues with hematuria and clot obstruction as above   Essential hypertension Resume Toprol  XL, amlodipine , benazepril     Tobacco abuse Encourage cessation Patch ordered  Discharge Instructions  Discharge Instructions     Call MD for:   Complete by: As directed    Difficulty urinating - worsening bloody urine   Call MD for:  difficulty breathing, headache or visual disturbances   Complete by: As directed    Call MD for:  extreme fatigue   Complete by: As directed    Call MD for:  hives   Complete by: As directed    Call MD for:  persistant dizziness or light-headedness   Complete by: As directed    Call MD for:  persistant nausea and vomiting   Complete by: As directed    Call MD for:  severe uncontrolled pain   Complete by: As directed    Call MD for:  temperature >100.4   Complete by: As directed    Diet - low sodium heart healthy   Complete by: As directed    Increase activity slowly   Complete by: As directed       Allergies as of 09/09/2024       Reactions   Other    Seasonal Allergies that are year round        Medication  List     STOP taking these medications    ciprofloxacin  500 MG tablet Commonly known as: Cipro        TAKE these medications    acetaminophen  500 MG tablet Commonly known as: TYLENOL  Take 500 mg by mouth every 6 (six) hours as needed.   albuterol  108 (90 Base) MCG/ACT inhaler Commonly known as: VENTOLIN  HFA Inhale 2 puffs into the lungs  every 4 (four) hours as needed for wheezing or shortness of breath.   amLODipine -benazepril  10-40 MG capsule Commonly known as: LOTREL Take 1 capsule by mouth in the morning.   ferrous sulfate  325 (65 FE) MG EC tablet Take 325 mg by mouth daily with breakfast.   fluticasone  50 MCG/ACT nasal spray Commonly known as: FLONASE  Place 1 spray into both nostrils daily for 3 days. What changed: when to take this   Lidocaine  4 % Gel Apply small amount to urethral opening twice daily as needed for irritation from foley catheter   metoprolol  succinate 100 MG 24 hr tablet Commonly known as: TOPROL -XL Take 100 mg by mouth in the morning. What changed: how much to take   nicotine  14 mg/24hr patch Commonly known as: NICODERM CQ  - dosed in mg/24 hours Place 1 patch (14 mg total) onto the skin daily. What changed:  when to take this reasons to take this   ofloxacin 0.3 % OTIC solution Commonly known as: FLOXIN Place 5 drops into both ears as needed.   ondansetron  4 MG tablet Commonly known as: ZOFRAN  Take 1 tablet (4 mg total) by mouth every 6 (six) hours as needed for nausea.   Opcon-A  0.027-0.315 % Soln Generic drug: Naphazoline-Pheniramine Place 1 drop into both eyes daily as needed (allergies).   oxyCODONE 5 MG immediate release tablet Commonly known as: Oxy IR/ROXICODONE Take 1 tablet (5 mg total) by mouth every 4 (four) hours as needed for up to 3 days for moderate pain (pain score 4-6).   pantoprazole  40 MG tablet Commonly known as: PROTONIX  Take 40 mg by mouth daily as needed.   tranexamic acid  650 MG Tabs tablet Commonly known as: LYSTEDA  Take 1,300 mg by mouth 3 (three) times daily.   ZyrTEC Allergy 10 MG tablet Generic drug: cetirizine Take 10 mg by mouth in the morning.        Allergies  Allergen Reactions   Other     Seasonal Allergies that are year round    Consultations: Urology  Procedures/Studies: US  PELVIS LIMITED (TRANSABDOMINAL ONLY) Result  Date: 09/01/2024 CLINICAL DATA:  Hematuria. EXAM: LIMITED ULTRASOUND OF PELVIS TECHNIQUE: Limited transabdominal ultrasound examination of the pelvis was performed. COMPARISON:  CT scan 08/22/2024 FINDINGS: Focused ultrasound was performed of the urinary bladder. Dependent debris is seen within the bladder. Bladder largely decompressed by Foley catheter. IMPRESSION: Limited study due to nondistention of the urinary bladder. There does appear to be some dependent debris within the bladder lumen. Correlation for bladder infection recommended. Electronically Signed   By: Camellia Candle M.D.   On: 09/01/2024 09:25   CT ABDOMEN PELVIS W CONTRAST Result Date: 08/22/2024 EXAM: CT ABDOMEN AND PELVIS WITH CONTRAST 08/22/2024 09:22:57 PM TECHNIQUE: CT of the abdomen and pelvis was performed with the administration of intravenous contrast. Multiplanar reformatted images are provided for review. Automated exposure control, iterative reconstruction, and/or weight-based adjustment of the mA/kV was utilized to reduce the radiation dose to as low as reasonably achievable. COMPARISON: 08/04/2024 CLINICAL HISTORY: Hematuria, gross/macroscopic. FINDINGS: LOWER CHEST: No acute abnormality. LIVER: The liver is unremarkable.  GALLBLADDER AND BILE DUCTS: Gallbladder is decompressed. No biliary ductal dilatation. SPLEEN: No acute abnormality. PANCREAS: No acute abnormality. ADRENAL GLANDS: No acute abnormality. KIDNEYS, URETERS AND BLADDER: No stones in the kidneys or ureters. No hydronephrosis. No perinephric or periureteral stranding. Mildly thick walled/irregular bladder with indwelling Foley catheter. GI AND BOWEL: Normal appendix (image 56). Stomach demonstrates no acute abnormality. There is no bowel obstruction. PERITONEUM AND RETROPERITONEUM: No ascites. No free air. Small mildly complex fat-containing midline supraumbilical hernia (sagittal image 107), without associated inflammatory changes. VASCULATURE: Atherosclerotic  calcifications of the abdominal aorta and branch vessels. LYMPH NODES: No lymphadenopathy. REPRODUCTIVE ORGANS: Suspected prior prostatectomy. BONES AND SOFT TISSUES: Mild degenerative changes of the visualized thoracolumbar spine. No focal soft tissue abnormality. IMPRESSION: 1. Mildly thick walled/irregular bladder with indwelling Foley catheter. While this appearance may be related to radiation changes, primary bladder tumor is not excluded. 2. Suspected prior prostatectomy. No findings suspicious for metastatic disease. Electronically signed by: Pinkie Pebbles MD 08/22/2024 09:27 PM EDT RP Workstation: [id number]     Subjective: No acute issues or events overnight denies nausea vomiting diarrhea constipation headache fever chills chest pain   Discharge Exam: Vitals:   09/09/24 0237 09/09/24 0627  BP: (!) 151/78 (!) 157/91  Pulse: 69 74  Resp:    Temp: 97.9 F (36.6 C) 99 F (37.2 C)  SpO2: 100% 100%   Vitals:   09/08/24 2004 09/08/24 2104 09/09/24 0237 09/09/24 0627  BP: 120/69 (!) 146/72 (!) 151/78 (!) 157/91  Pulse: 71 71 69 74  Resp: 18 18    Temp: 97.7 F (36.5 C) 97.9 F (36.6 C) 97.9 F (36.6 C) 99 F (37.2 C)  TempSrc: Oral Oral Oral Oral  SpO2: 94% 97% 100% 100%  Weight:      Height:        General: Pt is alert, awake, not in acute distress Cardiovascular: RRR, S1/S2 +, no rubs, no gallops Respiratory: CTA bilaterally, no wheezing, no rhonchi Abdominal: Soft, NT, ND, bowel sounds + Extremities: no edema, no cyanosis GU: Scant hematuria noted     The results of significant diagnostics from this hospitalization (including imaging, microbiology, ancillary and laboratory) are listed below for reference.     Microbiology: Recent Results (from the past 240 hours)  Urine Culture (for pregnant, neutropenic or urologic patients or patients with an indwelling urinary catheter)     Status: Abnormal   Collection Time: 09/02/24  2:11 PM   Specimen: Urine,  Catheterized  Result Value Ref Range Status   Specimen Description   Final    URINE, CATHETERIZED Performed at [facility], [address], [place], [place] [address]    Special Requests   Final    NONE Performed at [facility], [address] [place], [place] [address]    Culture 70,000 COLONIES/mL PSEUDOMONAS PUTIDA (A)  Final   Report Status 09/04/2024 FINAL  Final   Organism ID, Bacteria PSEUDOMONAS PUTIDA (A)  Final      Susceptibility   Pseudomonas putida - MIC*    MEROPENEM 4 SENSITIVE Sensitive     CIPROFLOXACIN  0.12 SENSITIVE Sensitive     PIP/TAZO Value in next row Sensitive      8 SENSITIVEThis is a modified FDA-approved test that has been validated and its performance characteristics determined by the reporting laboratory.  This laboratory is certified under the Clinical Laboratory Improvement Amendments CLIA as qualified to perform high complexity clinical laboratory testing.    CEFEPIME Value in next row  Sensitive      8 SENSITIVEThis is a modified FDA-approved test that has been validated and its performance characteristics determined by the reporting laboratory.  This laboratory is certified under the Clinical Laboratory Improvement Amendments CLIA as qualified to perform high complexity clinical laboratory testing.    * 70,000 COLONIES/mL PSEUDOMONAS PUTIDA  Culture, blood (Routine X 2) w Reflex to ID Panel     Status: None   Collection Time: 09/02/24  2:15 PM   Specimen: BLOOD LEFT ARM  Result Value Ref Range Status   Specimen Description   Final    BLOOD LEFT ARM Performed at [facility], [address], [place], [place] [address]    Special Requests   Final    BOTTLES DRAWN AEROBIC AND ANAEROBIC Blood Culture adequate volume Performed at [facility], [address], [place], [place] [address]    Culture   Final    NO GROWTH 5 DAYS Performed at [facility], [address],  [place], [place] [address]    Report Status 09/07/2024 FINAL  Final  Culture, blood (Routine X 2) w Reflex to ID Panel     Status: None   Collection Time: 09/02/24  2:15 PM   Specimen: BLOOD LEFT HAND  Result Value Ref Range Status   Specimen Description   Final    BLOOD LEFT HAND Performed at [facility], [address] [place], [place] [address]    Special Requests   Final    BOTTLES DRAWN AEROBIC AND ANAEROBIC Blood Culture adequate volume Performed at [facility], [address] [place], [place] [address]    Culture   Final    NO GROWTH 5 DAYS Performed at [facility], [address] [place], [place] [address]    Report Status 09/07/2024 FINAL  Final     Labs: BNP (last 3 results) No results for input(s): BNP in the last 8760 hours. Basic Metabolic Panel: Recent Labs  Lab 09/03/24 0459 09/06/24 2244 09/07/24 1401  NA 131* 135 136  K 4.3 4.5 4.4  CL 101 103 104  CO2 20* 20* 20*  GLUCOSE 107* 111* 166*  BUN 22 25* 16  CREATININE 1.09 1.74* 1.23  CALCIUM 8.9 9.3 9.4   Liver Function Tests: No results for input(s): AST, ALT, ALKPHOS, BILITOT, PROT, ALBUMIN  in the last 168 hours.  No results for input(s): LIPASE, AMYLASE in the last 168 hours. No results for input(s): AMMONIA in the last 168 hours. CBC: Recent Labs  Lab 09/03/24 0459 09/06/24 2244 09/07/24 1401  WBC 15.8* 14.1* 11.3*  NEUTROABS  --  11.4*  --   HGB 8.3* 6.2* 9.1*  HCT 26.4* 20.8* 29.7*  MCV 83.3 83.2 84.1  PLT 271 456* 496*   Cardiac Enzymes: No results for input(s): CKTOTAL, CKMB, CKMBINDEX, TROPONINI in the last 168 hours.  BNP: Invalid input(s): POCBNP CBG: No results for input(s): GLUCAP in the last 168 hours. D-Dimer No results for input(s): DDIMER in the last 72 hours. Hgb A1c No results for input(s): HGBA1C in the last 72 hours. Lipid Profile No results for input(s): CHOL, HDL, LDLCALC, TRIG,  CHOLHDL, LDLDIRECT in the last 72 hours. Thyroid function studies No results for input(s): TSH, T4TOTAL, T3FREE, THYROIDAB in the last 72 hours.  Invalid input(s): FREET3 Anemia work up No results for input(s): VITAMINB12, FOLATE, FERRITIN, TIBC, IRON, RETICCTPCT in the last 72 hours. Urinalysis    Component Value Date/Time  COLORURINE RED (A) 09/06/2024 2318   APPEARANCEUR CLOUDY (A) 09/06/2024 2318   LABSPEC  09/06/2024 2318    TEST NOT REPORTED DUE TO COLOR INTERFERENCE OF URINE PIGMENT   PHURINE  09/06/2024 2318    TEST NOT REPORTED DUE TO COLOR INTERFERENCE OF URINE PIGMENT   GLUCOSEU (A) 09/06/2024 2318    TEST NOT REPORTED DUE TO COLOR INTERFERENCE OF URINE PIGMENT   HGBUR (A) 09/06/2024 2318    TEST NOT REPORTED DUE TO COLOR INTERFERENCE OF URINE PIGMENT   BILIRUBINUR (A) 09/06/2024 2318    TEST NOT REPORTED DUE TO COLOR INTERFERENCE OF URINE PIGMENT   BILIRUBINUR moderate (A) 02/19/2024 1149   KETONESUR (A) 09/06/2024 2318    TEST NOT REPORTED DUE TO COLOR INTERFERENCE OF URINE PIGMENT   PROTEINUR (A) 09/06/2024 2318    TEST NOT REPORTED DUE TO COLOR INTERFERENCE OF URINE PIGMENT   UROBILINOGEN 1.0 02/19/2024 1149   NITRITE (A) 09/06/2024 2318    TEST NOT REPORTED DUE TO COLOR INTERFERENCE OF URINE PIGMENT   LEUKOCYTESUR (A) 09/06/2024 2318    TEST NOT REPORTED DUE TO COLOR INTERFERENCE OF URINE PIGMENT   Sepsis Labs Recent Labs  Lab 09/03/24 0459 09/06/24 2244 09/07/24 1401  WBC 15.8* 14.1* 11.3*   Microbiology Recent Results (from the past 240 hours)  Urine Culture (for pregnant, neutropenic or urologic patients or patients with an indwelling urinary catheter)     Status: Abnormal   Collection Time: 09/02/24  2:11 PM   Specimen: Urine, Catheterized  Result Value Ref Range Status   Specimen Description   Final    URINE, CATHETERIZED Performed at [facility], [address] [place], [place] [address]     Special Requests   Final    NONE Performed at [facility], [address], [place], [place] [address]    Culture 70,000 COLONIES/mL PSEUDOMONAS PUTIDA (A)  Final   Report Status 09/04/2024 FINAL  Final   Organism ID, Bacteria PSEUDOMONAS PUTIDA (A)  Final      Susceptibility   Pseudomonas putida - MIC*    MEROPENEM 4 SENSITIVE Sensitive     CIPROFLOXACIN  0.12 SENSITIVE Sensitive     PIP/TAZO Value in next row Sensitive      8 SENSITIVEThis is a modified FDA-approved test that has been validated and its performance characteristics determined by the reporting laboratory.  This laboratory is certified under the Clinical Laboratory Improvement Amendments CLIA as qualified to perform high complexity clinical laboratory testing.    CEFEPIME Value in next row Sensitive      8 SENSITIVEThis is a modified FDA-approved test that has been validated and its performance characteristics determined by the reporting laboratory.  This laboratory is certified under the Clinical Laboratory Improvement Amendments CLIA as qualified to perform high complexity clinical laboratory testing.    * 70,000 COLONIES/mL PSEUDOMONAS PUTIDA  Culture, blood (Routine X 2) w Reflex to ID Panel     Status: None   Collection Time: 09/02/24  2:15 PM   Specimen: BLOOD LEFT ARM  Result Value Ref Range Status   Specimen Description   Final    BLOOD LEFT ARM Performed at [facility], [address] [place], [place] [address]    Special Requests   Final    BOTTLES DRAWN AEROBIC AND ANAEROBIC Blood Culture adequate volume Performed at [facility], [address] [place], [place] [address]    Culture   Final    NO GROWTH  5 DAYS Performed at [facility], [address] [place], [place] [address]    Report Status 09/07/2024 FINAL  Final  Culture, blood (Routine X 2) w Reflex to ID Panel     Status: None   Collection Time: 09/02/24  2:15 PM   Specimen: BLOOD LEFT HAND   Result Value Ref Range Status   Specimen Description   Final    BLOOD LEFT HAND Performed at [facility], [address], [place], [place] [address]    Special Requests   Final    BOTTLES DRAWN AEROBIC AND ANAEROBIC Blood Culture adequate volume Performed at [facility], [address] [place], [place] [address]    Culture   Final    NO GROWTH 5 DAYS Performed at [facility], [address] [place], [place] [address]    Report Status 09/07/2024 FINAL  Final     Time coordinating discharge: Over 30 minutes  SIGNED:   Elsie JAYSON Montclair, DO [facility] 09/09/2024, 2:02 PM Pager   If 7PM-7AM, please contact night-coverage [email or web address]

## 2024-09-08 NOTE — Progress Notes (Signed)
 PROGRESS NOTE    Erik Gill  FMW:[id number] DOB: [birth year] DOA: 09/06/2024 PCP: Arloa Elsie SAUNDERS, MD   Brief Narrative:  Erik Gill is an unfortunate 63 y.o. male with medical history significant of GERD, hypertension, prostate cancer who presented to the emergency department due to recurrent and somewhat profound hematuria. Patient has a history of radiation cystitis requiring CBI, he has undergone hyperbaric oxygen  treatments and previous evaluation by urology on multiple occasions over the past 2 years.  At home he was unable to clear his catheter and presented to the hospital with worsening obstructive uropathy due to frank hematuria and likely blood clot.  Hospitalist called for admission, urology called in consult, continue CBI per their expertise in the interim, blood transfusion ordered given profound anemia at intake.  Of note patient was just seen in our facility and discharged on 10/1 for similar episode with marked improvement at the time on CBI, Foley was removed at that time but replaced in the urology office on 10/2 without any complications.  Assessment & Plan:   Principal Problem:   Hematuria  Gross hematuria, recurrent, secondary to radiation cystitis, POA -Multiple episodes of hematuria recently discharged on 10/1 for similar episode -Urology following, appreciate insight and recommendations - plan for fulgeration later today per Urology schedule -Continue CBI, currently undergoing hyperbaric oxygen  treatment as well outpatient, questionable need for additional fulguration at this point -Urine output improving over the past 12 hours, urine continues to drain red with notable sediment   Acute on chronic anemia - In the setting of profound hematuria, status post transfusion 10/5 -Repeat hemoglobin improving - continue to monitor   Malignant neoplasm of prostate  S/p prostatectomy and radiation (2023) Recurrent issues with hematuria and clot obstruction as above    Essential hypertension Resume Toprol  XL, amlodipine , benazepril     Tobacco abuse Encourage cessation Patch ordered  DVT prophylaxis: SCDs Start: 09/07/24 0357 Code Status:   Code Status: Full Code Family Communication: None present  Status is: Inpatient  Dispo: The patient is from: Home              Anticipated d/c is to: Home              Anticipated d/c date is: 24 to 48 hours              Patient currently not medically stable for discharge  Consultants:  Neurology  Procedures:  None  Antimicrobials:  None  Subjective: No acute issues or events overnight, tolerated transfusion quite well, currently tolerating CBI with improving urine output while still bloody no longer dark frank blood.  Objective: Vitals:   09/07/24 2009 09/08/24 0525 09/08/24 0715 09/08/24 1342  BP: (!) 160/90 (!) 170/75 (!) 158/78 (!) 156/76  Pulse: 69 66 65 63  Resp: 18 18 16 18   Temp: 97.9 F (36.6 C) 97.9 F (36.6 C) 98.2 F (36.8 C) 99.1 F (37.3 C)  TempSrc: Oral Oral Oral Oral  SpO2: 100% 100% 99% 100%    Intake/Output Summary (Last 24 hours) at 09/08/2024 1444 Last data filed at 09/08/2024 1400 Gross per 24 hour  Intake 5220 ml  Output 8900 ml  Net -3680 ml   There were no vitals filed for this visit.  Examination:  General:  Pleasantly resting in bed, No acute distress. HEENT:  Normocephalic atraumatic.  Sclerae nonicteric, noninjected.  Extraocular movements intact bilaterally. Neck:  Without mass or deformity.  Trachea is midline. Lungs:  Clear to auscultate bilaterally without rhonchi, wheeze, or  rales. Heart:  Regular rate and rhythm.  Without murmurs, rubs, or gallops. Abdomen:  Soft, nontender, nondistended.  Without guarding or rebound. GU: Foley intact draining dark red urine, CBI ongoing Extremities: Without cyanosis, clubbing, edema, or obvious deformity. Skin:  Warm and dry, no erythema.  Data Reviewed: I have personally reviewed following labs and imaging  studies  CBC: Recent Labs  Lab 09/01/24 1657 09/02/24 0540 09/03/24 0459 09/06/24 2244 09/07/24 1401  WBC 18.2* 18.0* 15.8* 14.1* 11.3*  NEUTROABS  --   --   --  11.4*  --   HGB 9.1* 8.8* 8.3* 6.2* 9.1*  HCT 28.7* 27.6* 26.4* 20.8* 29.7*  MCV 82.9 82.6 83.3 83.2 84.1  PLT 253 242 271 456* 496*   Basic Metabolic Panel: Recent Labs  Lab 09/02/24 0540 09/03/24 0459 09/06/24 2244 09/07/24 1401  NA 132* 131* 135 136  K 4.4 4.3 4.5 4.4  CL 101 101 103 104  CO2 19* 20* 20* 20*  GLUCOSE 112* 107* 111* 166*  BUN 23 22 25* 16  CREATININE 1.26* 1.09 1.74* 1.23  CALCIUM 9.3 8.9 9.3 9.4   GFR: Estimated Creatinine Clearance: 66.2 mL/min (by C-G formula based on SCr of 1.23 mg/dL). Liver Function Tests: No results for input(s): AST, ALT, ALKPHOS, BILITOT, PROT, ALBUMIN  in the last 168 hours.  Coagulation Profile: Recent Labs  Lab 09/07/24 1401  INR 1.2   Cardiac Enzymes: No results for input(s): CKTOTAL, CKMB, CKMBINDEX, TROPONINI in the last 168 hours.  Recent Results (from the past 240 hours)  Urine Culture (for pregnant, neutropenic or urologic patients or patients with an indwelling urinary catheter)     Status: Abnormal   Collection Time: 09/02/24  2:11 PM   Specimen: Urine, Catheterized  Result Value Ref Range Status   Specimen Description   Final    URINE, CATHETERIZED Performed at [facility], [address], [place], [place] [address]    Special Requests   Final    NONE Performed at [facility], [address], [place], [place] [address]    Culture 70,000 COLONIES/mL PSEUDOMONAS PUTIDA (A)  Final   Report Status 09/04/2024 FINAL  Final   Organism ID, Bacteria PSEUDOMONAS PUTIDA (A)  Final      Susceptibility   Pseudomonas putida - MIC*    MEROPENEM 4 SENSITIVE Sensitive     CIPROFLOXACIN  0.12 SENSITIVE Sensitive     PIP/TAZO Value in next row Sensitive      8 SENSITIVEThis is a modified  FDA-approved test that has been validated and its performance characteristics determined by the reporting laboratory.  This laboratory is certified under the Clinical Laboratory Improvement Amendments CLIA as qualified to perform high complexity clinical laboratory testing.    CEFEPIME Value in next row Sensitive      8 SENSITIVEThis is a modified FDA-approved test that has been validated and its performance characteristics determined by the reporting laboratory.  This laboratory is certified under the Clinical Laboratory Improvement Amendments CLIA as qualified to perform high complexity clinical laboratory testing.    * 70,000 COLONIES/mL PSEUDOMONAS PUTIDA  Culture, blood (Routine X 2) w Reflex to ID Panel     Status: None   Collection Time: 09/02/24  2:15 PM   Specimen: BLOOD LEFT ARM  Result Value Ref Range Status   Specimen Description   Final    BLOOD LEFT ARM Performed at [facility], [address] [place], [place] [address]    Special Requests  Final    BOTTLES DRAWN AEROBIC AND ANAEROBIC Blood Culture adequate volume Performed at [facility], [address], [place], [place] [address]    Culture   Final    NO GROWTH 5 DAYS Performed at [facility], [address], [place], [place] [address]    Report Status 09/07/2024 FINAL  Final  Culture, blood (Routine X 2) w Reflex to ID Panel     Status: None   Collection Time: 09/02/24  2:15 PM   Specimen: BLOOD LEFT HAND  Result Value Ref Range Status   Specimen Description   Final    BLOOD LEFT HAND Performed at [facility], [address] [place], [place] [address]    Special Requests   Final    BOTTLES DRAWN AEROBIC AND ANAEROBIC Blood Culture adequate volume Performed at [facility], [address], [place], [place] [address]    Culture   Final    NO GROWTH 5 DAYS Performed at [facility], [address], [place], [place] [address]    Report Status  09/07/2024 FINAL  Final     Radiology Studies: No results found.  Scheduled Meds:  [MAR Hold] amLODipine   10 mg Oral Daily   And   [MAR Hold] benazepril   40 mg Oral Daily   [MAR Hold] Chlorhexidine  Gluconate Cloth  6 each Topical Daily   [MAR Hold] metoprolol  succinate  150 mg Oral Daily   Continuous Infusions:  sodium chloride  irrigation       LOS: 0 days   Time spent:  Elsie JAYSON Montclair, DO [facility]  If 7PM-7AM, please contact night-coverage [email or web address]  09/08/2024, 2:44 PM

## 2024-09-08 NOTE — Anesthesia Procedure Notes (Signed)
 Procedure Name: LMA Insertion Date/Time: 09/08/2024 4:04 PM  Performed by: Nanci Riis, CRNAPre-anesthesia Checklist: Patient identified, Emergency Drugs available, Suction available, Patient being monitored and Timeout performed Patient Re-evaluated:Patient Re-evaluated prior to induction Oxygen  Delivery Method: Circle system utilized Preoxygenation: Pre-oxygenation with 100% oxygen  Induction Type: IV induction LMA: LMA inserted LMA Size: 4.0 Number of attempts: 1 Placement Confirmation: positive ETCO2 and breath sounds checked- equal and bilateral Tube secured with: Tape Dental Injury: Teeth and Oropharynx as per pre-operative assessment

## 2024-09-09 ENCOUNTER — Encounter (HOSPITAL_BASED_OUTPATIENT_CLINIC_OR_DEPARTMENT_OTHER): Admitting: Internal Medicine

## 2024-09-09 ENCOUNTER — Other Ambulatory Visit (HOSPITAL_COMMUNITY): Payer: Self-pay

## 2024-09-09 ENCOUNTER — Encounter (HOSPITAL_COMMUNITY): Payer: Self-pay | Admitting: Urology

## 2024-09-09 DIAGNOSIS — R310 Gross hematuria: Secondary | ICD-10-CM | POA: Diagnosis not present

## 2024-09-09 MED ORDER — OXYCODONE HCL 5 MG PO TABS
5.0000 mg | ORAL_TABLET | ORAL | 0 refills | Status: AC | PRN
  Filled 2024-09-09: qty 18, 3d supply, fill #0

## 2024-09-09 NOTE — Plan of Care (Signed)
   Problem: Clinical Measurements: Goal: Will remain free from infection Outcome: Progressing Goal: Diagnostic test results will improve Outcome: Progressing

## 2024-09-09 NOTE — Progress Notes (Signed)
 Reviewed written d/c instructions w pt and all questions answered. He verbalized understanding. D/ C via w/c w all belongings in stable condition.

## 2024-09-09 NOTE — Progress Notes (Signed)
 Patient ID: Erik Gill, male   DOB: December 17, 1960, 63 y.o.   MRN: [id number]  1 Day Post-Op Subjective: Pt has done well overnight.   Underwent cystoscopy with clot evacuation and fulguration of multiple oozing sites within the bladder.  Findings consistent with diffuse radiation cystitis. Catheter was left out as there was no active bleeding at the end of the procedure.  He has voided well and with mostly yellow urine at his last void.  Objective: Vital signs in last 24 hours: Temp:  [97.3 F (36.3 C)-99.1 F (37.3 C)] 99 F (37.2 C) (10/07 0627) Pulse Rate:  [63-83] 74 (10/07 0627) Resp:  [14-28] 18 (10/06 2104) BP: (120-199)/(69-110) 157/91 (10/07 0627) SpO2:  [87 %-100 %] 100 % (10/07 0627) Weight:  [85 kg] 85 kg (10/06 1447)  Intake/Output from previous day: 10/06 0701 - 10/07 0700 In: 1340 [P.O.:840; I.V.:500] Out: 2100 [Urine:2100] Intake/Output this shift: No intake/output data recorded.  Physical Exam:  General: Alert and oriented   Lab Results: Recent Labs    09/06/24 2244 09/07/24 1401  HGB 6.2* 9.1*  HCT 20.8* 29.7*   BMET Recent Labs    09/06/24 2244 09/07/24 1401  NA 135 136  K 4.5 4.4  CL 103 104  CO2 20* 20*  GLUCOSE 111* 166*  BUN 25* 16  CREATININE 1.74* 1.23  CALCIUM 9.3 9.4     Studies/Results: No results found.  Assessment/Plan: Radiation cystitis:  Ok to d/c home this morning.  I spoke with Darryl at the wound center this morning and Erik Gill can resume his treatments tomorrow.    LOS: 1 day   [name] 09/09/2024, 7:40 AM

## 2024-09-09 NOTE — Plan of Care (Signed)

## 2024-09-10 ENCOUNTER — Encounter (HOSPITAL_BASED_OUTPATIENT_CLINIC_OR_DEPARTMENT_OTHER): Admitting: Internal Medicine

## 2024-09-10 DIAGNOSIS — Z8546 Personal history of malignant neoplasm of prostate: Secondary | ICD-10-CM | POA: Diagnosis not present

## 2024-09-10 DIAGNOSIS — Z923 Personal history of irradiation: Secondary | ICD-10-CM | POA: Diagnosis not present

## 2024-09-10 DIAGNOSIS — N3041 Irradiation cystitis with hematuria: Secondary | ICD-10-CM | POA: Diagnosis present

## 2024-09-10 DIAGNOSIS — C61 Malignant neoplasm of prostate: Secondary | ICD-10-CM | POA: Diagnosis not present

## 2024-09-10 DIAGNOSIS — Z9079 Acquired absence of other genital organ(s): Secondary | ICD-10-CM | POA: Diagnosis not present

## 2024-09-11 ENCOUNTER — Encounter (HOSPITAL_BASED_OUTPATIENT_CLINIC_OR_DEPARTMENT_OTHER): Admitting: Internal Medicine

## 2024-09-11 DIAGNOSIS — N3041 Irradiation cystitis with hematuria: Secondary | ICD-10-CM

## 2024-09-11 DIAGNOSIS — C61 Malignant neoplasm of prostate: Secondary | ICD-10-CM | POA: Diagnosis not present

## 2024-09-12 ENCOUNTER — Encounter (HOSPITAL_BASED_OUTPATIENT_CLINIC_OR_DEPARTMENT_OTHER): Admitting: Internal Medicine

## 2024-09-12 DIAGNOSIS — N3041 Irradiation cystitis with hematuria: Secondary | ICD-10-CM

## 2024-09-12 DIAGNOSIS — C61 Malignant neoplasm of prostate: Secondary | ICD-10-CM

## 2024-09-15 ENCOUNTER — Encounter (HOSPITAL_BASED_OUTPATIENT_CLINIC_OR_DEPARTMENT_OTHER): Admitting: Internal Medicine

## 2024-09-15 DIAGNOSIS — C61 Malignant neoplasm of prostate: Secondary | ICD-10-CM | POA: Diagnosis not present

## 2024-09-15 DIAGNOSIS — N3041 Irradiation cystitis with hematuria: Secondary | ICD-10-CM | POA: Diagnosis not present

## 2024-09-16 ENCOUNTER — Encounter (HOSPITAL_BASED_OUTPATIENT_CLINIC_OR_DEPARTMENT_OTHER): Admitting: Internal Medicine

## 2024-09-16 DIAGNOSIS — N3041 Irradiation cystitis with hematuria: Secondary | ICD-10-CM

## 2024-09-16 DIAGNOSIS — C61 Malignant neoplasm of prostate: Secondary | ICD-10-CM

## 2024-09-17 ENCOUNTER — Encounter (HOSPITAL_BASED_OUTPATIENT_CLINIC_OR_DEPARTMENT_OTHER): Admitting: Internal Medicine

## 2024-09-17 DIAGNOSIS — C61 Malignant neoplasm of prostate: Secondary | ICD-10-CM | POA: Diagnosis not present

## 2024-09-17 DIAGNOSIS — N3041 Irradiation cystitis with hematuria: Secondary | ICD-10-CM | POA: Diagnosis not present

## 2024-09-18 ENCOUNTER — Encounter (HOSPITAL_BASED_OUTPATIENT_CLINIC_OR_DEPARTMENT_OTHER): Admitting: Internal Medicine

## 2024-09-18 DIAGNOSIS — C61 Malignant neoplasm of prostate: Secondary | ICD-10-CM

## 2024-09-18 DIAGNOSIS — N3041 Irradiation cystitis with hematuria: Secondary | ICD-10-CM

## 2024-09-19 ENCOUNTER — Encounter (HOSPITAL_BASED_OUTPATIENT_CLINIC_OR_DEPARTMENT_OTHER): Admitting: Internal Medicine

## 2024-09-19 DIAGNOSIS — C61 Malignant neoplasm of prostate: Secondary | ICD-10-CM

## 2024-09-19 DIAGNOSIS — N3041 Irradiation cystitis with hematuria: Secondary | ICD-10-CM | POA: Diagnosis not present

## 2024-09-22 ENCOUNTER — Encounter (HOSPITAL_BASED_OUTPATIENT_CLINIC_OR_DEPARTMENT_OTHER): Admitting: Internal Medicine

## 2024-09-22 DIAGNOSIS — N3041 Irradiation cystitis with hematuria: Secondary | ICD-10-CM | POA: Diagnosis not present

## 2024-09-22 DIAGNOSIS — C61 Malignant neoplasm of prostate: Secondary | ICD-10-CM | POA: Diagnosis not present

## 2024-09-23 ENCOUNTER — Encounter (HOSPITAL_BASED_OUTPATIENT_CLINIC_OR_DEPARTMENT_OTHER): Admitting: Internal Medicine

## 2024-09-23 ENCOUNTER — Ambulatory Visit (HOSPITAL_COMMUNITY)
Admission: RE | Admit: 2024-09-23 | Discharge: 2024-09-23 | Disposition: A | Discharge status: Home / Self Care | Source: Ambulatory Visit | Attending: Cardiology | Admitting: Cardiology

## 2024-09-23 DIAGNOSIS — Z01818 Encounter for other preprocedural examination: Secondary | ICD-10-CM | POA: Diagnosis present

## 2024-09-23 DIAGNOSIS — N3041 Irradiation cystitis with hematuria: Secondary | ICD-10-CM | POA: Diagnosis not present

## 2024-09-23 DIAGNOSIS — C61 Malignant neoplasm of prostate: Secondary | ICD-10-CM | POA: Diagnosis not present

## 2024-09-23 DIAGNOSIS — Z01810 Encounter for preprocedural cardiovascular examination: Secondary | ICD-10-CM | POA: Diagnosis not present

## 2024-09-23 LAB — ECHOCARDIOGRAM COMPLETE
AR max vel: 2.42 cm2
AV Area VTI: 2.34 cm2
AV Area mean vel: 2.34 cm2
AV Mean grad: 6 mmHg
AV Peak grad: 12 mmHg
Ao pk vel: 1.73 m/s
Area-P 1/2: 3.59 cm2
S' Lateral: 2.9 cm

## 2024-09-24 ENCOUNTER — Ambulatory Visit (HOSPITAL_COMMUNITY)
Admission: RE | Admit: 2024-09-24 | Discharge: 2024-09-24 | Disposition: A | Discharge status: Home / Self Care | Source: Ambulatory Visit | Attending: Student in an Organized Health Care Education/Training Program | Admitting: Student in an Organized Health Care Education/Training Program

## 2024-09-24 ENCOUNTER — Other Ambulatory Visit: Payer: Self-pay | Admitting: Student in an Organized Health Care Education/Training Program

## 2024-09-24 ENCOUNTER — Encounter (HOSPITAL_BASED_OUTPATIENT_CLINIC_OR_DEPARTMENT_OTHER): Admitting: Internal Medicine

## 2024-09-24 DIAGNOSIS — R0989 Other specified symptoms and signs involving the circulatory and respiratory systems: Secondary | ICD-10-CM | POA: Diagnosis present

## 2024-09-24 DIAGNOSIS — R011 Cardiac murmur, unspecified: Secondary | ICD-10-CM

## 2024-09-24 DIAGNOSIS — E785 Hyperlipidemia, unspecified: Secondary | ICD-10-CM

## 2024-09-24 DIAGNOSIS — N3041 Irradiation cystitis with hematuria: Secondary | ICD-10-CM

## 2024-09-24 DIAGNOSIS — Z01818 Encounter for other preprocedural examination: Secondary | ICD-10-CM | POA: Diagnosis present

## 2024-09-24 DIAGNOSIS — R9431 Abnormal electrocardiogram [ECG] [EKG]: Secondary | ICD-10-CM

## 2024-09-24 DIAGNOSIS — Z720 Tobacco use: Secondary | ICD-10-CM

## 2024-09-24 DIAGNOSIS — C61 Malignant neoplasm of prostate: Secondary | ICD-10-CM | POA: Diagnosis not present

## 2024-09-24 DIAGNOSIS — Z13220 Encounter for screening for lipoid disorders: Secondary | ICD-10-CM

## 2024-09-25 ENCOUNTER — Encounter (HOSPITAL_BASED_OUTPATIENT_CLINIC_OR_DEPARTMENT_OTHER): Admitting: Internal Medicine

## 2024-09-25 DIAGNOSIS — N3041 Irradiation cystitis with hematuria: Secondary | ICD-10-CM

## 2024-09-25 DIAGNOSIS — C61 Malignant neoplasm of prostate: Secondary | ICD-10-CM | POA: Diagnosis not present

## 2024-09-26 ENCOUNTER — Encounter (HOSPITAL_BASED_OUTPATIENT_CLINIC_OR_DEPARTMENT_OTHER): Admitting: Internal Medicine

## 2024-09-26 DIAGNOSIS — N3041 Irradiation cystitis with hematuria: Secondary | ICD-10-CM

## 2024-09-26 DIAGNOSIS — C61 Malignant neoplasm of prostate: Secondary | ICD-10-CM

## 2024-09-29 ENCOUNTER — Encounter (HOSPITAL_BASED_OUTPATIENT_CLINIC_OR_DEPARTMENT_OTHER): Admitting: Internal Medicine

## 2024-09-29 DIAGNOSIS — C61 Malignant neoplasm of prostate: Secondary | ICD-10-CM | POA: Diagnosis not present

## 2024-09-29 DIAGNOSIS — N3041 Irradiation cystitis with hematuria: Secondary | ICD-10-CM

## 2024-09-30 ENCOUNTER — Encounter (HOSPITAL_BASED_OUTPATIENT_CLINIC_OR_DEPARTMENT_OTHER): Admitting: Internal Medicine

## 2024-09-30 DIAGNOSIS — N3041 Irradiation cystitis with hematuria: Secondary | ICD-10-CM | POA: Diagnosis not present

## 2024-09-30 DIAGNOSIS — C61 Malignant neoplasm of prostate: Secondary | ICD-10-CM | POA: Diagnosis not present

## 2024-10-01 ENCOUNTER — Encounter (HOSPITAL_BASED_OUTPATIENT_CLINIC_OR_DEPARTMENT_OTHER): Admitting: Internal Medicine

## 2024-10-01 DIAGNOSIS — C61 Malignant neoplasm of prostate: Secondary | ICD-10-CM

## 2024-10-01 DIAGNOSIS — N3041 Irradiation cystitis with hematuria: Secondary | ICD-10-CM

## 2024-10-02 ENCOUNTER — Ambulatory Visit: Payer: Self-pay | Admitting: Student in an Organized Health Care Education/Training Program

## 2024-10-02 ENCOUNTER — Encounter (HOSPITAL_BASED_OUTPATIENT_CLINIC_OR_DEPARTMENT_OTHER): Admitting: Internal Medicine

## 2024-10-02 DIAGNOSIS — R0989 Other specified symptoms and signs involving the circulatory and respiratory systems: Secondary | ICD-10-CM

## 2024-10-02 DIAGNOSIS — D500 Iron deficiency anemia secondary to blood loss (chronic): Secondary | ICD-10-CM | POA: Diagnosis not present

## 2024-10-02 DIAGNOSIS — C61 Malignant neoplasm of prostate: Secondary | ICD-10-CM

## 2024-10-02 DIAGNOSIS — N3041 Irradiation cystitis with hematuria: Secondary | ICD-10-CM | POA: Diagnosis not present

## 2024-10-03 ENCOUNTER — Encounter (HOSPITAL_BASED_OUTPATIENT_CLINIC_OR_DEPARTMENT_OTHER): Admitting: Internal Medicine

## 2024-10-03 DIAGNOSIS — N3041 Irradiation cystitis with hematuria: Secondary | ICD-10-CM

## 2024-10-03 DIAGNOSIS — C61 Malignant neoplasm of prostate: Secondary | ICD-10-CM | POA: Diagnosis not present

## 2024-10-04 ENCOUNTER — Inpatient Hospital Stay (HOSPITAL_COMMUNITY)
Admission: EM | Admit: 2024-10-04 | Discharge: 2024-10-07 | DRG: 669 | Disposition: A | Discharge status: Home / Self Care | Attending: Internal Medicine | Admitting: Internal Medicine

## 2024-10-04 ENCOUNTER — Other Ambulatory Visit: Payer: Self-pay

## 2024-10-04 ENCOUNTER — Observation Stay (HOSPITAL_COMMUNITY)

## 2024-10-04 ENCOUNTER — Encounter (HOSPITAL_COMMUNITY): Payer: Self-pay | Admitting: Internal Medicine

## 2024-10-04 DIAGNOSIS — D72829 Elevated white blood cell count, unspecified: Secondary | ICD-10-CM | POA: Diagnosis present

## 2024-10-04 DIAGNOSIS — E785 Hyperlipidemia, unspecified: Secondary | ICD-10-CM | POA: Diagnosis present

## 2024-10-04 DIAGNOSIS — F17210 Nicotine dependence, cigarettes, uncomplicated: Secondary | ICD-10-CM | POA: Diagnosis present

## 2024-10-04 DIAGNOSIS — I10 Essential (primary) hypertension: Secondary | ICD-10-CM | POA: Diagnosis present

## 2024-10-04 DIAGNOSIS — N1330 Unspecified hydronephrosis: Secondary | ICD-10-CM | POA: Diagnosis present

## 2024-10-04 DIAGNOSIS — Z79899 Other long term (current) drug therapy: Secondary | ICD-10-CM

## 2024-10-04 DIAGNOSIS — E8721 Acute metabolic acidosis: Secondary | ICD-10-CM | POA: Diagnosis present

## 2024-10-04 DIAGNOSIS — R339 Retention of urine, unspecified: Secondary | ICD-10-CM | POA: Diagnosis present

## 2024-10-04 DIAGNOSIS — Z8546 Personal history of malignant neoplasm of prostate: Secondary | ICD-10-CM

## 2024-10-04 DIAGNOSIS — D500 Iron deficiency anemia secondary to blood loss (chronic): Secondary | ICD-10-CM

## 2024-10-04 DIAGNOSIS — N3041 Irradiation cystitis with hematuria: Principal | ICD-10-CM | POA: Diagnosis present

## 2024-10-04 DIAGNOSIS — Z923 Personal history of irradiation: Secondary | ICD-10-CM

## 2024-10-04 DIAGNOSIS — C61 Malignant neoplasm of prostate: Secondary | ICD-10-CM | POA: Diagnosis present

## 2024-10-04 DIAGNOSIS — E869 Volume depletion, unspecified: Secondary | ICD-10-CM | POA: Diagnosis present

## 2024-10-04 DIAGNOSIS — E871 Hypo-osmolality and hyponatremia: Secondary | ICD-10-CM | POA: Diagnosis present

## 2024-10-04 DIAGNOSIS — D649 Anemia, unspecified: Secondary | ICD-10-CM | POA: Diagnosis present

## 2024-10-04 DIAGNOSIS — K219 Gastro-esophageal reflux disease without esophagitis: Secondary | ICD-10-CM | POA: Diagnosis present

## 2024-10-04 DIAGNOSIS — R319 Hematuria, unspecified: Secondary | ICD-10-CM

## 2024-10-04 DIAGNOSIS — Y842 Radiological procedure and radiotherapy as the cause of abnormal reaction of the patient, or of later complication, without mention of misadventure at the time of the procedure: Secondary | ICD-10-CM | POA: Diagnosis present

## 2024-10-04 DIAGNOSIS — R310 Gross hematuria: Secondary | ICD-10-CM | POA: Diagnosis not present

## 2024-10-04 DIAGNOSIS — N179 Acute kidney failure, unspecified: Secondary | ICD-10-CM | POA: Diagnosis present

## 2024-10-04 DIAGNOSIS — D62 Acute posthemorrhagic anemia: Secondary | ICD-10-CM | POA: Diagnosis present

## 2024-10-04 DIAGNOSIS — Z720 Tobacco use: Secondary | ICD-10-CM | POA: Diagnosis present

## 2024-10-04 LAB — URINALYSIS, ROUTINE W REFLEX MICROSCOPIC
Bacteria, UA: NONE SEEN
RBC / HPF: >50 RBC/hpf (ref 0–5)

## 2024-10-04 LAB — TROPONIN T, HIGH SENSITIVITY: Troponin T High Sensitivity: <15 ng/L (ref 0–19)

## 2024-10-04 LAB — CBC WITH DIFFERENTIAL/PLATELET
Abs Immature Granulocytes: 0.28 K/uL — ABNORMAL HIGH (ref 0.00–0.07)
Basophils Absolute: 0.1 K/uL (ref 0.0–0.1)
Basophils Relative: 0 %
Eosinophils Absolute: 0 K/uL (ref 0.0–0.5)
Eosinophils Relative: 0 %
HCT: 16.3 % — ABNORMAL LOW (ref 39.0–52.0)
Hemoglobin: 4.8 g/dL — CL (ref 13.0–17.0)
Immature Granulocytes: 2 %
Lymphocytes Relative: 9 %
Lymphs Abs: 1.3 K/uL (ref 0.7–4.0)
MCH: 23.5 pg — ABNORMAL LOW (ref 26.0–34.0)
MCHC: 29.4 g/dL — ABNORMAL LOW (ref 30.0–36.0)
MCV: 79.9 fL — ABNORMAL LOW (ref 80.0–100.0)
Monocytes Absolute: 1.1 K/uL — ABNORMAL HIGH (ref 0.1–1.0)
Monocytes Relative: 8 %
Neutro Abs: 11.9 K/uL — ABNORMAL HIGH (ref 1.7–7.7)
Neutrophils Relative %: 81 %
Platelets: 393 K/uL (ref 150–400)
RBC: 2.04 MIL/uL — ABNORMAL LOW (ref 4.22–5.81)
RDW: 16.7 % — ABNORMAL HIGH (ref 11.5–15.5)
WBC: 14.7 K/uL — ABNORMAL HIGH (ref 4.0–10.5)
nRBC: 0.1 % (ref 0.0–0.2)

## 2024-10-04 LAB — OSMOLALITY: Osmolality: 266 mosm/kg — ABNORMAL LOW (ref 275–295)

## 2024-10-04 LAB — PREPARE RBC (CROSSMATCH)

## 2024-10-04 LAB — COMPREHENSIVE METABOLIC PANEL WITH GFR
ALT: 15 U/L (ref 0–44)
AST: 22 U/L (ref 15–41)
Albumin: 4.3 g/dL (ref 3.5–5.0)
Alkaline Phosphatase: 76 U/L (ref 38–126)
Anion gap: 14 (ref 5–15)
BUN: 17 mg/dL (ref 8–23)
CO2: 19 mmol/L — ABNORMAL LOW (ref 22–32)
Calcium: 9.9 mg/dL (ref 8.9–10.3)
Chloride: 88 mmol/L — ABNORMAL LOW (ref 98–111)
Creatinine, Ser: 1.36 mg/dL — ABNORMAL HIGH (ref 0.61–1.24)
GFR, Estimated: 59 mL/min — ABNORMAL LOW (ref >60)
Glucose, Bld: 131 mg/dL — ABNORMAL HIGH (ref 70–99)
Potassium: 4.6 mmol/L (ref 3.5–5.1)
Sodium: 121 mmol/L — ABNORMAL LOW (ref 135–145)
Total Bilirubin: 0.3 mg/dL (ref 0.0–1.2)
Total Protein: 6.7 g/dL (ref 6.5–8.1)

## 2024-10-04 LAB — OSMOLALITY, URINE: Osmolality, Ur: 88 mosm/kg — ABNORMAL LOW (ref 300–900)

## 2024-10-04 LAB — HEMOGLOBIN AND HEMATOCRIT, BLOOD
HCT: 22.1 % — ABNORMAL LOW (ref 39.0–52.0)
Hemoglobin: 6.8 g/dL — CL (ref 13.0–17.0)

## 2024-10-04 LAB — SODIUM, URINE, RANDOM: Sodium, Ur: <30 mmol/L

## 2024-10-04 LAB — MRSA NEXT GEN BY PCR, NASAL: MRSA by PCR Next Gen: NOT DETECTED

## 2024-10-04 LAB — SODIUM
Sodium: 126 mmol/L — ABNORMAL LOW (ref 135–145)
Sodium: 127 mmol/L — ABNORMAL LOW (ref 135–145)

## 2024-10-04 LAB — CREATININE, URINE, RANDOM: Creatinine, Urine: 12 mg/dL

## 2024-10-04 MED ORDER — ONDANSETRON HCL 4 MG/2ML IJ SOLN: 4.0000 mg | Freq: Four times a day (QID) | INTRAMUSCULAR | Status: DC | PRN

## 2024-10-04 MED ORDER — SODIUM CHLORIDE 0.9% IV SOLUTION: Freq: Once | INTRAVENOUS | Status: AC

## 2024-10-04 MED ORDER — AMLODIPINE BESYLATE 5 MG PO TABS
5.0000 mg | ORAL_TABLET | Freq: Every day | ORAL | Status: DC
  Administered 2024-10-05 – 2024-10-06 (×2): 5 mg via ORAL
  Filled 2024-10-04 (×2): qty 1

## 2024-10-04 MED ORDER — SODIUM CHLORIDE 0.9 % IR SOLN
3000.0000 mL | Status: DC
  Administered 2024-10-04 – 2024-10-06 (×34): 3000 mL via INTRAVESICAL

## 2024-10-04 MED ORDER — LIDOCAINE VISCOUS HCL 2 % MT SOLN
15.0000 mL | OROMUCOSAL | Status: DC | PRN
  Filled 2024-10-04: qty 15

## 2024-10-04 MED ORDER — CHLORHEXIDINE GLUCONATE CLOTH 2 % EX PADS
6.0000 | MEDICATED_PAD | Freq: Every day | CUTANEOUS | Status: DC
  Administered 2024-10-04 – 2024-10-07 (×3): 6 via TOPICAL

## 2024-10-04 MED ORDER — PANTOPRAZOLE SODIUM 40 MG IV SOLR
40.0000 mg | Freq: Once | INTRAVENOUS | Status: AC
  Administered 2024-10-04: 40 mg via INTRAVENOUS
  Filled 2024-10-04: qty 10

## 2024-10-04 MED ORDER — FERROUS SULFATE 325 (65 FE) MG PO TABS
325.0000 mg | ORAL_TABLET | Freq: Every day | ORAL | Status: DC
  Administered 2024-10-05 – 2024-10-07 (×3): 325 mg via ORAL
  Filled 2024-10-04 (×3): qty 1

## 2024-10-04 MED ORDER — NICOTINE 14 MG/24HR TD PT24
14.0000 mg | MEDICATED_PATCH | Freq: Every day | TRANSDERMAL | Status: DC | PRN
  Administered 2024-10-06: 14 mg via TRANSDERMAL
  Filled 2024-10-04: qty 1

## 2024-10-04 MED ORDER — ALUM & MAG HYDROXIDE-SIMETH 200-200-20 MG/5ML PO SUSP
30.0000 mL | ORAL | Status: DC | PRN
  Administered 2024-10-04: 30 mL via ORAL
  Filled 2024-10-04: qty 30

## 2024-10-04 MED ORDER — PANTOPRAZOLE SODIUM 40 MG PO TBEC
40.0000 mg | DELAYED_RELEASE_TABLET | Freq: Every day | ORAL | Status: DC
  Administered 2024-10-05 – 2024-10-07 (×3): 40 mg via ORAL
  Filled 2024-10-04 (×3): qty 1

## 2024-10-04 MED ORDER — ACETAMINOPHEN 325 MG PO TABS: 650.0000 mg | ORAL_TABLET | Freq: Four times a day (QID) | ORAL | Status: DC | PRN

## 2024-10-04 MED ORDER — LORAZEPAM 2 MG/ML IJ SOLN: 1.0000 mg | Freq: Once | INTRAMUSCULAR | Status: AC

## 2024-10-04 MED ORDER — NAPHAZOLINE-PHENIRAMINE 0.025-0.3 % OP SOLN: 1.0000 [drp] | Freq: Every day | OPHTHALMIC | Status: DC | PRN

## 2024-10-04 MED ORDER — LORAZEPAM 2 MG/ML IJ SOLN
INTRAMUSCULAR | Status: AC
  Administered 2024-10-04: 1 mg via INTRAVENOUS
  Filled 2024-10-04: qty 1

## 2024-10-04 MED ORDER — SODIUM CHLORIDE 0.9 % IV SOLN: INTRAVENOUS | Status: AC

## 2024-10-04 MED ORDER — LORAZEPAM 2 MG/ML IJ SOLN
1.0000 mg | Freq: Once | INTRAMUSCULAR | Status: AC
  Administered 2024-10-04: 1 mg via INTRAVENOUS

## 2024-10-04 MED ORDER — ALUM & MAG HYDROXIDE-SIMETH 200-200-20 MG/5ML PO SUSP
30.0000 mL | Freq: Once | ORAL | Status: AC
  Administered 2024-10-04: 30 mL via ORAL
  Filled 2024-10-04: qty 30

## 2024-10-04 MED ORDER — ACETAMINOPHEN 650 MG RE SUPP: 650.0000 mg | Freq: Four times a day (QID) | RECTAL | Status: DC | PRN

## 2024-10-04 MED ORDER — ONDANSETRON HCL 4 MG PO TABS: 4.0000 mg | ORAL_TABLET | Freq: Four times a day (QID) | ORAL | Status: DC | PRN

## 2024-10-04 MED ORDER — BENZONATATE 100 MG PO CAPS
100.0000 mg | ORAL_CAPSULE | Freq: Two times a day (BID) | ORAL | Status: DC | PRN
  Administered 2024-10-04 – 2024-10-07 (×2): 100 mg via ORAL
  Filled 2024-10-04 (×2): qty 1

## 2024-10-04 MED ORDER — METOPROLOL SUCCINATE ER 25 MG PO TB24
150.0000 mg | ORAL_TABLET | Freq: Every morning | ORAL | Status: DC
  Administered 2024-10-05 – 2024-10-07 (×3): 150 mg via ORAL
  Filled 2024-10-04 (×3): qty 6

## 2024-10-04 MED ORDER — LIDOCAINE HCL 1 % IJ SOLN
INTRAMUSCULAR | Status: AC
  Filled 2024-10-04: qty 20

## 2024-10-04 MED ORDER — ORAL CARE MOUTH RINSE: 15.0000 mL | OROMUCOSAL | Status: DC | PRN

## 2024-10-04 NOTE — H&P (Signed)
 History and Physical    Patient: Erik Gill FMW:[id number] DOB: 26-Mar-1961 DOA: 10/04/2024 DOS: the patient was seen and examined on 10/04/2024 PCP: Arloa Elsie SAUNDERS, MD  Patient coming from: Home  Chief Complaint:  Chief Complaint  Patient presents with   Urinary Retention   Heartburn   HPI: Erik Gill is a 63 y.o. male with medical history significant of GERD, sinus headache, hypertension, hyperlipidemia, nocturia, prostate cancer, urinary retention, gross hematuria, history of tobacco abuse who presented to the emergency department with complaints of hematuria with small clots, urinary retention, mild dysuria and heartburn.  He has been drinking a lot of water  and cranberry juice to avoid blood clots in his bladder.  He was unable to urinate for about 4 hours, but was able to shortly after arriving to the emergency department.   No abdominal pain, nausea, emesis, diarrhea, constipation, melena or hematochezia.  No flank pain or frequency. He denied fever, chills, rhinorrhea, sore throat, wheezing or hemoptysis.  No chest pain, palpitations, diaphoresis, PND, orthopnea or pitting edema of the lower extremities. No polyuria, polydipsia, polyphagia or blurred vision.   ED course: Initial vital signs were temperature 97.8 F, pulse 82, respirations 21, BP 157/63 mmHg and O2 sat 99% on room air.  The patient received 30 mL of Maalox.  Lab work: Urinalysis color interference from large blood amounts.  No bacteria seen on microscopic examination.  CBC showed a white count of 14.7, hemoglobin 14.8 g/dL and platelets 606.  Troponin T was less than 15 ng/L.  CMP showed sodium 121, potassium 4.6, chloride 88 and CO2 19 mmol/L with a normal anion gap.  Glucose under 31, BUN 17 and creatinine 1.36 mg/dL.  LFTs were normal.   Review of Systems: As mentioned in the history of present illness. All other systems reviewed and are negative. Past Medical History:  Diagnosis Date   Elevated PSA    GERD  (gastroesophageal reflux disease)    Headache    sinus related   Hypertension    Nocturia    Prostate cancer (HCC)    Wears glasses    Past Surgical History:  Procedure Laterality Date   CYSTOSCOPY WITH FULGERATION N/A 09/08/2024   Procedure: CYSTOSCOPY, CLOT EVACUATION WITH BLADDER FULGURATION;  Surgeon: Renda Glance, MD;  Location: [facility];  Service: Urology;  Laterality: N/A;   HERNIA REPAIR Right    LYMPHADENECTOMY Bilateral 11/14/2021   Procedure: LYMPHADENECTOMY, PELVIC;  Surgeon: Renda Glance, MD;  Location: [facility];  Service: Urology;  Laterality: Bilateral;   PROSTATE BIOPSY N/A 08/29/2017   Procedure: BIOPSY TRANSRECTAL ULTRASONIC PROSTATE (TUBP);  Surgeon: Devere Lonni Righter, MD;  [facility];  Service: Urology;  Laterality: N/A;   ROBOT ASSISTED LAPAROSCOPIC RADICAL PROSTATECTOMY N/A 11/14/2021   Procedure: XI ROBOTIC ASSISTED LAPAROSCOPIC RADICAL PROSTATECTOMY LEVEL 1;  Surgeon: Renda Glance, MD;  Location: [facility];  Service: Urology;  Laterality: N/A;   Social History:  reports that he has been smoking cigarettes. He has a 0.5 pack-year smoking history. He has never used smokeless tobacco. He reports that he does not currently use alcohol. He reports that he does not use drugs.  Allergies  Allergen Reactions   Other     Seasonal Allergies that are year round    Family History  Problem Relation Age of Onset   Cancer Father        colon cancer   Cancer Brother        prostate   Cancer Brother  prostate   Cancer Cousin        prostate cancer/maternal first cousin    Prior to Admission medications   Medication Sig Start Date End Date Taking? Authorizing Provider  acetaminophen  (TYLENOL ) 500 MG tablet Take 500 mg by mouth every 6 (six) hours as needed.    [provider]  albuterol  (VENTOLIN  HFA) 108 (90 Base) MCG/ACT inhaler Inhale 2 puffs into the lungs every 4 (four) hours as needed for wheezing or shortness of  breath. 06/20/24   Arloa Suzen RAMAN, NP  amLODipine -benazepril  (LOTREL) 10-40 MG capsule Take 1 capsule by mouth in the morning. 09/28/16   [provider]  cetirizine (ZYRTEC ALLERGY) 10 MG tablet Take 10 mg by mouth in the morning.    [provider]  ferrous sulfate  325 (65 FE) MG EC tablet Take 325 mg by mouth daily with breakfast.    [provider]  fluticasone  (FLONASE ) 50 MCG/ACT nasal spray Place 1 spray into both nostrils daily for 3 days. Patient taking differently: Place 1 spray into both nostrils in the morning. 05/19/22 09/07/24  Hazen Darryle BRAVO, FNP  Lidocaine  4 % GEL Apply small amount to urethral opening twice daily as needed for irritation from foley catheter 08/29/24   Logan Ubaldo NOVAK, PA-C  metoprolol  succinate (TOPROL -XL) 100 MG 24 hr tablet Take 100 mg by mouth in the morning. Patient taking differently: Take 150 mg by mouth in the morning. 08/21/22   [provider]  Naphazoline-Pheniramine (OPCON-A ) 0.027-0.315 % SOLN Place 1 drop into both eyes daily as needed (allergies).    [provider]  nicotine  (NICODERM CQ  - DOSED IN MG/24 HOURS) 14 mg/24hr patch Place 1 patch (14 mg total) onto the skin daily. Patient taking differently: Place 14 mg onto the skin daily as needed. 08/20/24   Floretta Mallard, MD  ofloxacin (FLOXIN) 0.3 % OTIC solution Place 5 drops into both ears as needed. 08/26/24   [provider]  ondansetron  (ZOFRAN ) 4 MG tablet Take 1 tablet (4 mg total) by mouth every 6 (six) hours as needed for nausea. 08/24/24   Sherrill Cable Latif, DO  pantoprazole  (PROTONIX ) 40 MG tablet Take 40 mg by mouth daily as needed. 04/21/24   [provider]  tranexamic acid  (LYSTEDA ) 650 MG TABS tablet Take 1,300 mg by mouth 3 (three) times daily. 08/29/24   [provider]    Physical Exam: Vitals:   10/04/24 0703  BP: (!) 157/63  Pulse: 82  Resp: (!) 21  Temp: 97.8 F (36.6 C)  TempSrc: Oral  SpO2: 99%    Physical Exam Vitals and nursing note reviewed.  Constitutional:      General: He is awake. He is not in acute distress.    Appearance: Normal appearance. He is overweight. He is ill-appearing.  HENT:     Head: Normocephalic.     Nose: No rhinorrhea.  Eyes:     General: No scleral icterus.    Pupils: Pupils are equal, round, and reactive to light.  Neck:     Vascular: No JVD.  Cardiovascular:     Rate and Rhythm: Normal rate and regular rhythm.     Heart sounds: S1 normal and S2 normal.  Pulmonary:     Effort: Pulmonary effort is normal.     Breath sounds: Normal breath sounds. No wheezing, rhonchi or rales.  Abdominal:     General: Bowel sounds are normal. There is no distension.     Palpations: Abdomen is soft.  Tenderness: There is no abdominal tenderness. There is no right CVA tenderness or left CVA tenderness.  Musculoskeletal:     Cervical back: Neck supple.     Right lower leg: No edema.     Left lower leg: No edema.  Skin:    General: Skin is warm and dry.     Coloration: Skin is pale.  Neurological:     General: No focal deficit present.     Mental Status: He is alert and oriented to person, place, and time.  Psychiatric:        Mood and Affect: Mood normal.        Behavior: Behavior normal. Behavior is cooperative.     Data Reviewed:  Results are pending, will review when available.  Assessment and Plan: Principal Problem:   Acute on chronic anemia (ABLA) In the setting of:   Gross hematuria Initially complicated, but now resolved   Urinary retention Likely due to radiotherapy for:   Malignant tumor of prostate (HCC) Admit to stepdown/inpatient. Continue IV fluids. Transfused 2 units of PRBC. Monitor H&H. Transfuse further as needed. Continue ceftriaxone  for SBP prophylaxis. Urology has been consulted. -Will follow their recommendations.  Active Problems:   Hyponatremia Hold ACE inhibitor. Continue IV fluids. Free water  intake  restriction. Follow-up sodium level closely. Nephrology consult appreciated.    Essential hypertension Hold ACE inhibitor. Continue amlodipine  5 mg p.o. daily. Continue metoprolol  succinate 150 mg daily.    Tobacco abuse Continue nicotine  patches.    Hyperlipidemia Follow-up with PCP for treatment options.    Advance Care Planning:   Code Status: Full Code   Consults: Urology and nephrology.  Family Communication:   Severity of Illness: The appropriate patient status for this patient is OBSERVATION. Observation status is judged to be reasonable and necessary in order to provide the required intensity of service to ensure the patient's safety. The patient's presenting symptoms, physical exam findings, and initial radiographic and laboratory data in the context of their medical condition is felt to place them at decreased risk for further clinical deterioration. Furthermore, it is anticipated that the patient will be medically stable for discharge from the hospital within 2 midnights of admission.   Author: Alm Dorn Castor, MD 10/04/2024 9:15 AM  For on call review [email or web address].   This document was prepared using Dragon voice recognition software and may contain some unintended transcription errors.

## 2024-10-04 NOTE — ED Provider Notes (Signed)
 [place] EMERGENCY DEPARTMENT AT [facility] Provider Note   CSN: [id number] Arrival date & time: 10/04/24  9344     Patient presents with: Urinary Retention and Heartburn   Erik Gill is a 63 y.o. male f hypertension, bladder cancer s/p radiation presents to the emerged from today for evaluation of hematuria and urinary retention.  Patient reports since 10-20 that he has been having intermittent hematuria with some small clots.  He reports this time progressed, that the clots have become larger and that he has been noticing more blood in his urine is becoming thicker.  He denies any belly pain, nausea, vomiting, fever, dysuria.  Denies any urgency or frequency.  He has been admitted previously for hematuria and needing blood transfusions.  The patient reports that his last urination was around 0300 today.  He has not been able to pass urine since then.  However shortly prior to my arrival to the room, he did pull a large blood clot from his urethra and did not have any residual urine after bladder scan.  Additionally, the patient reports he been feeling more fatigued like he has when he is needed blood transfusion before.  He did report some acid reflux this morning and asked if he gets a medication for this.  He reports that it feels like his already established acid reflux.  No palpitations or chest pain.  Dr. Renda is his urologist.  He still uses tobacco.  No known drug allergies.  Heartburn       Prior to Admission medications   Medication Sig Start Date End Date Taking? Authorizing Provider  acetaminophen  (TYLENOL ) 500 MG tablet Take 500 mg by mouth every 6 (six) hours as needed.    [provider]  albuterol  (VENTOLIN  HFA) 108 (90 Base) MCG/ACT inhaler Inhale 2 puffs into the lungs every 4 (four) hours as needed for wheezing or shortness of breath. 06/20/24   Arloa Suzen RAMAN, NP  amLODipine -benazepril  (LOTREL) 10-40 MG capsule Take 1 capsule by mouth in  the morning. 09/28/16   [provider]  cetirizine (ZYRTEC ALLERGY) 10 MG tablet Take 10 mg by mouth in the morning.    [provider]  ferrous sulfate  325 (65 FE) MG EC tablet Take 325 mg by mouth daily with breakfast.    [provider]  fluticasone  (FLONASE ) 50 MCG/ACT nasal spray Place 1 spray into both nostrils daily for 3 days. Patient taking differently: Place 1 spray into both nostrils in the morning. 05/19/22 09/07/24  Hazen Darryle BRAVO, FNP  Lidocaine  4 % GEL Apply small amount to urethral opening twice daily as needed for irritation from foley catheter 08/29/24   Logan Ubaldo NOVAK, PA-C  metoprolol  succinate (TOPROL -XL) 100 MG 24 hr tablet Take 100 mg by mouth in the morning. Patient taking differently: Take 150 mg by mouth in the morning. 08/21/22   [provider]  Naphazoline-Pheniramine (OPCON-A ) 0.027-0.315 % SOLN Place 1 drop into both eyes daily as needed (allergies).    [provider]  nicotine  (NICODERM CQ  - DOSED IN MG/24 HOURS) 14 mg/24hr patch Place 1 patch (14 mg total) onto the skin daily. Patient taking differently: Place 14 mg onto the skin daily as needed. 08/20/24   Floretta Mallard, MD  ofloxacin (FLOXIN) 0.3 % OTIC solution Place 5 drops into both ears as needed. 08/26/24   [provider]  ondansetron  (ZOFRAN ) 4 MG tablet Take 1 tablet (4 mg total) by mouth every 6 (six) hours as needed  for nausea. 08/24/24   Sherrill Cable Latif, DO  pantoprazole  (PROTONIX ) 40 MG tablet Take 40 mg by mouth daily as needed. 04/21/24   [provider]  tranexamic acid  (LYSTEDA ) 650 MG TABS tablet Take 1,300 mg by mouth 3 (three) times daily. 08/29/24   [provider]    Allergies: Other    Review of Systems  Constitutional:  Negative for chills and fever.  Gastrointestinal:  Positive for heartburn.  Genitourinary:  Positive for difficulty urinating and hematuria. Negative for dysuria, frequency and urgency.     Updated Vital Signs BP (!) 157/63 (BP Location: Right Arm)   Pulse 82   Temp 97.8 F (36.6 C) (Oral)   Resp (!) 21   SpO2 99%   Physical Exam Vitals and nursing note reviewed.  Constitutional:      General: He is not in acute distress.    Appearance: He is not ill-appearing or toxic-appearing.  Eyes:     General: No scleral icterus. Cardiovascular:     Rate and Rhythm: Normal rate.  Pulmonary:     Effort: Pulmonary effort is normal. No respiratory distress.  Abdominal:     Palpations: Abdomen is soft.     Tenderness: There is no abdominal tenderness. There is no guarding or rebound.  Skin:    General: Skin is warm and dry.  Neurological:     Mental Status: He is alert.     (all labs ordered are listed, but only abnormal results are displayed) Labs Reviewed  URINALYSIS, ROUTINE W REFLEX MICROSCOPIC - Abnormal; Notable for the following components:      Result Value   Color, Urine RED (*)    APPearance   (*)    Value: TEST NOT REPORTED DUE TO COLOR INTERFERENCE OF URINE PIGMENT   Glucose, UA   (*)    Value: TEST NOT REPORTED DUE TO COLOR INTERFERENCE OF URINE PIGMENT   Hgb urine dipstick   (*)    Value: TEST NOT REPORTED DUE TO COLOR INTERFERENCE OF URINE PIGMENT   Bilirubin Urine   (*)    Value: TEST NOT REPORTED DUE TO COLOR INTERFERENCE OF URINE PIGMENT   Ketones, ur   (*)    Value: TEST NOT REPORTED DUE TO COLOR INTERFERENCE OF URINE PIGMENT   Protein, ur   (*)    Value: TEST NOT REPORTED DUE TO COLOR INTERFERENCE OF URINE PIGMENT   Nitrite   (*)    Value: TEST NOT REPORTED DUE TO COLOR INTERFERENCE OF URINE PIGMENT   Leukocytes,Ua   (*)    Value: TEST NOT REPORTED DUE TO COLOR INTERFERENCE OF URINE PIGMENT   All other components within normal limits  CBC WITH DIFFERENTIAL/PLATELET - Abnormal; Notable for the following components:   WBC 14.7 (*)    RBC 2.04 (*)    Hemoglobin 4.8 (*)    HCT 16.3 (*)    MCV 79.9 (*)    MCH 23.5 (*)    MCHC 29.4 (*)     RDW 16.7 (*)    Neutro Abs 11.9 (*)    Monocytes Absolute 1.1 (*)    Abs Immature Granulocytes 0.28 (*)    All other components within normal limits  COMPREHENSIVE METABOLIC PANEL WITH GFR - Abnormal; Notable for the following components:   Sodium 121 (*)    Chloride 88 (*)    CO2 19 (*)    Glucose, Bld 131 (*)    Creatinine, Ser 1.36 (*)    GFR, Estimated 59 (*)  All other components within normal limits  URINE CULTURE  SODIUM, URINE, RANDOM  OSMOLALITY  TYPE AND SCREEN  PREPARE RBC (CROSSMATCH)  TROPONIN T, HIGH SENSITIVITY    EKG: None  Radiology: No results found.  .Critical Care  Performed by: Bernis Ernst, PA-C Authorized by: Bernis Ernst, PA-C   Critical care provider statement:    Critical care time (minutes):  45   Critical care was necessary to treat or prevent imminent or life-threatening deterioration of the following conditions:  Metabolic crisis (Anemia)   Critical care was time spent personally by me on the following activities:  Development of treatment plan with patient or surrogate, discussions with consultants, evaluation of patient's response to treatment, examination of patient, ordering and review of laboratory studies, ordering and review of radiographic studies, ordering and performing treatments and interventions, pulse oximetry, re-evaluation of patient's condition, review of old charts and obtaining history from patient or surrogate   Care discussed with: admitting provider   Comments:     Hyponatremia and anemia requiring blood transfusion, multiple consults, and admission    Medications Ordered in the ED  0.9 %  sodium chloride  infusion (Manually program via Guardrails IV Fluids) (has no administration in time range)  acetaminophen  (TYLENOL ) tablet 650 mg (has no administration in time range)    Or  acetaminophen  (TYLENOL ) suppository 650 mg (has no administration in time range)  ondansetron  (ZOFRAN ) tablet 4 mg (has no administration in  time range)    Or  ondansetron  (ZOFRAN ) injection 4 mg (has no administration in time range)  alum & mag hydroxide-simeth (MAALOX/MYLANTA) 200-200-20 MG/5ML suspension 30 mL (30 mLs Oral Given 10/04/24 0745)    Clinical Course as of 10/04/24 0958  Sat Oct 04, 2024  0924 nephrology [RR]    Clinical Course User Index [RR] Bernis Ernst, PA-C   Medical Decision Making Amount and/or Complexity of Data Reviewed Labs: ordered. Radiology: ordered.  Risk OTC drugs. Prescription drug management. Decision regarding hospitalization.   63 y.o. male presents to the ER for evaluation of hematuria with difficulty urinating. Differential diagnosis includes but is not limited to Cystitis, urinary calculi, renal cell carcinoma, bladder cancer, glomerulonephritis, polycystic kidneys, anticoagulant usage, interstitial nephritis, BPH, post radiation changes. Vital signs show mildly elevated blood pressure otherwise unremarkable. Physical exam as noted above.   On previous heart evaluation, patient was recently admitted earlier this month for hematuria and required blood transfusion and CBI.  Will continue with labs and urinalysis.  PVR was obtained in the room after patient passed a clot the size of a green bean and was 0-58ml. Will hold on cath and potential CBI until hearing back from urology.   I independently reviewed and interpreted the patient's labs.  CBC shows white count of 14.7 with a left shift.  Hemoglobin significantly down at 4.8.  Type and screen ordered.  CMP shows new hyponatremia with a sodium 121.  Chloride of 88 with a bicarb of 19.  Glucose at 131.  Creatinine 1.36.  Patient does have variation between his creatinine between 1.09-1.7.  No other electrolyte or LFT abnormality.  Urinalysis shows red urine was unable to be tested due to the amount of blood.  I did order a troponin at abundance of precaution for the patient's fatigue as well as some of the heartburn however this does sound to  be more of acid reflux.  Is less than 15.  I canceled the second troponin as it is not necessary given the time.  Urine culture  is in process for gross hematuria..  I consulted urology and spoke with Dr. Georganne with urology. Given that the patient is able to pass the clots, and isn't retaining urine, he recommends aggressive oral hydration, q4-6 hour PVR checks, and bladder scan to see if there are residual clots. He will see the patient.   I consulted nephrology given the new hyponatremia.  Patient not having any seizure-like behavior.  He does appear to be euvolemic.  I have ordered serum osmolality as well as urine osmolality per requested by Dr. Geralynn.  No additional recommendations at this time.  He will see the patient inpatient.  Discussed need for blood transfusion with patient and member at bedside.  Discussed risk and benefits.  Patient would like to proceed with blood transfusion.  2 units ordered.  Additionally, have ordered ultrasound for the patient to have.  Dr. Celinda to admit.  Portions of this report may have been transcribed using voice recognition software. Every effort was made to ensure accuracy; however, inadvertent computerized transcription errors may be present.    Final diagnoses:  Gross hematuria  Hyponatremia  Blood loss anemia    ED Discharge Orders     None          Bernis Ernst, [place] 10/04/24 1020    Dasie Faden, MD 10/05/24 1019

## 2024-10-04 NOTE — Consult Note (Signed)
 Renal Service Consult Note [facility]  Erik Gill 10/04/2024 Erik JONETTA Fret, MD Requesting Physician: Dr. Celinda  Reason for Consult: Hyponatremia HPI: The patient is a 63 y.o. year-old w/ PMH as below who presented to ED this morning reporting gross hematuria and urinary retention.  Also heartburn.  History of bladder cancer status post radiation, history of hypertension.  No nausea vomiting or diarrhea.  No fever chills or sweats.  Has been admitted previously for hematuria and required blood transfusion.  He is followed by Dr. Renda of urology. In ED BP 157/63, HR 75, RR 20-24, temp 98.  96-100% SpO2 on room air. Na 121, CO2 19, BUN 17, creat 1.36, BUN 17.  Alb 4.3, LFTs okay. WBC 14K, Hb 16. Urine sodium = < 30. UOsm pending, serum osm pending.    Pt seen in room. Had radiation for prostate cancer in 2023. Has had prior bouts of bleeding from the bladder. No hx of kidney failure. Was drinking a lot of water  at home prior to admission.    ROS - denies CP, no joint pain, no HA, no blurry vision, no rash, no diarrhea, no nausea/ vomiting   Past Medical History  Past Medical History:  Diagnosis Date   Allergic rhinitis due to pollen 06/20/2024   Elevated PSA    GERD (gastroesophageal reflux disease)    Headache    sinus related   Hyperlipidemia 06/20/2024   Hypertension    Nocturia    Prostate cancer (HCC)    Wears glasses    Past Surgical History  Past Surgical History:  Procedure Laterality Date   CYSTOSCOPY WITH FULGERATION N/A 09/08/2024   Procedure: CYSTOSCOPY, CLOT EVACUATION WITH BLADDER FULGURATION;  Surgeon: Renda Glance, MD;  Location: [facility];  Service: Urology;  Laterality: N/A;   HERNIA REPAIR Right    LYMPHADENECTOMY Bilateral 11/14/2021   Procedure: LYMPHADENECTOMY, PELVIC;  Surgeon: Renda Glance, MD;  Location: [facility];  Service: Urology;  Laterality: Bilateral;   PROSTATE BIOPSY N/A 08/29/2017   Procedure: BIOPSY TRANSRECTAL ULTRASONIC  PROSTATE (TUBP);  Surgeon: Devere Lonni Righter, MD;  Location: [facility];  Service: Urology;  Laterality: N/A;   ROBOT ASSISTED LAPAROSCOPIC RADICAL PROSTATECTOMY N/A 11/14/2021   Procedure: XI ROBOTIC ASSISTED LAPAROSCOPIC RADICAL PROSTATECTOMY LEVEL 1;  Surgeon: Renda Glance, MD;  Location: [facility];  Service: Urology;  Laterality: N/A;   Family History  Family History  Problem Relation Age of Onset   Cancer Father        colon cancer   Cancer Brother        prostate   Cancer Brother        prostate   Cancer Cousin        prostate cancer/maternal first cousin   Social History  reports that he has been smoking cigarettes. He has a 0.5 pack-year smoking history. He has never used smokeless tobacco. He reports that he does not currently use alcohol. He reports that he does not use drugs. Allergies  Allergies  Allergen Reactions   Other     Seasonal Allergies that are year round   Home medications Prior to Admission medications   Medication Sig Start Date End Date Taking? Authorizing Provider  acetaminophen  (TYLENOL ) 500 MG tablet Take 500 mg by mouth every 6 (six) hours as needed.    [provider]  albuterol  (VENTOLIN  HFA) 108 (90 Base) MCG/ACT inhaler Inhale 2 puffs into the lungs every 4 (four) hours as needed for wheezing or shortness of  breath. 06/20/24   Arloa Suzen RAMAN, NP  amLODipine -benazepril  (LOTREL) 10-40 MG capsule Take 1 capsule by mouth in the morning. 09/28/16   [provider]  cetirizine (ZYRTEC ALLERGY) 10 MG tablet Take 10 mg by mouth in the morning.    [provider]  ferrous sulfate  325 (65 FE) MG EC tablet Take 325 mg by mouth daily with breakfast.    [provider]  fluticasone  (FLONASE ) 50 MCG/ACT nasal spray Place 1 spray into both nostrils daily for 3 days. Patient taking differently: Place 1 spray into both nostrils in the morning. 05/19/22 09/07/24  Hazen Darryle BRAVO, FNP  Lidocaine  4 % GEL Apply  small amount to urethral opening twice daily as needed for irritation from foley catheter 08/29/24   Logan Ubaldo NOVAK, PA-C  metoprolol  succinate (TOPROL -XL) 100 MG 24 hr tablet Take 100 mg by mouth in the morning. Patient taking differently: Take 150 mg by mouth in the morning. 08/21/22   [provider]  Naphazoline-Pheniramine (OPCON-A ) 0.027-0.315 % SOLN Place 1 drop into both eyes daily as needed (allergies).    [provider]  nicotine  (NICODERM CQ  - DOSED IN MG/24 HOURS) 14 mg/24hr patch Place 1 patch (14 mg total) onto the skin daily. Patient taking differently: Place 14 mg onto the skin daily as needed. 08/20/24   Floretta Mallard, MD  ofloxacin (FLOXIN) 0.3 % OTIC solution Place 5 drops into both ears as needed. 08/26/24   [provider]  ondansetron  (ZOFRAN ) 4 MG tablet Take 1 tablet (4 mg total) by mouth every 6 (six) hours as needed for nausea. 08/24/24   Sherrill Cable Latif, DO  pantoprazole  (PROTONIX ) 40 MG tablet Take 40 mg by mouth daily as needed. 04/21/24   [provider]  tranexamic acid  (LYSTEDA ) 650 MG TABS tablet Take 1,300 mg by mouth 3 (three) times daily. 08/29/24   [provider]     Vitals:   10/04/24 1035 10/04/24 1111 10/04/24 1300 10/04/24 1310  BP: 138/62  (!) 164/69 (!) 164/69  Pulse: 75  70 70  Resp: (!) 26  19 20   Temp: 98 F (36.7 C) 98 F (36.7 C)  98.5 F (36.9 C)  TempSrc: Oral Oral  Oral  SpO2: 100%  98% 100%  Weight:  86.1 kg    Height:  5' 8 (1.727 m)     Exam Gen alert, no distress No rash, cyanosis or gangrene Sclera anicteric, throat clear  No jvd or bruits Chest clear bilat to bases, no rales/ wheezing RRR no MRG Abd soft ntnd no mass or ascites +bs, no SP tenderness Foley in place  MS no joint effusions or deformity Ext no LE or UE edema, no other edema Neuro is alert, Ox 3 , nf    Home meds of interest: Amlodipine -benazepril  10-40 every day Toprol  XL 100mg  every  day  Date   Creat  eGFR (ml/min) 2022   1.17 Apr 2024  1.27  > 60 ml/min 8/31- 9/01  1.16 > 1.08           9/19- 08/24/24  1.28 > 1.18           9/26- 08/31/24  1.21- 1.23                       9/29 >> 09/03/24 2.15 >> 1.09 AKI, 34- >60 ml/min  10/04- 09/07/24  1.74 >> 1.23 AKI, 44- > 60 ml/min 10/04/24  1.36  59 ml/min  Renal US  today -> IMPRESSION: 10.5/ 12 cm kidneys w/ moderate bilateral hydronephrosis. Mild bladder distention with dependent intravesical debris. Ureteral jets are not visualized, which may indicate reduced ureteral flow or obstruction. BP's stable to slightly high UA: >50 rbc's, 0-5 wbcs/ epis, rest of UA not reportable due to heavy color UNa < 30 UCr, UOsm pending Labs --> Na 121, creat 1.36, alb 4.3, Hb 16.3    Assessment/ Plan: Hyponatremia: no hx of this prior. Na+ here is 121 in the setting of gross hematuria, h/o radiation cystitis w/ bleeding, also mild AKI w/ bilat hydronephrosis on US , bladder clots. There is no exam evidence of vol overload, CHF or cirrhosis. Looks euvolemic on exam. UNa is low suggesting volume depletion (hypovolemic hyponatremia). Agree w/ 0.9% saline for now and will f/u Na+ levels every 4 hrs to see if this works to get the Na+ up.   AKI: b/l creat is 1.1-1.3 from 2025, eGFR > 60 ml/min. Creat here is 1.3 on admission w/ recent hx of gross hematuria and signs of volume depletion (^^alb and ^^Hb) and also bilat moderate hydronephrosis likely related to blood clots in the bladder. Suspect AKI due to obstruction +/- vol depletion. Urology has been consulted.  Gross hematuria: hx prostate cancer rx'd w/ radiation HTN: taking norvasc , benazepril  and metoprolol  xl at home. Would hold acei for now w/ hyponatremia. Resume other home meds prn per pmd.      Myer Fret  MD [facility] 10/04/2024, 1:19 PM  Recent Labs  Lab 10/04/24 0801  CREATININE 1.36*  K 4.6   Inpatient medications:  Chlorhexidine  Gluconate Cloth  6 each Topical Daily     sodium chloride  150 mL/hr at 10/04/24 1156   acetaminophen  **OR** acetaminophen , alum & mag hydroxide-simeth **AND** lidocaine , ondansetron  **OR** ondansetron  (ZOFRAN ) IV, mouth rinse

## 2024-10-04 NOTE — ED Triage Notes (Signed)
 Pt reports concern for recurrent urinary rentetion. Sts he has been able to urinate since 4 am. Pt also sts concern for heartburn. Sts this started less than 1 hr PTA.

## 2024-10-04 NOTE — Progress Notes (Signed)
  Progress Note   Patient: Erik Gill FMW:[id number] DOB: 29-Mar-1961 DOA: 10/04/2024     0 DOS: the patient was seen and examined on 10/04/2024  The case has been discussed with the ED provider.  We are still waiting for chemistry/troponin level results and urology input on the case.  We will proceed with the admission process once those are done.  Physical Exam: Vitals:   10/04/24 0703  BP: (!) 157/63  Pulse: 82  Resp: (!) 21  Temp: 97.8 F (36.6 C)  TempSrc: Oral  SpO2: 99%   Author: Alm Dorn Castor, MD 10/04/2024 8:19 AM  For on call review [email or web address].

## 2024-10-04 NOTE — Consult Note (Signed)
 Urology Consult   I have been asked to see the patient by Dr. Celinda, for evaluation and management of gross hematuria, radiation cystitis.  HPI:  Erik Gill is a 63 y.o. year old  ED visit + admit this AM - recurrent GH with urinary retention  - Hgb 4.8 (from 9.1 in early Oct)   - RBUS - moderate bilateral hydro w/ mild bladder distension, small to moderate intravesical clot burden  Several ED visits and hospitalizations for [facility]  OR clot evacuation on 09/08/24 (Dr. Renda)  - required initial CBI and blood transfusions x 2u prbc  - diffuse radiation cystitis noted Has been undergoing hyperbaric O2 for radiation cystitis (Sept 2025)  Hx of prostate Ca  - s/p RALP + BPLND in 2022 (Dr. Renda)  - s/p salvage XRT in 2023  On my exam this evening, patient lying comfortably in bed External catheter in place, patient voiding volitionally, pink to darker pink urine in canister S/p 2u pRBC since admission, Hgb responded to 6.8.  He is feeling a bit better Very frustrated with ongoing GH      PMH: Past Medical History:  Diagnosis Date   Allergic rhinitis due to pollen 06/20/2024   Elevated PSA    GERD (gastroesophageal reflux disease)    Headache    sinus related   Hyperlipidemia 06/20/2024   Hypertension    Nocturia    Prostate cancer (HCC)    Wears glasses     Surgical History: Past Surgical History:  Procedure Laterality Date   CYSTOSCOPY WITH FULGERATION N/A 09/08/2024   Procedure: CYSTOSCOPY, CLOT EVACUATION WITH BLADDER FULGURATION;  Surgeon: Renda Glance, MD;  Location: [facility];  Service: Urology;  Laterality: N/A;   HERNIA REPAIR Right    LYMPHADENECTOMY Bilateral 11/14/2021   Procedure: LYMPHADENECTOMY, PELVIC;  Surgeon: Renda Glance, MD;  Location: [facility];  Service: Urology;  Laterality: Bilateral;   PROSTATE BIOPSY N/A 08/29/2017   Procedure: BIOPSY TRANSRECTAL ULTRASONIC PROSTATE (TUBP);  Surgeon: Devere Lonni Righter, MD;  Location: [facility];  Service: Urology;  Laterality: N/A;   ROBOT ASSISTED LAPAROSCOPIC RADICAL PROSTATECTOMY N/A 11/14/2021   Procedure: XI ROBOTIC ASSISTED LAPAROSCOPIC RADICAL PROSTATECTOMY LEVEL 1;  Surgeon: Renda Glance, MD;  Location: [facility];  Service: Urology;  Laterality: N/A;    Allergies:  Allergies  Allergen Reactions   Other     Seasonal Allergies that are year round    Family History: Family History  Problem Relation Age of Onset   Cancer Father        colon cancer   Cancer Brother        prostate   Cancer Brother        prostate   Cancer Cousin        prostate cancer/maternal first cousin    Social History:  reports that he has been smoking cigarettes. He has a 0.5 pack-year smoking history. He has never used smokeless tobacco. He reports that he does not currently use alcohol. He reports that he does not use drugs.  ROS: Negative aside from those stated in the HPI.  Physical Exam: BP (!) 144/63   Pulse 70   Temp (!) 97.3 F (36.3 C) (Axillary)   Resp (!) 22   Ht 5' 8 (1.727 m)   Wt 86.1 kg   SpO2 100%   BMI 28.86 kg/m    Constitutional:  Alert and oriented, No acute distress. Cardiovascular: No clubbing, cyanosis, or edema. Respiratory: Normal respiratory effort,  no increased work of breathing. GI: Abdomen is soft, nontender, nondistended, no abdominal masses GU: external urinary catheter in place, light pink to full pink urine in canister  Lymph: No cervical or inguinal lymphadenopathy. Skin: No rashes, bruises or suspicious lesions. Neurologic: Grossly intact, no focal deficits, moving all 4 extremities. Psychiatric: Normal mood and affect.  Laboratory Data:          Component Ref Range & Units (hover) 17:17 (10/04/24) 08:01 (10/04/24) 3 wk ago (09/07/24) 4 wk ago (09/06/24) 1 mo ago (09/03/24) 1 mo ago (09/02/24) 1 mo ago (09/01/24)  Hemoglobin 6.8 Low Panic  4.8 Low Panic  CM 9.1 Low  CM 6.2 Low Panic  CM 8.3 Low  8.8 Low  9.1 Low      Pertinent Imaging: I have personally reviewed the RBUS - agree with read, mild bilateral hydronephrosis, moderate intravesical debris likely clot burden.     Procedure Note: Foley catheter insertion  Patient was prepped and draped in usual standard fashion. Verbal consent was obtained. Patient was very anxious. We administered 1mg  IV ativan prior to starting. On my initial exam, he appeared to have a narrowed meatus, unable to accommodate a ~21F 3-way catheter. At this point, we ordered 1% lidocaine  plain, and used this as a superficial penile ring block. Another 1mg  Ativan was administered as well. The urethral meatus was gently dilated from 36F-48F with Fleeta buren sounds. Patient tolerated well. Next, we re-attempted a 21F catheter placement but were unsuccessful at the mid penile urethra, with extreme patient discomfort. As such, we downsized to a 27F 3-way catheter, which was passed successfully into the bladder with immediate return of ~300cc pink urine. The bladder was hand irrigated with sterile water , with only return of very small rare clots. The catheter was set to a high tick CBI with clear urine effluent.   Assessment & Plan:   Hx of RALP in 2022, salvage XRT in 2023 Refractory radiation cystitis, frequent clot obstruction Admit 11/1 with GH, Hgb 4.8 RBUS - mild bladder distension, bladder clot, bilateral hydro   -currently undergoing HBO    - s/p OR clot evac 09/08/24  I had a thoughtful conversation with him this evening. He is understandably very frustrated by ongoing hematuria and frequent admissions. While he is currently voiding fairly well, with light pink to full pink urine at worst, I do worry that he may continue to drop his Hct. Additionally, he did show some signs of poor emptying on US  earlier today. I do think he may need another catheter and period of CBI, although he was quite reluctant to proceed. I rechecked a bladder scan during our exam showing ~400cc on average.  Ultimately, he agreed with indwelling catheter and CBI.    - continue CBI overnight, titrate to clear / light pink urine - Vaseline ointment to glans PRN, irritated from meatal dilation  - reassess in AM- if refractory consider intravesical Amicar vs OR takeback for cysto/clot evac /fulguration   Penne JONELLE Skye, MD  [facility] [address] [place], [place] [address] [phone]

## 2024-10-05 ENCOUNTER — Encounter (HOSPITAL_COMMUNITY): Payer: Self-pay

## 2024-10-05 DIAGNOSIS — Z923 Personal history of irradiation: Secondary | ICD-10-CM | POA: Diagnosis not present

## 2024-10-05 DIAGNOSIS — I10 Essential (primary) hypertension: Secondary | ICD-10-CM | POA: Diagnosis present

## 2024-10-05 DIAGNOSIS — E871 Hypo-osmolality and hyponatremia: Secondary | ICD-10-CM | POA: Diagnosis present

## 2024-10-05 DIAGNOSIS — N179 Acute kidney failure, unspecified: Secondary | ICD-10-CM | POA: Diagnosis present

## 2024-10-05 DIAGNOSIS — D62 Acute posthemorrhagic anemia: Secondary | ICD-10-CM | POA: Diagnosis present

## 2024-10-05 DIAGNOSIS — Y842 Radiological procedure and radiotherapy as the cause of abnormal reaction of the patient, or of later complication, without mention of misadventure at the time of the procedure: Secondary | ICD-10-CM | POA: Diagnosis present

## 2024-10-05 DIAGNOSIS — F17210 Nicotine dependence, cigarettes, uncomplicated: Secondary | ICD-10-CM | POA: Diagnosis present

## 2024-10-05 DIAGNOSIS — N3041 Irradiation cystitis with hematuria: Secondary | ICD-10-CM | POA: Diagnosis present

## 2024-10-05 DIAGNOSIS — D72829 Elevated white blood cell count, unspecified: Secondary | ICD-10-CM | POA: Diagnosis present

## 2024-10-05 DIAGNOSIS — E869 Volume depletion, unspecified: Secondary | ICD-10-CM | POA: Diagnosis present

## 2024-10-05 DIAGNOSIS — K219 Gastro-esophageal reflux disease without esophagitis: Secondary | ICD-10-CM | POA: Diagnosis present

## 2024-10-05 DIAGNOSIS — E8721 Acute metabolic acidosis: Secondary | ICD-10-CM | POA: Diagnosis present

## 2024-10-05 DIAGNOSIS — N1330 Unspecified hydronephrosis: Secondary | ICD-10-CM | POA: Diagnosis present

## 2024-10-05 DIAGNOSIS — Z79899 Other long term (current) drug therapy: Secondary | ICD-10-CM | POA: Diagnosis not present

## 2024-10-05 DIAGNOSIS — D500 Iron deficiency anemia secondary to blood loss (chronic): Secondary | ICD-10-CM | POA: Diagnosis present

## 2024-10-05 DIAGNOSIS — Z8546 Personal history of malignant neoplasm of prostate: Secondary | ICD-10-CM | POA: Diagnosis not present

## 2024-10-05 DIAGNOSIS — R310 Gross hematuria: Secondary | ICD-10-CM | POA: Diagnosis present

## 2024-10-05 DIAGNOSIS — E785 Hyperlipidemia, unspecified: Secondary | ICD-10-CM | POA: Diagnosis present

## 2024-10-05 LAB — COMPREHENSIVE METABOLIC PANEL WITH GFR
ALT: 13 U/L (ref 0–44)
AST: 20 U/L (ref 15–41)
Albumin: 4.1 g/dL (ref 3.5–5.0)
Alkaline Phosphatase: 77 U/L (ref 38–126)
Anion gap: 10 (ref 5–15)
BUN: 10 mg/dL (ref 8–23)
CO2: 21 mmol/L — ABNORMAL LOW (ref 22–32)
Calcium: 9.1 mg/dL (ref 8.9–10.3)
Chloride: 102 mmol/L (ref 98–111)
Creatinine, Ser: 1.09 mg/dL (ref 0.61–1.24)
GFR, Estimated: >60 mL/min (ref >60)
Glucose, Bld: 109 mg/dL — ABNORMAL HIGH (ref 70–99)
Potassium: 4.3 mmol/L (ref 3.5–5.1)
Sodium: 133 mmol/L — ABNORMAL LOW (ref 135–145)
Total Bilirubin: 1 mg/dL (ref 0.0–1.2)
Total Protein: 6.6 g/dL (ref 6.5–8.1)

## 2024-10-05 LAB — CBC
HCT: 29.9 % — ABNORMAL LOW (ref 39.0–52.0)
Hemoglobin: 9.8 g/dL — ABNORMAL LOW (ref 13.0–17.0)
MCH: 26.8 pg (ref 26.0–34.0)
MCHC: 32.8 g/dL (ref 30.0–36.0)
MCV: 81.7 fL (ref 80.0–100.0)
Platelets: 353 K/uL (ref 150–400)
RBC: 3.66 MIL/uL — ABNORMAL LOW (ref 4.22–5.81)
RDW: 15.5 % (ref 11.5–15.5)
WBC: 10.6 K/uL — ABNORMAL HIGH (ref 4.0–10.5)
nRBC: 0.2 % (ref 0.0–0.2)

## 2024-10-05 LAB — MAGNESIUM: Magnesium: 2.4 mg/dL (ref 1.7–2.4)

## 2024-10-05 LAB — URINE CULTURE: Culture: NO GROWTH

## 2024-10-05 LAB — SODIUM: Sodium: 130 mmol/L — ABNORMAL LOW (ref 135–145)

## 2024-10-05 NOTE — Plan of Care (Signed)
  Problem: Clinical Measurements: Goal: Will remain free from infection Outcome: Progressing Goal: Diagnostic test results will improve Outcome: Progressing Goal: Cardiovascular complication will be avoided Outcome: Progressing   Problem: Activity: Goal: Risk for activity intolerance will decrease Outcome: Progressing   Problem: Nutrition: Goal: Adequate nutrition will be maintained Outcome: Progressing   Problem: Elimination: Goal: Will not experience complications related to bowel motility Outcome: Progressing Goal: Will not experience complications related to urinary retention Outcome: Progressing

## 2024-10-05 NOTE — Progress Notes (Signed)
 Nursing noted acute change in efflux this afternoon, heavy red with significant clots. Required manual irrigation. Occurred shortly after ambulation and position changes. I swung by room and interrogated cath- several rounds of hand irrigation. On my exam, Light pink urine without clots, seems to have quickly resolved. Reviewed treatment plan with patient and nurse. Reviewed role of intravesical Amicar- could start tonight, I would need to check with pharmacy. Ultimately, we decided on continued slow-medium drip CBI tonight. If recurrent significant bleeding - will need to discuss Amicar vs cysto/fulg in the AM.

## 2024-10-05 NOTE — Plan of Care (Deleted)
 Patient experienced blood clots in CBI tubing, which were flushed out. Patient is progressing otherwise in regard to the selected clinical measurements.

## 2024-10-05 NOTE — Progress Notes (Signed)
   Interval: HD2 for GH, clot retention - radiation cystitis  Started CBI overnight, high down to medium rate - light pink urine this AM  Patient feeling very well, encouraged S/p additional 2u pRBC - Hgb 9.8 (from 6.8)   Physical Exam: BP 138/70   Pulse 68   Temp 98.3 F (36.8 C) (Oral)   Resp (!) 21   Ht 5' 8 (1.727 m)   Wt 86.1 kg   SpO2 100%   BMI 28.86 kg/m    Constitutional:  Alert and oriented, No acute distress. Respiratory: Normal respiratory effort, no increased work of breathing. GI: Abdomen is soft, non-tender, non-distended GU: 59F 3-way catheter in place, CBI running at medium tick - light pink urine in tubing   Laboratory Data: S/p additional 2u pRBC - Hgb 9.8 (from 6.8)   Pertinent Imaging: N/A  Assessment & Plan:   Hx of RALP in 2022, salvage XRT in 2023 Refractory radiation cystitis, frequent clot obstruction Admit 11/1 with GH, Hgb 4.8 RBUS - mild bladder distension, bladder clot, bilateral hydro   - CBI started (10/04/24)   - Hgb 9.8 (s/p 4u pRBC) from 4.8    CBI reassuring this AM, although has been on a higher rate overnight. Nursing weaned to a slow medium, with a deeper pink this AM. I weaned back to slow on my exam, will continue to observe. I am optimistic we may be able to wean to clamp this afternoon, possible tomorrow AM. If persistent hematuria, will need to consider intravesical therapy vs return to OR.   - continue CBI- okay to wean rate to light pink. If sustained clear efflux on slow drip, may consider clamping this afternoon vs early tomorrow AM  - Would prefer to remove catheter once hematuria resolves / stabilizes. I think indwelling catheter may continue to irritate friable radiated mucosa long term. I do not think he needs extended catheterization for his mild retention at admission.   Penne JONELLE Skye, MD

## 2024-10-05 NOTE — Progress Notes (Signed)
 PROGRESS NOTE    Erik Gill  FMW:[id number] DOB: 1961/06/26 DOA: 10/04/2024 PCP: Arloa Elsie SAUNDERS, MD   Brief Narrative:  63 year old male with history of hypertension, hyperlipidemia, prostate cancer, hematuria, urinary retention, tobacco abuse presented with hematuria with small clots, urinary retention and dysuria.  On presentation, UA showed hematuria.  WBC of 14.7, hemoglobin of 4.8, platelets of 393 with sodium of 121 and creatinine of 1.36.  LFTs were normal.  Nephrology and urology were consulted.  He was started on IV fluids and CBI.  He received 4 units packed red cells.  Assessment & Plan:   Acute on chronic blood loss anemia Gross hematuria Acute urinary retention Moderate bilateral hydronephrosis: Possibly obstructive History of prostate cancer - Presented with hemoglobin of 4.8 with hematuria and clots.  Received 4 units packed red cells.  Hemoglobin 9.8 this morning.  Monitor H&H.  Urology following: Currently on CBI.  Follow recommendations. - Continue Foley catheter  Hyponatremia -Possibly hypovolemic hyponatremia.  Sodium 121 on presentation.  -Nephrology following.  Treated with IV fluids.  Sodium 133 this morning  Acute metabolic acidosis -improving.  Monitor  Leukocytosis -Resolved  AKI - Improving.  Hypertension - Monitor blood pressure.  Continue amlodipine  and metoprolol .  Benazepril  on hold  GERD -continue PPI  Tobacco abuse -Continue nicotine  patches as needed  Hyperlipidemia - Follow-up with PCP for treatment options   DVT prophylaxis: SCDs Code Status: Full Family Communication: None at bedside Disposition Plan: Status is: Observation The patient will require care spanning > 2 midnights and should be moved to inpatient because: Of severity of illness    Consultants: Urology/nephrology  Procedures: None Antimicrobials: None   Subjective: Patient seen and examined at bedside.  Feels better.  Denies any worsening Abrol pain,  fever or vomiting.  Objective: Vitals:   10/05/24 0504 10/05/24 0600 10/05/24 0650 10/05/24 0938  BP: (!) 176/78 138/70  (!) 141/110  Pulse: 84 68  78  Resp: (!) 28 (!) 21    Temp:   98.3 F (36.8 C)   TempSrc:   Oral   SpO2: 100% 100%    Weight:      Height:        Intake/Output Summary (Last 24 hours) at 10/05/2024 1023 Last data filed at 10/05/2024 0936 Gross per 24 hour  Intake 6121.71 ml  Output 37574 ml  Net -56303.29 ml   Filed Weights   10/04/24 1111 10/05/24 0306  Weight: 86.1 kg 86.1 kg    Examination:  General exam: Appears calm and comfortable  Respiratory system: Bilateral decreased breath sounds at bases Cardiovascular system: S1 & S2 heard, Rate controlled Gastrointestinal system: Abdomen is nondistended, soft and nontender. Normal bowel sounds heard. Extremities: No cyanosis, clubbing, edema  Central nervous system: Alert and oriented. No focal neurological deficits. Moving extremities Skin: No rashes, lesions or ulcers Psychiatry: Judgement and insight appear normal. Mood & affect appropriate. Genitourinary: Foley catheter present draining slightly pinkish urine    Data Reviewed: I have personally reviewed following labs and imaging studies  CBC: Recent Labs  Lab 10/04/24 0801 10/04/24 1717 10/05/24 0231  WBC 14.7*  --  10.6*  NEUTROABS 11.9*  --   --   HGB 4.8* 6.8* 9.8*  HCT 16.3* 22.1* 29.9*  MCV 79.9*  --  81.7  PLT 393  --  353   Basic Metabolic Panel: Recent Labs  Lab 10/04/24 0801 10/04/24 1607 10/04/24 1946 10/04/24 2345 10/05/24 0231  NA 121* 126* 127* 130* 133*  K 4.6  --   --   --  4.3  CL 88*  --   --   --  102  CO2 19*  --   --   --  21*  GLUCOSE 131*  --   --   --  109*  BUN 17  --   --   --  10  CREATININE 1.36*  --   --   --  1.09  CALCIUM 9.9  --   --   --  9.1   GFR: Estimated Creatinine Clearance: 75 mL/min (by C-G formula based on SCr of 1.09 mg/dL). Liver Function Tests: Recent Labs  Lab 10/04/24 0801  10/05/24 0231  AST 22 20  ALT 15 13  ALKPHOS 76 77  BILITOT 0.3 1.0  PROT 6.7 6.6  ALBUMIN  4.3 4.1   No results for input(s): LIPASE, AMYLASE in the last 168 hours. No results for input(s): AMMONIA in the last 168 hours. Coagulation Profile: No results for input(s): INR, PROTIME in the last 168 hours. Cardiac Enzymes: No results for input(s): CKTOTAL, CKMB, CKMBINDEX, TROPONINI in the last 168 hours. BNP (last 3 results) No results for input(s): PROBNP in the last 8760 hours. HbA1C: No results for input(s): HGBA1C in the last 72 hours. CBG: No results for input(s): GLUCAP in the last 168 hours. Lipid Profile: No results for input(s): CHOL, HDL, LDLCALC, TRIG, CHOLHDL, LDLDIRECT in the last 72 hours. Thyroid Function Tests: No results for input(s): TSH, T4TOTAL, FREET4, T3FREE, THYROIDAB in the last 72 hours. Anemia Panel: No results for input(s): VITAMINB12, FOLATE, FERRITIN, TIBC, IRON, RETICCTPCT in the last 72 hours. Sepsis Labs: No results for input(s): PROCALCITON, LATICACIDVEN in the last 168 hours.  Recent Results (from the past 240 hours)  MRSA Next Gen by PCR, Nasal     Status: None   Collection Time: 10/04/24 11:17 AM   Specimen: Nasal Mucosa; Nasal Swab  Result Value Ref Range Status   MRSA by PCR Next Gen NOT DETECTED NOT DETECTED Final    Comment: (NOTE) The GeneXpert MRSA Assay (FDA approved for NASAL specimens only), is one component of a comprehensive MRSA colonization surveillance program. It is not intended to diagnose MRSA infection nor to guide or monitor treatment for MRSA infections. Test performance is not FDA approved in patients less than 65 years old. Performed at [facility], [address], [place], [place] [address]          Radiology Studies: US  Renal Result Date: 10/04/2024 EXAM: US  Retroperitoneum Complete, Renal. CLINICAL HISTORY: Hematuria and  possible clot retention. TECHNIQUE: Real-time ultrasound of the retroperitoneum (complete) with image documentation. COMPARISON: None provided. FINDINGS: RIGHT KIDNEY: Measures 10.5 x 6.0 x 6.3 cm. Moderate bilateral hydronephrosis is noted. No renal stone or mass visualized. LEFT KIDNEY: Measures 12.1 x 5.5 x 5.0 cm. Moderate bilateral hydronephrosis is noted. No renal stone or mass visualized. BLADDER: Ureteral jets are not visualized. Some debris is noted in the dependent portion of the urinary bladder. Mild urinary bladder distention is noted. IMPRESSION: 1. Moderate bilateral hydronephrosis. 2. Mild urinary bladder distention with dependent intravesical debris. Ureteral jets are not visualized, which may indicate reduced ureteral flow or obstruction. Electronically signed by: Lynwood Seip MD 10/04/2024 10:44 AM EDT RP Workstation: [id number]        Scheduled Meds:  amLODipine   5 mg Oral Daily   Chlorhexidine  Gluconate Cloth  6 each Topical Daily   ferrous sulfate   325 mg Oral Q breakfast   metoprolol  succinate  150 mg Oral q AM   pantoprazole   40 mg Oral Daily   Continuous Infusions:  sodium chloride  irrigation            Sophie Mao, MD [facility] 10/05/2024, 10:23 AM

## 2024-10-05 NOTE — Progress Notes (Signed)
 [facility] Progress Note  Subjective:    Vitals:   10/05/24 0504 10/05/24 0600 10/05/24 0650 10/05/24 0938  BP: (!) 176/78 138/70  (!) 141/110  Pulse: 84 68  78  Resp: (!) 28 (!) 21    Temp:   98.3 F (36.8 C)   TempSrc:   Oral   SpO2: 100% 100%    Weight:      Height:        Exam: Gen alert, no distress No jvd or bruits Chest clear bilat to base RRR no MRG Abd soft ntnd no mass or ascites +bs Ext no LE edema Neuro is alert, Ox 3 , nf 34F 3-way catheter in place w/ CBI ongoing       Home meds of interest: Amlodipine -benazepril  10-40 every day Toprol  XL 100mg  every day   Date                             Creat               eGFR (ml/min) 2022                            1.17 Apr 2024                    1.27                 > 60 ml/min 8/31- 9/01                    1.16 > 1.08                9/19- 08/24/24               1.28 > 1.18                9/26- 08/31/24               1.21- 1.23                                            9/29 >> 09/03/24         2.15 >> 1.09    AKI, 34- >60 ml/min     10/04- 09/07/24           1.74 >> 1.23    AKI, 44- > 60 ml/min 10/04/24                      1.36                 59 ml/min  11/02   1.09                                      Renal US  today -> IMPRESSION: 10.5/ 12 cm kidneys w/ moderate bilateral hydronephrosis. Mild bladder distention with dependent intravesical debris. Ureteral jets are not visualized, which may indicate reduced ureteral flow or obstruction. BP's stable to slightly high UA: >50 rbc's, 0-5 wbcs/ epis, rest of UA not reportable due to heavy color UNa < 30 UCr 12, UOsm 88      Assessment/ Plan: Hyponatremia: no hx of this prior. Na+ here  is 121 in the setting of gross hematuria, h/o radiation cystitis w/ bleeding, also mild AKI w/ bilat hydronephrosis on US , bladder clots. There is no exam evidence of vol overload, CHF or cirrhosis. Looks euvolemic on exam. UNa is low suggesting volume depletion  (hypovolemic hyponatremia). Pt was continued on isotonic saline at 150 cc/hr and Na+ was up to 127 last night and 133 this am. Simple volume depletion was the cause, and has now been corrected. IVFs have been stopped and pt is eating /drinking well. No further suggestions, will sign off.  AKI: b/l creat is 1.1-1.3 from 2025, eGFR > 60 ml/min. Creat here is 1.3 on admission w/ recent hx of gross hematuria and signs of volume depletion cassius Seward) and also bilat moderate hydronephrosis likely related to blood clots in the bladder. Suspect AKI due to obstruction + vol depletion. Urology is following. Creat corrected down to 1.0 today w/ IVF's and rx of bladder clots per urology. Can't calculate UOP due to bladder irrigation. No other suggestions.  Gross hematuria: hx prostate cancer rx'd w/ radiation HTN: taking norvasc , benazepril  and metoprolol  xl at home. Would hold acei for 1-2 weeks due to AKI + hyponatremia. May resume other home meds prn per pmd.              Myer Fret MD  [facility] 10/05/2024, 10:40 AM  Recent Labs  Lab 10/04/24 0801 10/04/24 1717 10/05/24 0231  HGB 4.8* 6.8* 9.8*  ALBUMIN  4.3  --  4.1  CALCIUM 9.9  --  9.1  CREATININE 1.36*  --  1.09  K 4.6  --  4.3   No results for input(s): IRON, TIBC, FERRITIN in the last 168 hours. Inpatient medications:  amLODipine   5 mg Oral Daily   Chlorhexidine  Gluconate Cloth  6 each Topical Daily   ferrous sulfate   325 mg Oral Q breakfast   metoprolol  succinate  150 mg Oral q AM   pantoprazole   40 mg Oral Daily    sodium chloride  irrigation     acetaminophen  **OR** acetaminophen , alum & mag hydroxide-simeth **AND** lidocaine , benzonatate , naphazoline-pheniramine, nicotine , ondansetron  **OR** ondansetron  (ZOFRAN ) IV, mouth rinse

## 2024-10-06 ENCOUNTER — Encounter (HOSPITAL_COMMUNITY): Admission: EM | Disposition: A | Payer: Self-pay | Source: Home / Self Care | Attending: Internal Medicine

## 2024-10-06 ENCOUNTER — Inpatient Hospital Stay (HOSPITAL_COMMUNITY): Admitting: Certified Registered Nurse Anesthetist

## 2024-10-06 ENCOUNTER — Encounter (HOSPITAL_BASED_OUTPATIENT_CLINIC_OR_DEPARTMENT_OTHER): Admitting: Internal Medicine

## 2024-10-06 ENCOUNTER — Encounter (HOSPITAL_COMMUNITY): Payer: Self-pay | Admitting: *Deleted

## 2024-10-06 DIAGNOSIS — N3041 Irradiation cystitis with hematuria: Secondary | ICD-10-CM

## 2024-10-06 DIAGNOSIS — F17210 Nicotine dependence, cigarettes, uncomplicated: Secondary | ICD-10-CM

## 2024-10-06 DIAGNOSIS — I10 Essential (primary) hypertension: Secondary | ICD-10-CM | POA: Diagnosis not present

## 2024-10-06 DIAGNOSIS — R310 Gross hematuria: Secondary | ICD-10-CM | POA: Diagnosis not present

## 2024-10-06 HISTORY — PX: CYSTOSCOPY WITH FULGERATION: SHX6638

## 2024-10-06 LAB — BPAM RBC
Blood Product Expiration Date: 202511272359
Blood Product Expiration Date: 202511272359
Blood Product Expiration Date: 202511272359
Blood Product Expiration Date: 202511272359
ISSUE DATE / TIME: 202511011002
ISSUE DATE / TIME: 202511011328
ISSUE DATE / TIME: 202511012034
ISSUE DATE / TIME: 202511012300
Unit Type and Rh: 5100
Unit Type and Rh: 5100
Unit Type and Rh: 5100
Unit Type and Rh: 5100

## 2024-10-06 LAB — TYPE AND SCREEN
ABO/RH(D): O POS
Antibody Screen: NEGATIVE
Unit division: 0
Unit division: 0
Unit division: 0
Unit division: 0

## 2024-10-06 LAB — CBC WITH DIFFERENTIAL/PLATELET
Abs Immature Granulocytes: 0.05 K/uL (ref 0.00–0.07)
Basophils Absolute: 0.1 K/uL (ref 0.0–0.1)
Basophils Relative: 1 %
Eosinophils Absolute: 0.1 K/uL (ref 0.0–0.5)
Eosinophils Relative: 0 %
HCT: 31.2 % — ABNORMAL LOW (ref 39.0–52.0)
Hemoglobin: 9.4 g/dL — ABNORMAL LOW (ref 13.0–17.0)
Immature Granulocytes: 0 %
Lymphocytes Relative: 8 %
Lymphs Abs: 1 K/uL (ref 0.7–4.0)
MCH: 25.8 pg — ABNORMAL LOW (ref 26.0–34.0)
MCHC: 30.1 g/dL (ref 30.0–36.0)
MCV: 85.7 fL (ref 80.0–100.0)
Monocytes Absolute: 1.2 K/uL — ABNORMAL HIGH (ref 0.1–1.0)
Monocytes Relative: 9 %
Neutro Abs: 10 K/uL — ABNORMAL HIGH (ref 1.7–7.7)
Neutrophils Relative %: 82 %
Platelets: 397 K/uL (ref 150–400)
RBC: 3.64 MIL/uL — ABNORMAL LOW (ref 4.22–5.81)
RDW: 16.2 % — ABNORMAL HIGH (ref 11.5–15.5)
WBC: 12.3 K/uL — ABNORMAL HIGH (ref 4.0–10.5)
nRBC: 0 % (ref 0.0–0.2)

## 2024-10-06 LAB — BASIC METABOLIC PANEL WITH GFR
Anion gap: 10 (ref 5–15)
BUN: 13 mg/dL (ref 8–23)
CO2: 21 mmol/L — ABNORMAL LOW (ref 22–32)
Calcium: 9.3 mg/dL (ref 8.9–10.3)
Chloride: 104 mmol/L (ref 98–111)
Creatinine, Ser: 1.05 mg/dL (ref 0.61–1.24)
GFR, Estimated: >60 mL/min (ref >60)
Glucose, Bld: 108 mg/dL — ABNORMAL HIGH (ref 70–99)
Potassium: 4.4 mmol/L (ref 3.5–5.1)
Sodium: 135 mmol/L (ref 135–145)

## 2024-10-06 SURGERY — CYSTOSCOPY, WITH BLADDER FULGURATION
Anesthesia: General | Site: Bladder

## 2024-10-06 MED ORDER — CARMEX CLASSIC LIP BALM EX OINT
TOPICAL_OINTMENT | CUTANEOUS | Status: AC
  Filled 2024-10-06: qty 10

## 2024-10-06 MED ORDER — ONDANSETRON HCL 4 MG/2ML IJ SOLN
INTRAMUSCULAR | Status: DC | PRN
  Administered 2024-10-06: 4 mg via INTRAVENOUS

## 2024-10-06 MED ORDER — LIDOCAINE HCL (CARDIAC) PF 100 MG/5ML IV SOSY
PREFILLED_SYRINGE | INTRAVENOUS | Status: DC | PRN
  Administered 2024-10-06: 100 mg via INTRAVENOUS

## 2024-10-06 MED ORDER — LIDOCAINE HCL (PF) 2 % IJ SOLN
INTRAMUSCULAR | Status: AC
Start: 2024-10-06 — End: 2024-10-06
  Filled 2024-10-06: qty 5

## 2024-10-06 MED ORDER — CEFAZOLIN SODIUM-DEXTROSE 2-3 GM-%(50ML) IV SOLR
INTRAVENOUS | Status: DC | PRN
  Administered 2024-10-06: 2 g via INTRAVENOUS

## 2024-10-06 MED ORDER — ACETAMINOPHEN 500 MG PO TABS
1000.0000 mg | ORAL_TABLET | Freq: Once | ORAL | Status: AC
  Administered 2024-10-06: 1000 mg via ORAL

## 2024-10-06 MED ORDER — HYOSCYAMINE SULFATE 0.125 MG SL SUBL: 0.1250 mg | SUBLINGUAL_TABLET | Freq: Four times a day (QID) | SUBLINGUAL | Status: DC | PRN

## 2024-10-06 MED ORDER — OXYCODONE HCL 5 MG/5ML PO SOLN: 5.0000 mg | Freq: Once | ORAL | Status: DC | PRN

## 2024-10-06 MED ORDER — PROPOFOL 10 MG/ML IV BOLUS
INTRAVENOUS | Status: DC | PRN
  Administered 2024-10-06: 200 mg via INTRAVENOUS

## 2024-10-06 MED ORDER — HYDRALAZINE HCL 20 MG/ML IJ SOLN
10.0000 mg | Freq: Once | INTRAMUSCULAR | Status: AC | PRN
  Administered 2024-10-06: 10 mg via INTRAVENOUS
  Filled 2024-10-06: qty 1

## 2024-10-06 MED ORDER — CEFAZOLIN SODIUM-DEXTROSE 2-4 GM/100ML-% IV SOLN
INTRAVENOUS | Status: AC
  Filled 2024-10-06: qty 100

## 2024-10-06 MED ORDER — LACTATED RINGERS IV SOLN: INTRAVENOUS | Status: DC

## 2024-10-06 MED ORDER — HYDROXYZINE HCL 25 MG PO TABS
25.0000 mg | ORAL_TABLET | Freq: Three times a day (TID) | ORAL | Status: DC | PRN
  Administered 2024-10-06: 25 mg via ORAL
  Filled 2024-10-06: qty 1

## 2024-10-06 MED ORDER — AMLODIPINE BESYLATE 5 MG PO TABS
5.0000 mg | ORAL_TABLET | Freq: Once | ORAL | Status: AC
  Administered 2024-10-06: 5 mg via ORAL
  Filled 2024-10-06: qty 1

## 2024-10-06 MED ORDER — STERILE WATER FOR IRRIGATION IR SOLN
Status: DC | PRN
  Administered 2024-10-06: 3000 mL

## 2024-10-06 MED ORDER — PROPOFOL 10 MG/ML IV BOLUS
INTRAVENOUS | Status: AC
  Filled 2024-10-06: qty 20

## 2024-10-06 MED ORDER — MIDAZOLAM HCL 2 MG/2ML IJ SOLN
INTRAMUSCULAR | Status: AC
  Filled 2024-10-06: qty 2

## 2024-10-06 MED ORDER — OXYCODONE HCL 5 MG PO TABS: 5.0000 mg | ORAL_TABLET | Freq: Once | ORAL | Status: DC | PRN

## 2024-10-06 MED ORDER — AMLODIPINE BESYLATE 10 MG PO TABS
10.0000 mg | ORAL_TABLET | Freq: Every day | ORAL | Status: DC
Start: 2024-10-07 — End: 2024-10-07

## 2024-10-06 MED ORDER — ACETAMINOPHEN 500 MG PO TABS
ORAL_TABLET | ORAL | Status: AC
  Filled 2024-10-06: qty 2

## 2024-10-06 MED ORDER — MIDAZOLAM HCL 5 MG/5ML IJ SOLN
INTRAMUSCULAR | Status: DC | PRN
  Administered 2024-10-06: 2 mg via INTRAVENOUS

## 2024-10-06 MED ORDER — FENTANYL CITRATE (PF) 50 MCG/ML IJ SOSY: 25.0000 ug | PREFILLED_SYRINGE | INTRAMUSCULAR | Status: DC | PRN

## 2024-10-06 MED ORDER — DROPERIDOL 2.5 MG/ML IJ SOLN: 0.6250 mg | Freq: Once | INTRAMUSCULAR | Status: DC | PRN

## 2024-10-06 MED ORDER — DEXAMETHASONE SOD PHOSPHATE PF 10 MG/ML IJ SOLN
INTRAMUSCULAR | Status: DC | PRN
  Administered 2024-10-06: 4 mg via INTRAVENOUS

## 2024-10-06 MED ORDER — ONDANSETRON HCL 4 MG/2ML IJ SOLN
INTRAMUSCULAR | Status: AC
  Filled 2024-10-06: qty 2

## 2024-10-06 MED ORDER — HYDRALAZINE HCL 25 MG PO TABS
25.0000 mg | ORAL_TABLET | Freq: Four times a day (QID) | ORAL | Status: DC | PRN
  Administered 2024-10-06: 25 mg via ORAL
  Filled 2024-10-06 (×2): qty 1

## 2024-10-06 MED ORDER — CHLORHEXIDINE GLUCONATE 0.12 % MT SOLN
15.0000 mL | Freq: Once | OROMUCOSAL | Status: AC
Start: 2024-10-06 — End: 2024-10-06
  Administered 2024-10-06: 15 mL via OROMUCOSAL

## 2024-10-06 SURGICAL SUPPLY — 13 items
BAG URINE DRAIN 2000ML AR STRL (UROLOGICAL SUPPLIES) IMPLANT
BAG URO CATCHER STRL LF (MISCELLANEOUS) ×1 IMPLANT
DRAPE FOOT SWITCH (DRAPES) ×1 IMPLANT
ELECT REM PT RETURN 15FT ADLT (MISCELLANEOUS) ×1 IMPLANT
GLOVE SURG LX STRL 7.5 STRW (GLOVE) ×1 IMPLANT
GOWN STRL REUS W/ TWL XL LVL3 (GOWN DISPOSABLE) ×1 IMPLANT
KIT TURNOVER KIT A (KITS) ×1 IMPLANT
LOOP CUT BIPOLAR 24F LRG (ELECTROSURGICAL) IMPLANT
MANIFOLD NEPTUNE II (INSTRUMENTS) ×1 IMPLANT
PACK CYSTO (CUSTOM PROCEDURE TRAY) ×1 IMPLANT
SYRINGE TOOMEY IRRIG 70ML (MISCELLANEOUS) IMPLANT
TUBING CONNECTING 10 (TUBING) ×1 IMPLANT
TUBING UROLOGY SET (TUBING) ×1 IMPLANT

## 2024-10-06 NOTE — Anesthesia Procedure Notes (Signed)
 Procedure Name: LMA Insertion Date/Time: 10/06/2024 5:37 PM  Performed by: Gladis Honey, CRNAPre-anesthesia Checklist: Patient identified, Emergency Drugs available, Suction available and Patient being monitored Patient Re-evaluated:Patient Re-evaluated prior to induction Oxygen  Delivery Method: Circle System Utilized Preoxygenation: Pre-oxygenation with 100% oxygen  Induction Type: IV induction Ventilation: Mask ventilation without difficulty LMA: LMA inserted LMA Size: 4.0 Number of attempts: 1 Airway Equipment and Method: Bite block Placement Confirmation: positive ETCO2 Tube secured with: Tape Dental Injury: Teeth and Oropharynx as per pre-operative assessment

## 2024-10-06 NOTE — Plan of Care (Signed)
  Problem: Education: Goal: Knowledge of General Education information will improve Description: Including pain rating scale, medication(s)/side effects and non-pharmacologic comfort measures Outcome: Progressing   Problem: Clinical Measurements: Goal: Ability to maintain clinical measurements within normal limits will improve Outcome: Progressing Goal: Will remain free from infection Outcome: Progressing Goal: Diagnostic test results will improve Outcome: Progressing Goal: Cardiovascular complication will be avoided Outcome: Progressing   Problem: Activity: Goal: Risk for activity intolerance will decrease Outcome: Progressing   Problem: Nutrition: Goal: Adequate nutrition will be maintained Outcome: Progressing   Problem: Elimination: Goal: Will not experience complications related to bowel motility Outcome: Progressing Goal: Will not experience complications related to urinary retention Outcome: Progressing   Problem: Coping: Goal: Level of anxiety will decrease Outcome: Not Progressing

## 2024-10-06 NOTE — Progress Notes (Signed)
 PROGRESS NOTE    Erik Gill  FMW:[id number] DOB: 1961/08/30 DOA: 10/04/2024 PCP: Arloa Elsie SAUNDERS, MD   Brief Narrative:  63 year old male with history of hypertension, hyperlipidemia, prostate cancer, hematuria, urinary retention, tobacco abuse presented with hematuria with small clots, urinary retention and dysuria.  On presentation, UA showed hematuria.  WBC of 14.7, hemoglobin of 4.8, platelets of 393 with sodium of 121 and creatinine of 1.36.  LFTs were normal.  Nephrology and urology were consulted.  He was started on IV fluids and CBI.  He received 4 units packed red cells.  Assessment & Plan:   Acute on chronic blood loss anemia Gross hematuria Acute urinary retention Moderate bilateral hydronephrosis: Possibly obstructive History of prostate cancer - Presented with hemoglobin of 4.8 with hematuria and clots.  Received 4 units packed red cells.  Hemoglobin 9.4 this morning.  Monitor H&H.  Urology following: Currently on CBI.  Follow recommendations. - Continue Foley catheter - Patient continues to pass clots.  Will keep him n.p.o. in case patient needs urological procedure.  Hyponatremia -Possibly hypovolemic hyponatremia.  Sodium 121 on presentation.  -Nephrology signed off on 10/05/24.  Treated with IV fluids and subsequently discontinued.  Sodium 135 this morning  Acute metabolic acidosis -improving.  Monitor  Leukocytosis - Mild.  Monitor  AKI - Resolved.  Hypertension - Monitor blood pressure.  Continue metoprolol .  Benazepril  on hold.  Increase dose of amlodipine  to 10 g daily since blood pressures are extremely elevated  GERD -continue PPI  Tobacco abuse -Continue nicotine  patches as needed  Hyperlipidemia - Follow-up with PCP for treatment options   DVT prophylaxis: SCDs Code Status: Full Family Communication: None at bedside Disposition Plan: Status is:  inpatient because: Of severity of illness    Consultants: Urology/nephrology  Procedures:  None Antimicrobials: None   Subjective: Patient seen and examined at bedside.  No fever, abdominal pain or vomiting reported.  Continues to pass clots.   Objective: Vitals:   10/06/24 0351 10/06/24 0400 10/06/24 0500 10/06/24 0603  BP: (!) 195/86 (!) 180/77 (!) 182/90 (!) 169/116  Pulse: 71 70 71 72  Resp: 18 14 (!) 21 (!) 26  Temp:      TempSrc:      SpO2: 100% 100% 100% 100%  Weight:   84.6 kg   Height:        Intake/Output Summary (Last 24 hours) at 10/06/2024 0721 Last data filed at 10/06/2024 0640 Gross per 24 hour  Intake 540 ml  Output 52454 ml  Net -47005 ml   Filed Weights   10/04/24 1111 10/05/24 0306 10/06/24 0500  Weight: 86.1 kg 86.1 kg 84.6 kg    Examination:  General: On room air.  No distress ENT/neck: No thyromegaly.  JVD is not elevated  respiratory: Decreased breath sounds at bases bilaterally with some crackles; no wheezing  CVS: S1-S2 heard, rate controlled currently Abdominal: Soft, nontender, slightly distended; no organomegaly, bowel sounds are heard Extremities: Trace lower extremity edema; no cyanosis  CNS: Awake and alert.  No focal neurologic deficit.  Moves extremities Lymph: No obvious lymphadenopathy Skin: No obvious ecchymosis/lesions  psych: Flat affect.  Not agitated  musculoskeletal: No obvious joint swelling/deformity Genitourinary: Foley catheter is present     Data Reviewed: I have personally reviewed following labs and imaging studies  CBC: Recent Labs  Lab 10/04/24 0801 10/04/24 1717 10/05/24 0231 10/06/24 0308  WBC 14.7*  --  10.6* 12.3*  NEUTROABS 11.9*  --   --  10.0*  HGB 4.8*  6.8* 9.8* 9.4*  HCT 16.3* 22.1* 29.9* 31.2*  MCV 79.9*  --  81.7 85.7  PLT 393  --  353 397   Basic Metabolic Panel: Recent Labs  Lab 10/04/24 0801 10/04/24 1607 10/04/24 1946 10/04/24 2345 10/05/24 0231 10/06/24 0308  NA 121* 126* 127* 130* 133* 135  K 4.6  --   --   --  4.3 4.4  CL 88*  --   --   --  102 104  CO2 19*  --    --   --  21* 21*  GLUCOSE 131*  --   --   --  109* 108*  BUN 17  --   --   --  10 13  CREATININE 1.36*  --   --   --  1.09 1.05  CALCIUM 9.9  --   --   --  9.1 9.3  MG  --   --   --   --  2.4  --    GFR: Estimated Creatinine Clearance: 77.3 mL/min (by C-G formula based on SCr of 1.05 mg/dL). Liver Function Tests: Recent Labs  Lab 10/04/24 0801 10/05/24 0231  AST 22 20  ALT 15 13  ALKPHOS 76 77  BILITOT 0.3 1.0  PROT 6.7 6.6  ALBUMIN  4.3 4.1   No results for input(s): LIPASE, AMYLASE in the last 168 hours. No results for input(s): AMMONIA in the last 168 hours. Coagulation Profile: No results for input(s): INR, PROTIME in the last 168 hours. Cardiac Enzymes: No results for input(s): CKTOTAL, CKMB, CKMBINDEX, TROPONINI in the last 168 hours. BNP (last 3 results) No results for input(s): PROBNP in the last 8760 hours. HbA1C: No results for input(s): HGBA1C in the last 72 hours. CBG: No results for input(s): GLUCAP in the last 168 hours. Lipid Profile: No results for input(s): CHOL, HDL, LDLCALC, TRIG, CHOLHDL, LDLDIRECT in the last 72 hours. Thyroid Function Tests: No results for input(s): TSH, T4TOTAL, FREET4, T3FREE, THYROIDAB in the last 72 hours. Anemia Panel: No results for input(s): VITAMINB12, FOLATE, FERRITIN, TIBC, IRON, RETICCTPCT in the last 72 hours. Sepsis Labs: No results for input(s): PROCALCITON, LATICACIDVEN in the last 168 hours.  Recent Results (from the past 240 hours)  Urine Culture     Status: None   Collection Time: 10/04/24  8:41 AM   Specimen: Urine, Clean Catch  Result Value Ref Range Status   Specimen Description   Final    URINE, CLEAN CATCH Performed at [facility], [address], [place], [place] [address]    Special Requests   Final    NONE Performed at [facility], [address] [place], [place] [address]    Culture   Final    NO  GROWTH Performed at [facility], [address], [place], [place] [address]    Report Status 10/05/2024 FINAL  Final  MRSA Next Gen by PCR, Nasal     Status: None   Collection Time: 10/04/24 11:17 AM   Specimen: Nasal Mucosa; Nasal Swab  Result Value Ref Range Status   MRSA by PCR Next Gen NOT DETECTED NOT DETECTED Final    Comment: (NOTE) The GeneXpert MRSA Assay (FDA approved for NASAL specimens only), is one component of a comprehensive MRSA colonization surveillance program. It is not intended to diagnose MRSA infection nor to guide or monitor treatment for MRSA infections. Test performance is not FDA approved in patients less than 93 years old. Performed at [facility], [address], [place], [place] [address]          Radiology Studies: US  Renal Result Date: 10/04/2024 EXAM: US  Retroperitoneum Complete, Renal. CLINICAL HISTORY: Hematuria and possible clot retention. TECHNIQUE: Real-time ultrasound of the retroperitoneum (complete) with image documentation. COMPARISON: None provided. FINDINGS: RIGHT KIDNEY: Measures 10.5 x 6.0 x 6.3 cm. Moderate bilateral hydronephrosis is noted. No renal stone or mass visualized. LEFT KIDNEY: Measures 12.1 x 5.5 x 5.0 cm. Moderate bilateral hydronephrosis is noted. No renal stone or mass visualized. BLADDER: Ureteral jets are not visualized. Some debris is noted in the dependent portion of the urinary bladder. Mild urinary bladder distention is noted. IMPRESSION: 1. Moderate bilateral hydronephrosis. 2. Mild urinary bladder distention with dependent intravesical debris. Ureteral jets are not visualized, which may indicate reduced ureteral flow or obstruction. Electronically signed by: Lynwood Seip MD 10/04/2024 10:44 AM EDT RP Workstation: [id number]        Scheduled Meds:  amLODipine   5 mg Oral Daily   Chlorhexidine  Gluconate Cloth  6 each Topical Daily   ferrous sulfate   325 mg Oral Q breakfast   metoprolol   succinate  150 mg Oral q AM   pantoprazole   40 mg Oral Daily   Continuous Infusions:  sodium chloride  irrigation            Sophie Mao, MD [facility] 10/06/2024, 7:21 AM

## 2024-10-06 NOTE — Anesthesia Preprocedure Evaluation (Addendum)
 Anesthesia Evaluation  Patient identified by MRN, date of birth, ID band Patient awake    Reviewed: Allergy & Precautions, NPO status , Patient's Chart, lab work & pertinent test results  History of Anesthesia Complications Negative for: history of anesthetic complications  Airway Mallampati: III  TM Distance: >3 FB Neck ROM: Full    Dental no notable dental hx.    Pulmonary Current Smoker and Patient abstained from smoking.   Pulmonary exam normal        Cardiovascular hypertension, Pt. on medications Normal cardiovascular exam     Neuro/Psych  Headaches    GI/Hepatic Neg liver ROS,GERD  Medicated and Controlled,,  Endo/Other  negative endocrine ROS    Renal/GU negative Renal ROS   hematuria    Musculoskeletal negative musculoskeletal ROS (+)    Abdominal   Peds  Hematology  (+) Blood dyscrasia (Hgb 9.4), anemia   Anesthesia Other Findings Prostate ca  Reproductive/Obstetrics                              Anesthesia Physical Anesthesia Plan  ASA: 3  Anesthesia Plan: General   Post-op Pain Management: Tylenol  PO (pre-op)*   Induction: Intravenous  PONV Risk Score and Plan: 2 and Treatment may vary due to age or medical condition, Ondansetron , Dexamethasone  and Midazolam   Airway Management Planned: LMA  Additional Equipment: None  Intra-op Plan:   Post-operative Plan: Extubation in OR  Informed Consent: I have reviewed the patients History and Physical, chart, labs and discussed the procedure including the risks, benefits and alternatives for the proposed anesthesia with the patient or authorized representative who has indicated his/her understanding and acceptance.     Dental advisory given  Plan Discussed with: CRNA  Anesthesia Plan Comments:          Anesthesia Quick Evaluation

## 2024-10-06 NOTE — Progress Notes (Signed)
 Patient ID: Erik Gill, male   DOB: 11-May-1961, 63 y.o.   MRN: [id number]    Subjective: Pt admitted over the weekend for hematuria, significant anemia (Hgb 4.8), and retention.  S/P transfusion of 4 units.  Multiple rounds of clot irrigation yesterday and this morning.    S/P cysto/clot evacuation/fulguration on 10/6.  Urine remained clear for about 2 week before he began bleeding intermittently again around 10/20.  Still has about 3 weeks of hyperbaric oxygen  therapy.  Objective: Vital signs in last 24 hours: Temp:  [97.9 F (36.6 C)-99 F (37.2 C)] 97.9 F (36.6 C) (11/03 0800) Pulse Rate:  [55-76] 68 (11/03 0850) Resp:  [13-26] 15 (11/03 0815) BP: (151-224)/(61-158) 200/103 (11/03 1040) SpO2:  [99 %-100 %] 100 % (11/03 0815) Weight:  [84.6 kg] 84.6 kg (11/03 0500)  Intake/Output from previous day: 11/02 0701 - 11/03 0700 In: 540 [P.O.:540] Out: 52454 [Urine:47545] Intake/Output this shift: Total I/O In: -  Out: 2200 [Urine:2200]  Physical Exam:  General: Alert and oriented GU: Urine pink on moderately slow CBI drip  Lab Results: Recent Labs    10/04/24 1717 10/05/24 0231 10/06/24 0308  HGB 6.8* 9.8* 9.4*  HCT 22.1* 29.9* 31.2*   BMET Recent Labs    10/05/24 0231 10/06/24 0308  NA 133* 135  K 4.3 4.4  CL 102 104  CO2 21* 21*  GLUCOSE 109* 108*  BUN 10 13  CREATININE 1.09 1.05  CALCIUM 9.1 9.3     Studies/Results: No results found.  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 Elliott. Currently bleeding is not severe at all and Hgb stabilized.  Discussed alum irrigation vs observation vs cystoscopy with possible fulguration.  He wishes to proceed with the latter option and cystoscopy and fulguration this afternoon.  I discussed the potential benefits and risks of the procedure, side effects of the proposed treatment, the likelihood of the patient achieving the goals of the procedure, and any potential  problems that might occur during the procedure or recuperation. He gives consent.  Will remain NPO.    LOS: 1 day   [name] 10/06/2024, 10:50 AM

## 2024-10-06 NOTE — Op Note (Signed)
 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  Cystoscopy 2.  Clot evacuation 3.  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  He is currently undergoing hyperbaric oxygen  therapy for this problem.  He presented back to the emergency department this weekend with clot retention and was noted to have a hemoglobin of 4.8.  He was admitted for transfusion and management of his hematuria with continuous bladder irrigation.  It appears that his active bleeding has now stopped and his continuous bladder irrigation was able to be weaned.  However, he continues to have pinkish urine and we therefore reviewed options to try to address his current bleeding issue as well as to prevent future bleeding.  After a long discussion, he did wish to proceed with cystoscopy, clot evacuation, and fulguration of any bleeding sites.  The potential risks, complications, and the expected recovery process was discussed in detail.  Informed consent was obtained.  Description of procedure: The patient was taken to the operating room and a general anesthetic was administered.  He was placed in the dorsolithotomy position, and prepped and draped in the usual sterile fashion.  Next, preoperative timeout was performed.  Cystourethroscopy was then performed with a 22 French cystoscope sheath.  Inspection of the bladder did reveal a few medium size clots which were able to be removed from the bladder.  There did not appear to be any significant active bleeding although there was some oozing from the bladder neck.  Using a Bugbee electrode, these areas were  selectively cauterized until no active bleeding was noted.  It was felt that it would be beneficial to leave him without a catheter considering the irritation that may be caused by the catheter in this situation.  He was therefore able to be awakened and transferred the recovery unit in satisfactory condition.

## 2024-10-06 NOTE — Transfer of Care (Signed)
 Immediate Anesthesia Transfer of Care Note  Patient: Erik Gill  Procedure(s) Performed: CYSTOSCOPY, WITH BLADDER FULGURATION (Bladder)  Patient Location: PACU  Anesthesia Type:General  Level of Consciousness: awake, alert , and oriented  Airway & Oxygen  Therapy: Patient Spontanous Breathing and Patient connected to face mask oxygen   Post-op Assessment: Report given to RN and Post -op Vital signs reviewed and stable  Post vital signs: Reviewed and stable  Last Vitals:  Vitals Value Taken Time  BP 120/68   Temp    Pulse 60 10/06/24 17:38  Resp 20 10/06/24 17:38  SpO2 100 % 10/06/24 17:38  Vitals shown include unfiled device data.  Last Pain:  Vitals:   10/06/24 1524  TempSrc: Oral  PainSc: 0-No pain      Patients Stated Pain Goal: 0 (10/06/24 1208)  Complications: No notable events documented.

## 2024-10-06 NOTE — Progress Notes (Signed)
 Patient was noted to be increasingly more hypertensive above 180s SBP and provider Lynwood Kipper NP was made aware. One time PRN dose of hydralazine 10mg  ordered. Nicotine  patch also offered to patient for history of smoking and no cigarettes in 5 days. Patient stated that he did not want to take the dose of hydralazine and requested that BP be retaken, accepted the nicotine  patch. Recheck of BP 195/86 at 0351. Patient stated that he would be fine without the hydralazine and that the nicotine  patch was enough. Nurse explained that the nicotine  patch would not have a quick enough onset to compensate for elevated BP if related to nicotine  withdrawal and educated patient on increased risk of stroke should patient remain above 180s SBP. Patient refused hydralazine, Lynwood Kipper NP aware.

## 2024-10-07 ENCOUNTER — Other Ambulatory Visit (HOSPITAL_COMMUNITY): Payer: Self-pay

## 2024-10-07 ENCOUNTER — Encounter (HOSPITAL_COMMUNITY): Payer: Self-pay | Admitting: Urology

## 2024-10-07 ENCOUNTER — Encounter (HOSPITAL_BASED_OUTPATIENT_CLINIC_OR_DEPARTMENT_OTHER): Admitting: Internal Medicine

## 2024-10-07 DIAGNOSIS — R310 Gross hematuria: Secondary | ICD-10-CM | POA: Diagnosis not present

## 2024-10-07 LAB — BASIC METABOLIC PANEL WITH GFR
Anion gap: 11 (ref 5–15)
BUN: 15 mg/dL (ref 8–23)
CO2: 21 mmol/L — ABNORMAL LOW (ref 22–32)
Calcium: 9.1 mg/dL (ref 8.9–10.3)
Chloride: 100 mmol/L (ref 98–111)
Creatinine, Ser: 1.49 mg/dL — ABNORMAL HIGH (ref 0.61–1.24)
GFR, Estimated: 53 mL/min — ABNORMAL LOW (ref >60)
Glucose, Bld: 142 mg/dL — ABNORMAL HIGH (ref 70–99)
Potassium: 5 mmol/L (ref 3.5–5.1)
Sodium: 132 mmol/L — ABNORMAL LOW (ref 135–145)

## 2024-10-07 LAB — CBC WITH DIFFERENTIAL/PLATELET
Abs Immature Granulocytes: 0.05 K/uL (ref 0.00–0.07)
Basophils Absolute: 0 K/uL (ref 0.0–0.1)
Basophils Relative: 0 %
Eosinophils Absolute: 0 K/uL (ref 0.0–0.5)
Eosinophils Relative: 0 %
HCT: 31.4 % — ABNORMAL LOW (ref 39.0–52.0)
Hemoglobin: 9.6 g/dL — ABNORMAL LOW (ref 13.0–17.0)
Immature Granulocytes: 1 %
Lymphocytes Relative: 4 %
Lymphs Abs: 0.5 K/uL — ABNORMAL LOW (ref 0.7–4.0)
MCH: 26.7 pg (ref 26.0–34.0)
MCHC: 30.6 g/dL (ref 30.0–36.0)
MCV: 87.5 fL (ref 80.0–100.0)
Monocytes Absolute: 0.5 K/uL (ref 0.1–1.0)
Monocytes Relative: 5 %
Neutro Abs: 9.4 K/uL — ABNORMAL HIGH (ref 1.7–7.7)
Neutrophils Relative %: 90 %
Platelets: 387 K/uL (ref 150–400)
RBC: 3.59 MIL/uL — ABNORMAL LOW (ref 4.22–5.81)
RDW: 16.4 % — ABNORMAL HIGH (ref 11.5–15.5)
Smear Review: NORMAL
WBC: 10.4 K/uL (ref 4.0–10.5)
nRBC: 0 % (ref 0.0–0.2)

## 2024-10-07 LAB — MAGNESIUM: Magnesium: 2.6 mg/dL — ABNORMAL HIGH (ref 1.7–2.4)

## 2024-10-07 MED ORDER — AMLODIPINE BESYLATE 10 MG PO TABS
10.0000 mg | ORAL_TABLET | Freq: Every day | ORAL | 0 refills | Status: AC
  Filled 2024-10-07: qty 30, 30d supply, fill #0

## 2024-10-07 MED ORDER — AMLODIPINE BESYLATE 5 MG PO TABS
5.0000 mg | ORAL_TABLET | Freq: Every day | ORAL | Status: DC
  Administered 2024-10-07: 5 mg via ORAL
  Filled 2024-10-07: qty 1

## 2024-10-07 MED ORDER — METOPROLOL SUCCINATE ER 100 MG PO TB24: 150.0000 mg | ORAL_TABLET | Freq: Every morning | ORAL | Status: AC

## 2024-10-07 NOTE — Anesthesia Postprocedure Evaluation (Signed)
 Anesthesia Post Note  Patient: Erik Gill  Procedure(s) Performed: CYSTOSCOPY, WITH BLADDER FULGURATION (Bladder)     Patient location during evaluation: PACU Anesthesia Type: General Level of consciousness: awake and alert Pain management: pain level controlled Vital Signs Assessment: post-procedure vital signs reviewed and stable Respiratory status: spontaneous breathing, nonlabored ventilation and respiratory function stable Cardiovascular status: blood pressure returned to baseline Postop Assessment: no apparent nausea or vomiting Anesthetic complications: no   No notable events documented.        Vertell Row

## 2024-10-07 NOTE — Progress Notes (Signed)
 Patient ID: Erik Gill, male   DOB: October 22, 1961, 63 y.o.   MRN: [id number]  1 Day Post-Op Subjective: Pt has been voiding clear since procedure last evening.   Objective: Vital signs in last 24 hours: Temp:  [97.5 F (36.4 C)-98.3 F (36.8 C)] 97.9 F (36.6 C) (11/04 0300) Pulse Rate:  [55-69] 55 (11/04 0600) Resp:  [14-29] 15 (11/04 0600) BP: (121-200)/(64-103) 160/79 (11/04 0600) SpO2:  [97 %-100 %] 99 % (11/04 0600) Weight:  [82 kg] 82 kg (11/04 0424)  Intake/Output from previous day: 11/03 0701 - 11/04 0700 In: 830 [P.O.:480; I.V.:300; IV Piggyback:50] Out: 3075 [Urine:3075] Intake/Output this shift: No intake/output data recorded.  Physical Exam:  General: Alert and oriented GU: Urine grossly clear in urinal  Lab Results: Recent Labs    10/05/24 0231 10/06/24 0308 10/07/24 0324  HGB 9.8* 9.4* 9.6*  HCT 29.9* 31.2* 31.4*   BMET Recent Labs    10/06/24 0308 10/07/24 0324  NA 135 132*  K 4.4 5.0  CL 104 100  CO2 21* 21*  GLUCOSE 108* 142*  BUN 13 15  CREATININE 1.05 1.49*  CALCIUM 9.3 9.1     Studies/Results: No results found.  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  Doing well this morning.  Hgb stable.  Ok to discharge from a urologic perspective.  He will resume hyperbaric oxygen  therapy the day after hospital discharge (hopefully can resume tomorrow if able to be discharged today).   LOS: 2 days   [name] 10/07/2024, 7:29 AM

## 2024-10-07 NOTE — Discharge Summary (Signed)
 Physician Discharge Summary  Erik Gill FMW:[id number] DOB: 06-10-1961 DOA: 10/04/2024  PCP: Arloa Elsie SAUNDERS, MD  Admit date: 10/04/2024 Discharge date: 10/07/2024  Admitted From: Home Disposition: Home  Recommendations for Outpatient Follow-up:  Follow up with PCP in 1 week with repeat CBC/BMP Outpatient follow-up with urology Follow up in ED if symptoms worsen or new appear   Home Health: No Equipment/Devices: None  Discharge Condition: Stable CODE STATUS: Full Diet recommendation: Heart healthy  Brief/Interim Summary: 63 year old male with history of hypertension, hyperlipidemia, prostate cancer, hematuria, urinary retention, tobacco abuse presented with hematuria with small clots, urinary retention and dysuria. On presentation, UA showed hematuria. WBC of 14.7, hemoglobin of 4.8, platelets of 393 with sodium of 121 and creatinine of 1.36. LFTs were normal. Nephrology and urology were consulted. He was started on IV fluids and CBI. He received 4 units packed red cells.  During the hospitalization, patient underwent cystoscopy, clot evacuation and fulguration of bladder on 10/06/2024.  Subsequently, Foley catheter was removed and hematuria is improving.  Urology has cleared the patient for discharge.  He will be discharged home today with outpatient follow-up with PCP and urology.  Discharge Diagnoses:   Acute on chronic blood loss anemia Gross hematuria Acute urinary retention: Resolved Moderate bilateral hydronephrosis: Possibly obstructive History of prostate cancer - Presented with hemoglobin of 4.8 with hematuria and clots.  Received 4 units packed red cells.  Hemoglobin 9.6 this morning.  Urology following: Treated with CBI.  Follow recommendations. - underwent cystoscopy, clot evacuation and fulguration of bladder on 10/06/2024.  Subsequently, Foley catheter was removed and hematuria is improving.  Urology has cleared the patient for discharge.  He will be discharged home  today with outpatient follow-up with PCP and urology.   Hyponatremia -Possibly hypovolemic hyponatremia.  Sodium 121 on presentation.  -Nephrology signed off on 10/05/24.  Treated with IV fluids and subsequently discontinued.  Sodium 132 this morning.  Outpatient follow-up   Acute metabolic acidosis - Mild.  Monitor intermittently as an outpatient.    Leukocytosis - Resolved  AKI - Creatinine slightly uptrending to 1.49 today.  Encourage oral intake and hydration.  Will continue to keep benazepril  on hold till reevaluation by PCP.  Outpatient follow-up of BMP within a week.   Hypertension - Continue metoprolol .  Benazepril  on hold.  Continue amlodipine .  GERD -continue PPI   Tobacco abuse -Continue nicotine  patches as needed   Hyperlipidemia - Follow-up with PCP for treatment options     Discharge Instructions  Discharge Instructions     Diet - low sodium heart healthy   Complete by: As directed    Increase activity slowly   Complete by: As directed    No wound care   Complete by: As directed       Allergies as of 10/07/2024   No Known Allergies      Medication List     STOP taking these medications    amLODipine -benazepril  10-40 MG capsule Commonly known as: LOTREL   fluticasone  50 MCG/ACT nasal spray Commonly known as: FLONASE    Lidocaine  4 % Gel   ofloxacin 0.3 % OTIC solution Commonly known as: FLOXIN   ondansetron  4 MG tablet Commonly known as: ZOFRAN    tranexamic acid  650 MG Tabs tablet Commonly known as: LYSTEDA        TAKE these medications    acetaminophen  500 MG tablet Commonly known as: TYLENOL  Take 500 mg by mouth every 6 (six) hours as needed.   albuterol  108 (90 Base) MCG/ACT inhaler  Commonly known as: VENTOLIN  HFA Inhale 2 puffs into the lungs every 4 (four) hours as needed for wheezing or shortness of breath.   amLODipine  10 MG tablet Commonly known as: NORVASC  Take 1 tablet (10 mg total) by mouth daily.   ferrous  sulfate 325 (65 FE) MG EC tablet Take 325 mg by mouth daily with breakfast.   metoprolol  succinate 100 MG 24 hr tablet Commonly known as: TOPROL -XL Take 1.5 tablets (150 mg total) by mouth in the morning.   nicotine  14 mg/24hr patch Commonly known as: NICODERM CQ  - dosed in mg/24 hours Place 1 patch (14 mg total) onto the skin daily. What changed:  when to take this reasons to take this   Opcon-A  0.027-0.315 % Soln Generic drug: Naphazoline-Pheniramine Place 1 drop into both eyes daily as needed (allergies).   pantoprazole  40 MG tablet Commonly known as: PROTONIX  Take 40 mg by mouth daily as needed.   ZyrTEC Allergy 10 MG tablet Generic drug: cetirizine Take 10 mg by mouth in the morning.        Follow-up Information     Arloa Elsie SAUNDERS, MD. Schedule an appointment as soon as possible for a visit in 1 week(s).   Specialty: Family Medicine Why: with repeat cbc/bmp Contact information: [address] [place] [address] [phone]         Renda Glance, MD. Schedule an appointment as soon as possible for a visit in 1 week(s).   Specialty: Urology Contact information: [address] [place] [address] [phone]                No Known Allergies  Consultations: Urology   Procedures/Studies: US  Renal Result Date: 10/04/2024 EXAM: US  Retroperitoneum Complete, Renal. CLINICAL HISTORY: Hematuria and possible clot retention. TECHNIQUE: Real-time ultrasound of the retroperitoneum (complete) with image documentation. COMPARISON: None provided. FINDINGS: RIGHT KIDNEY: Measures 10.5 x 6.0 x 6.3 cm. Moderate bilateral hydronephrosis is noted. No renal stone or mass visualized. LEFT KIDNEY: Measures 12.1 x 5.5 x 5.0 cm. Moderate bilateral hydronephrosis is noted. No renal stone or mass visualized. BLADDER: Ureteral jets are not visualized. Some debris is noted in the dependent portion of the urinary bladder. Mild urinary bladder distention  is noted. IMPRESSION: 1. Moderate bilateral hydronephrosis. 2. Mild urinary bladder distention with dependent intravesical debris. Ureteral jets are not visualized, which may indicate reduced ureteral flow or obstruction. Electronically signed by: Lynwood Seip MD 10/04/2024 10:44 AM EDT RP Workstation: [id number]   VAS US  CAROTID Result Date: 09/26/2024 Carotid Arterial Duplex Study Patient Name:  Erik Gill  Date of Exam:   09/24/2024 Medical Rec #: [id number]      Accession #:    [id number] Date of Birth: Sep 04, 1961      Patient Gender: M Patient Age:   63 years Exam Location:  [address] Procedure:      VAS US  CAROTID Referring Phys: GEORGANNA ARCHER --------------------------------------------------------------------------------  Indications:       Left bruit and patient denies any cerebrovascular symptoms. Risk Factors:      Hypertension, hyperlipidemia, current smoker. Comparison Study:  NA Performing Technologist: Nanetta Shad RVT  Examination Guidelines: A complete evaluation includes B-mode imaging, spectral Doppler, color Doppler, and power Doppler as needed of all accessible portions of each vessel. Bilateral testing is considered an integral part of a complete examination. Limited examinations for reoccurring indications may be performed as noted.  Right Carotid Findings: +----------+--------+--------+--------+-----------------------+--------+           PSV  cm/sEDV cm/sStenosisPlaque Description     Comments +----------+--------+--------+--------+-----------------------+--------+ CCA Prox  82      12                                              +----------+--------+--------+--------+-----------------------+--------+ CCA Distal62      14                                              +----------+--------+--------+--------+-----------------------+--------+ ICA Prox  56      14      1-39%   heterogenous and smooth          +----------+--------+--------+--------+-----------------------+--------+ ICA Distal67      27                                              +----------+--------+--------+--------+-----------------------+--------+ ECA       103     9                                               +----------+--------+--------+--------+-----------------------+--------+ +----------+--------+-------+----------------+-------------------+           PSV cm/sEDV cmsDescribe        Arm Pressure (mmHG) +----------+--------+-------+----------------+-------------------+ [id number]     0      Multiphasic, [id number]                 +----------+--------+-------+----------------+-------------------+ +---------+--------+--+--------+--+---------+ VertebralPSV cm/s53EDV cm/s17Antegrade +---------+--------+--+--------+--+---------+  Left Carotid Findings: +----------+--------+--------+--------+----------------------+--------------+           PSV cm/sEDV cm/sStenosisPlaque Description    Comments       +----------+--------+--------+--------+----------------------+--------------+ CCA Prox  73      14                                                   +----------+--------+--------+--------+----------------------+--------------+ CCA Distal55      17                                                   +----------+--------+--------+--------+----------------------+--------------+ ICA Prox  311     100     60-79%  heterogenous and focalhigh end range +----------+--------+--------+--------+----------------------+--------------+ ICA Distal67      23                                    tortuous       +----------+--------+--------+--------+----------------------+--------------+ ECA       255     13      >50%    heterogenous                         +----------+--------+--------+--------+----------------------+--------------+ +----------+--------+--------+----------------+-------------------+  PSV cm/sEDV cm/sDescribe        Arm Pressure (mmHG) +----------+--------+--------+----------------+-------------------+ [id number]      0       Multiphasic, TWO842                 +----------+--------+--------+----------------+-------------------+ +---------+--------+--+--------+--+---------+ VertebralPSV cm/s67EDV cm/s20Antegrade +---------+--------+--+--------+--+---------+   Summary: Right Carotid: Velocities in the right ICA are consistent with a 1-39% stenosis. Left Carotid: Velocities in the left ICA are consistent with a 60-79% stenosis,               high end range. The ECA appears >50% stenosed. Vertebrals:  Bilateral vertebral arteries demonstrate antegrade flow. Subclavians: Normal flow hemodynamics were seen in bilateral subclavian              arteries. *See table(s) above for measurements and observations. Suggest follow up study in 12 months. Electronically signed by Dorn Ross MD on 09/26/2024 at 8:18:46 AM.    Final    ECHOCARDIOGRAM COMPLETE Result Date: 09/23/2024    ECHOCARDIOGRAM REPORT   Patient Name:   Erik Gill Date of Exam: 09/23/2024 Medical Rec #:  [id number]     Height:       68.0 in Accession #:    [id number]    Weight:       187.4 lb Date of Birth:  Mar 03, 1961     BSA:          1.988 m Patient Age:    63 years      BP:           122/60 mmHg Patient Gender: M             HR:           76 bpm. Exam Location:  [address] Procedure: 2D Echo, Cardiac Doppler and Color Doppler (Both Spectral and Color            Flow Doppler were utilized during procedure). Indications:    Z01.818 Preoperative examination  History:        Patient has no prior history of Echocardiogram examinations.                 Risk Factors:Hypertension.  Sonographer:    Carl Coma RDCS Referring Phys: [id number] GEORGANNA ARCHER IMPRESSIONS  1. Left ventricular ejection fraction, by estimation, is 65 to 70%. Left ventricular ejection fraction by PLAX is 70 %. The left  ventricle has normal function. The left ventricle has no regional wall motion abnormalities. There is mild left ventricular hypertrophy. Left ventricular diastolic parameters are consistent with Grade I diastolic dysfunction (impaired relaxation).  2. Right ventricular systolic function is normal. The right ventricular size is normal. Tricuspid regurgitation signal is inadequate for assessing PA pressure.  3. The mitral valve is abnormal. Trivial mitral valve regurgitation.  4. The aortic valve is tricuspid. Aortic valve regurgitation is not visualized. Aortic valve sclerosis/calcification is present, without any evidence of aortic stenosis.  5. The inferior vena cava is normal in size with greater than 50% respiratory variability, suggesting right atrial pressure of 3 mmHg. Comparison(s): No prior Echocardiogram. FINDINGS  Left Ventricle: Left ventricular ejection fraction, by estimation, is 65 to 70%. Left ventricular ejection fraction by PLAX is 70 %. The left ventricle has normal function. The left ventricle has no regional wall motion abnormalities. The left ventricular internal cavity size was normal in size. There is mild left ventricular hypertrophy. Left ventricular diastolic parameters are consistent with Grade I diastolic dysfunction (impaired relaxation). Indeterminate filling pressures. Right Ventricle:  The right ventricular size is normal. No increase in right ventricular wall thickness. Right ventricular systolic function is normal. Tricuspid regurgitation signal is inadequate for assessing PA pressure. Left Atrium: Left atrial size was normal in size. Right Atrium: Right atrial size was normal in size. Pericardium: There is no evidence of pericardial effusion. Mitral Valve: The mitral valve is abnormal. There is mild thickening of the anterior and posterior mitral valve leaflet(s). Trivial mitral valve regurgitation. Tricuspid Valve: The tricuspid valve is grossly normal. Tricuspid valve  regurgitation is trivial. The aortic valve is tricuspid. Aortic valve regurgitation is not visualized. Aortic valve sclerosis/calcification is present, without any evidence of aortic stenosis. Pulmonic Valve: The pulmonic valve was grossly normal. Pulmonic valve regurgitation is trivial. Aorta: The aortic root and ascending aorta are structurally normal, with no evidence of dilitation. Venous: The inferior vena cava is normal in size with greater than 50% respiratory variability, suggesting right atrial pressure of 3 mmHg. IAS/Shunts: No atrial level shunt detected by color flow Doppler.  LEFT VENTRICLE PLAX 2D LV EF:         Left            Diastology                ventricular     LV e' medial:    6.52 cm/s                ejection        LV E/e' medial:  12.2                fraction by     LV e' lateral:   9.95 cm/s                PLAX is 70      LV E/e' lateral: 8.0                %. LVIDd:         4.80 cm LVIDs:         2.90 cm LV PW:         1.20 cm LV IVS:        1.20 cm LVOT diam:     2.20 cm LV SV:         91 LV SV Index:   46 LVOT Area:     3.80 cm  RIGHT VENTRICLE RV Basal diam:  3.60 cm RV S prime:     14.87 cm/s TAPSE (M-mode): 2.8 cm LEFT ATRIUM             Index        RIGHT ATRIUM           Index LA diam:        4.80 cm 2.41 cm/m   RA Area:     16.90 cm LA Vol (A2C):   79.2 ml 39.84 ml/m  RA Volume:   47.60 ml  23.94 ml/m LA Vol (A4C):   52.1 ml 26.21 ml/m LA Biplane Vol: 66.5 ml 33.45 ml/m  AORTIC VALVE AV Area (Vmax):    2.42 cm AV Area (Vmean):   2.34 cm AV Area (VTI):     2.34 cm AV Vmax:           173.00 cm/s AV Vmean:          117.000 cm/s AV VTI:            0.389 m AV Peak Grad:      12.0 mmHg  AV Mean Grad:      6.0 mmHg LVOT Vmax:         110.00 cm/s LVOT Vmean:        72.050 cm/s LVOT VTI:          0.239 m LVOT/AV VTI ratio: 0.61  AORTA Ao Root diam: 3.40 cm Ao Asc diam:  3.50 cm MITRAL VALVE MV Area (PHT): 3.59 cm    SHUNTS MV Decel Time: 212 msec    Systemic VTI:  0.24 m MV E  velocity: 79.40 cm/s  Systemic Diam: 2.20 cm MV A velocity: 99.95 cm/s MV E/A ratio:  0.79 Vinie Maxcy MD Electronically signed by Vinie Maxcy MD Signature Date/Time: 09/23/2024/11:12:52 AM    Final       Subjective: Patient seen and examined at bedside.  Feels okay to go home today.  Denies any more blood in the urine.  No fever, vomiting, worsening abdominal reported.  Discharge Exam: Vitals:   10/07/24 0400 10/07/24 0600  BP:  (!) 160/79  Pulse: 66 (!) 55  Resp: (!) 29 15  Temp:    SpO2: 99% 99%    General: Pt is alert, awake, not in acute distress.  On room air. Cardiovascular: Mild intermittent bradycardia present, S1/S2 + Respiratory: bilateral decreased breath sounds at bases Abdominal: Soft, NT, ND, bowel sounds + Extremities: no edema, no cyanosis    The results of significant diagnostics from this hospitalization (including imaging, microbiology, ancillary and laboratory) are listed below for reference.     Microbiology: Recent Results (from the past 240 hours)  Urine Culture     Status: None   Collection Time: 10/04/24  8:41 AM   Specimen: Urine, Clean Catch  Result Value Ref Range Status   Specimen Description   Final    URINE, CLEAN CATCH Performed at [facility], [address], [place], [place] [address]    Special Requests   Final    NONE Performed at [facility], [address] [place], [place] [address]    Culture   Final    NO GROWTH Performed at [facility], [address], [place], [place] [address]    Report Status 10/05/2024 FINAL  Final  MRSA Next Gen by PCR, Nasal     Status: None   Collection Time: 10/04/24 11:17 AM   Specimen: Nasal Mucosa; Nasal Swab  Result Value Ref Range Status   MRSA by PCR Next Gen NOT DETECTED NOT DETECTED Final    Comment: (NOTE) The GeneXpert MRSA Assay (FDA approved for NASAL specimens only), is one component of a comprehensive MRSA colonization  surveillance program. It is not intended to diagnose MRSA infection nor to guide or monitor treatment for MRSA infections. Test performance is not FDA approved in patients less than 47 years old. Performed at [facility], [address] [place], [place] [address]      Labs: BNP (last 3 results) No results for input(s): BNP in the last 8760 hours. Basic Metabolic Panel: Recent Labs  Lab 10/04/24 0801 10/04/24 1607 10/04/24 1946 10/04/24 2345 10/05/24 0231 10/06/24 0308 10/07/24 0324  NA 121*   < > 127* 130* 133* 135 132*  K 4.6  --   --   --  4.3 4.4 5.0  CL 88*  --   --   --  102 104 100  CO2 19*  --   --   --  21* 21* 21*  GLUCOSE 131*  --   --   --  109* 108* 142*  BUN 17  --   --   --  10 13 15   CREATININE 1.36*  --   --   --  1.09 1.05 1.49*  CALCIUM 9.9  --   --   --  9.1 9.3 9.1  MG  --   --   --   --  2.4  --  2.6*   < > = values in this interval not displayed.   Liver Function Tests: Recent Labs  Lab 10/04/24 0801 10/05/24 0231  AST 22 20  ALT 15 13  ALKPHOS 76 77  BILITOT 0.3 1.0  PROT 6.7 6.6  ALBUMIN  4.3 4.1   No results for input(s): LIPASE, AMYLASE in the last 168 hours. No results for input(s): AMMONIA in the last 168 hours. CBC: Recent Labs  Lab 10/04/24 0801 10/04/24 1717 10/05/24 0231 10/06/24 0308 10/07/24 0324  WBC 14.7*  --  10.6* 12.3* 10.4  NEUTROABS 11.9*  --   --  10.0* 9.4*  HGB 4.8* 6.8* 9.8* 9.4* 9.6*  HCT 16.3* 22.1* 29.9* 31.2* 31.4*  MCV 79.9*  --  81.7 85.7 87.5  PLT 393  --  353 397 387   Cardiac Enzymes: No results for input(s): CKTOTAL, CKMB, CKMBINDEX, TROPONINI in the last 168 hours. BNP: Invalid input(s): POCBNP CBG: No results for input(s): GLUCAP in the last 168 hours. D-Dimer No results for input(s): DDIMER in the last 72 hours. Hgb A1c No results for input(s): HGBA1C in the last 72 hours. Lipid Profile No results for input(s): CHOL, HDL, LDLCALC, TRIG,  CHOLHDL, LDLDIRECT in the last 72 hours. Thyroid function studies No results for input(s): TSH, T4TOTAL, T3FREE, THYROIDAB in the last 72 hours.  Invalid input(s): FREET3 Anemia work up No results for input(s): VITAMINB12, FOLATE, FERRITIN, TIBC, IRON, RETICCTPCT in the last 72 hours. Urinalysis    Component Value Date/Time   COLORURINE RED (A) 10/04/2024 0841   APPEARANCEUR (A) 10/04/2024 0841    TEST NOT REPORTED DUE TO COLOR INTERFERENCE OF URINE PIGMENT   LABSPEC  10/04/2024 0841    TEST NOT REPORTED DUE TO COLOR INTERFERENCE OF URINE PIGMENT   PHURINE  10/04/2024 0841    TEST NOT REPORTED DUE TO COLOR INTERFERENCE OF URINE PIGMENT   GLUCOSEU (A) 10/04/2024 0841    TEST NOT REPORTED DUE TO COLOR INTERFERENCE OF URINE PIGMENT   HGBUR (A) 10/04/2024 0841    TEST NOT REPORTED DUE TO COLOR INTERFERENCE OF URINE PIGMENT   BILIRUBINUR (A) 10/04/2024 0841    TEST NOT REPORTED DUE TO COLOR INTERFERENCE OF URINE PIGMENT   BILIRUBINUR moderate (A) 02/19/2024 1149   KETONESUR (A) 10/04/2024 0841    TEST NOT REPORTED DUE TO COLOR INTERFERENCE OF URINE PIGMENT   PROTEINUR (A) 10/04/2024 0841    TEST NOT REPORTED DUE TO COLOR INTERFERENCE OF URINE PIGMENT   UROBILINOGEN 1.0 02/19/2024 1149   NITRITE (A) 10/04/2024 0841    TEST NOT REPORTED DUE TO COLOR INTERFERENCE OF URINE PIGMENT   LEUKOCYTESUR (A) 10/04/2024 0841    TEST NOT REPORTED DUE TO COLOR INTERFERENCE OF URINE PIGMENT   Sepsis Labs Recent Labs  Lab 10/04/24 0801 10/05/24 0231 10/06/24 0308 10/07/24 0324  WBC 14.7* 10.6* 12.3* 10.4   Microbiology Recent Results (from the past 240 hours)  Urine Culture     Status: None   Collection Time: 10/04/24  8:41 AM   Specimen: Urine, Clean Catch  Result Value Ref Range Status   Specimen Description  Final    URINE, CLEAN CATCH Performed at [facility], [address], [place], [place] [address]    Special Requests   Final     NONE Performed at [facility], [address], [place], [place] [address]    Culture   Final    NO GROWTH Performed at [facility], [address], [place], [place] [address]    Report Status 10/05/2024 FINAL  Final  MRSA Next Gen by PCR, Nasal     Status: None   Collection Time: 10/04/24 11:17 AM   Specimen: Nasal Mucosa; Nasal Swab  Result Value Ref Range Status   MRSA by PCR Next Gen NOT DETECTED NOT DETECTED Final    Comment: (NOTE) The GeneXpert MRSA Assay (FDA approved for NASAL specimens only), is one component of a comprehensive MRSA colonization surveillance program. It is not intended to diagnose MRSA infection nor to guide or monitor treatment for MRSA infections. Test performance is not FDA approved in patients less than 32 years old. Performed at [facility], [address], [place], [place] [address]      Time coordinating discharge: 35 minutes  SIGNED:   Sophie Mao, MD  [facility] 10/07/2024, 9:15 AM

## 2024-10-07 NOTE — Progress Notes (Signed)
 Discharge paperwork given to patient. He verbalized understanding of new medication prescription and need to create follow up appointments. PIV removed, patient getting dressed and states he will call a cab.

## 2024-10-07 NOTE — Plan of Care (Signed)
  Problem: Education: Goal: Knowledge of General Education information will improve Description: Including pain rating scale, medication(s)/side effects and non-pharmacologic comfort measures Outcome: Progressing   Problem: Health Behavior/Discharge Planning: Goal: Ability to manage health-related needs will improve Outcome: Progressing   Problem: Clinical Measurements: Goal: Will remain free from infection Outcome: Progressing   Problem: Pain Managment: Goal: General experience of comfort will improve and/or be controlled Outcome: Progressing

## 2024-10-08 ENCOUNTER — Encounter (HOSPITAL_BASED_OUTPATIENT_CLINIC_OR_DEPARTMENT_OTHER): Attending: Internal Medicine | Admitting: Internal Medicine

## 2024-10-08 DIAGNOSIS — C61 Malignant neoplasm of prostate: Secondary | ICD-10-CM | POA: Insufficient documentation

## 2024-10-08 DIAGNOSIS — N3041 Irradiation cystitis with hematuria: Secondary | ICD-10-CM | POA: Diagnosis not present

## 2024-10-09 ENCOUNTER — Encounter (HOSPITAL_BASED_OUTPATIENT_CLINIC_OR_DEPARTMENT_OTHER): Admitting: Internal Medicine

## 2024-10-09 DIAGNOSIS — C61 Malignant neoplasm of prostate: Secondary | ICD-10-CM

## 2024-10-09 DIAGNOSIS — N3041 Irradiation cystitis with hematuria: Secondary | ICD-10-CM | POA: Diagnosis not present

## 2024-10-10 ENCOUNTER — Encounter (HOSPITAL_BASED_OUTPATIENT_CLINIC_OR_DEPARTMENT_OTHER): Admitting: Internal Medicine

## 2024-10-10 DIAGNOSIS — N3041 Irradiation cystitis with hematuria: Secondary | ICD-10-CM

## 2024-10-10 DIAGNOSIS — C61 Malignant neoplasm of prostate: Secondary | ICD-10-CM | POA: Diagnosis not present

## 2024-10-13 ENCOUNTER — Encounter (HOSPITAL_BASED_OUTPATIENT_CLINIC_OR_DEPARTMENT_OTHER): Admitting: Internal Medicine

## 2024-10-13 DIAGNOSIS — N3041 Irradiation cystitis with hematuria: Secondary | ICD-10-CM | POA: Diagnosis not present

## 2024-10-13 DIAGNOSIS — C61 Malignant neoplasm of prostate: Secondary | ICD-10-CM

## 2024-10-14 ENCOUNTER — Encounter (HOSPITAL_BASED_OUTPATIENT_CLINIC_OR_DEPARTMENT_OTHER): Admitting: Internal Medicine

## 2024-10-14 DIAGNOSIS — C61 Malignant neoplasm of prostate: Secondary | ICD-10-CM

## 2024-10-14 DIAGNOSIS — N3041 Irradiation cystitis with hematuria: Secondary | ICD-10-CM | POA: Diagnosis not present

## 2024-10-14 MED ORDER — ATORVASTATIN CALCIUM 20 MG PO TABS: 20.0000 mg | ORAL_TABLET | Freq: Every day | ORAL | 3 refills | Status: AC

## 2024-10-15 ENCOUNTER — Encounter (HOSPITAL_BASED_OUTPATIENT_CLINIC_OR_DEPARTMENT_OTHER): Admitting: Internal Medicine

## 2024-10-15 DIAGNOSIS — N3041 Irradiation cystitis with hematuria: Secondary | ICD-10-CM | POA: Diagnosis not present

## 2024-10-15 DIAGNOSIS — C61 Malignant neoplasm of prostate: Secondary | ICD-10-CM | POA: Diagnosis not present

## 2024-10-16 ENCOUNTER — Encounter (HOSPITAL_BASED_OUTPATIENT_CLINIC_OR_DEPARTMENT_OTHER): Admitting: General Surgery

## 2024-10-16 DIAGNOSIS — N3041 Irradiation cystitis with hematuria: Secondary | ICD-10-CM | POA: Diagnosis not present

## 2024-10-17 ENCOUNTER — Other Ambulatory Visit (HOSPITAL_COMMUNITY): Payer: Self-pay

## 2024-10-17 ENCOUNTER — Encounter (HOSPITAL_BASED_OUTPATIENT_CLINIC_OR_DEPARTMENT_OTHER): Admitting: General Surgery

## 2024-10-17 DIAGNOSIS — N3041 Irradiation cystitis with hematuria: Secondary | ICD-10-CM | POA: Diagnosis not present

## 2024-10-20 ENCOUNTER — Encounter (HOSPITAL_BASED_OUTPATIENT_CLINIC_OR_DEPARTMENT_OTHER): Admitting: General Surgery

## 2024-10-20 DIAGNOSIS — N3041 Irradiation cystitis with hematuria: Secondary | ICD-10-CM | POA: Diagnosis not present

## 2024-10-21 ENCOUNTER — Encounter (HOSPITAL_BASED_OUTPATIENT_CLINIC_OR_DEPARTMENT_OTHER): Admitting: Internal Medicine

## 2024-10-21 DIAGNOSIS — N3041 Irradiation cystitis with hematuria: Secondary | ICD-10-CM

## 2024-10-21 DIAGNOSIS — C61 Malignant neoplasm of prostate: Secondary | ICD-10-CM | POA: Diagnosis not present

## 2024-10-22 ENCOUNTER — Encounter (HOSPITAL_BASED_OUTPATIENT_CLINIC_OR_DEPARTMENT_OTHER): Admitting: Internal Medicine

## 2024-10-22 DIAGNOSIS — N3041 Irradiation cystitis with hematuria: Secondary | ICD-10-CM

## 2024-10-22 DIAGNOSIS — C61 Malignant neoplasm of prostate: Secondary | ICD-10-CM | POA: Diagnosis not present

## 2024-10-23 ENCOUNTER — Encounter (HOSPITAL_BASED_OUTPATIENT_CLINIC_OR_DEPARTMENT_OTHER): Admitting: Internal Medicine

## 2024-10-23 DIAGNOSIS — C61 Malignant neoplasm of prostate: Secondary | ICD-10-CM | POA: Diagnosis not present

## 2024-10-23 DIAGNOSIS — N3041 Irradiation cystitis with hematuria: Secondary | ICD-10-CM

## 2024-10-24 ENCOUNTER — Encounter (HOSPITAL_BASED_OUTPATIENT_CLINIC_OR_DEPARTMENT_OTHER): Admitting: Internal Medicine

## 2024-10-24 DIAGNOSIS — C61 Malignant neoplasm of prostate: Secondary | ICD-10-CM | POA: Diagnosis not present

## 2024-10-24 DIAGNOSIS — N3041 Irradiation cystitis with hematuria: Secondary | ICD-10-CM

## 2024-10-27 ENCOUNTER — Encounter (HOSPITAL_BASED_OUTPATIENT_CLINIC_OR_DEPARTMENT_OTHER): Admitting: Internal Medicine

## 2024-10-27 DIAGNOSIS — N3041 Irradiation cystitis with hematuria: Secondary | ICD-10-CM | POA: Diagnosis not present

## 2024-10-27 DIAGNOSIS — C61 Malignant neoplasm of prostate: Secondary | ICD-10-CM

## 2024-10-28 ENCOUNTER — Encounter (HOSPITAL_BASED_OUTPATIENT_CLINIC_OR_DEPARTMENT_OTHER): Admitting: Internal Medicine

## 2024-10-28 DIAGNOSIS — C61 Malignant neoplasm of prostate: Secondary | ICD-10-CM

## 2024-10-28 DIAGNOSIS — N3041 Irradiation cystitis with hematuria: Secondary | ICD-10-CM | POA: Diagnosis not present

## 2024-10-29 ENCOUNTER — Encounter (HOSPITAL_BASED_OUTPATIENT_CLINIC_OR_DEPARTMENT_OTHER): Admitting: Internal Medicine

## 2024-10-29 DIAGNOSIS — N3041 Irradiation cystitis with hematuria: Secondary | ICD-10-CM

## 2024-10-29 DIAGNOSIS — C61 Malignant neoplasm of prostate: Secondary | ICD-10-CM | POA: Diagnosis not present

## 2024-11-03 ENCOUNTER — Encounter (HOSPITAL_BASED_OUTPATIENT_CLINIC_OR_DEPARTMENT_OTHER): Admitting: Internal Medicine

## 2024-11-03 DIAGNOSIS — N3041 Irradiation cystitis with hematuria: Secondary | ICD-10-CM | POA: Insufficient documentation

## 2024-11-03 DIAGNOSIS — C61 Malignant neoplasm of prostate: Secondary | ICD-10-CM | POA: Diagnosis not present

## 2024-11-04 ENCOUNTER — Encounter (HOSPITAL_BASED_OUTPATIENT_CLINIC_OR_DEPARTMENT_OTHER): Admitting: Internal Medicine

## 2024-11-19 ENCOUNTER — Other Ambulatory Visit: Payer: Self-pay | Admitting: Student in an Organized Health Care Education/Training Program
